# Patient Record
Sex: Female | Born: 1960 | Race: Black or African American | Hispanic: No | Smoking: Never smoker
Health system: Southern US, Community
[De-identification: ages and names within clinical notes are randomized; demographics above are authoritative.]

## PROBLEM LIST (undated history)

## (undated) DIAGNOSIS — R232 Flushing: Secondary | ICD-10-CM

## (undated) DIAGNOSIS — E7800 Pure hypercholesterolemia, unspecified: Secondary | ICD-10-CM

## (undated) DIAGNOSIS — M1990 Unspecified osteoarthritis, unspecified site: Secondary | ICD-10-CM

## (undated) DIAGNOSIS — K635 Polyp of colon: Secondary | ICD-10-CM

## (undated) DIAGNOSIS — R413 Other amnesia: Secondary | ICD-10-CM

## (undated) DIAGNOSIS — E039 Hypothyroidism, unspecified: Secondary | ICD-10-CM

## (undated) DIAGNOSIS — R9389 Abnormal findings on diagnostic imaging of other specified body structures: Secondary | ICD-10-CM

## (undated) HISTORY — DX: Polyp of colon: K63.5

## (undated) HISTORY — DX: Pure hypercholesterolemia, unspecified: E78.00

## (undated) HISTORY — DX: Other amnesia: R41.3

## (undated) HISTORY — DX: Hypothyroidism, unspecified: E03.9

## (undated) HISTORY — DX: Unspecified osteoarthritis, unspecified site: M19.90

## (undated) HISTORY — DX: Flushing: R23.2

## (undated) HISTORY — DX: Abnormal findings on diagnostic imaging of other specified body structures: R93.89

---

## 1997-09-12 ENCOUNTER — Ambulatory Visit (HOSPITAL_COMMUNITY): Admission: RE | Admit: 1997-09-12 | Discharge: 1997-09-12 | Payer: Self-pay | Admitting: Gastroenterology

## 1998-07-01 ENCOUNTER — Other Ambulatory Visit: Admission: RE | Admit: 1998-07-01 | Discharge: 1998-07-01 | Payer: Self-pay | Admitting: Obstetrics and Gynecology

## 1999-02-12 ENCOUNTER — Encounter: Payer: Self-pay | Admitting: Emergency Medicine

## 1999-02-12 ENCOUNTER — Encounter: Admission: RE | Admit: 1999-02-12 | Discharge: 1999-02-12 | Payer: Self-pay | Admitting: Emergency Medicine

## 1999-02-18 ENCOUNTER — Encounter: Admission: RE | Admit: 1999-02-18 | Discharge: 1999-02-18 | Payer: Self-pay | Admitting: Emergency Medicine

## 1999-02-18 ENCOUNTER — Encounter: Payer: Self-pay | Admitting: Emergency Medicine

## 1999-07-01 ENCOUNTER — Other Ambulatory Visit: Admission: RE | Admit: 1999-07-01 | Discharge: 1999-07-01 | Payer: Self-pay | Admitting: Obstetrics and Gynecology

## 2000-04-11 HISTORY — PX: VAGINAL HYSTERECTOMY: SUR661

## 2000-06-01 ENCOUNTER — Encounter: Admission: RE | Admit: 2000-06-01 | Discharge: 2000-06-01 | Payer: Self-pay | Admitting: Emergency Medicine

## 2000-06-01 ENCOUNTER — Encounter: Payer: Self-pay | Admitting: Emergency Medicine

## 2000-11-30 ENCOUNTER — Other Ambulatory Visit: Admission: RE | Admit: 2000-11-30 | Discharge: 2000-11-30 | Payer: Self-pay | Admitting: *Deleted

## 2000-12-06 ENCOUNTER — Ambulatory Visit (HOSPITAL_COMMUNITY): Admission: RE | Admit: 2000-12-06 | Discharge: 2000-12-06 | Payer: Self-pay | Admitting: *Deleted

## 2001-01-22 ENCOUNTER — Encounter (INDEPENDENT_AMBULATORY_CARE_PROVIDER_SITE_OTHER): Payer: Self-pay | Admitting: Specialist

## 2001-01-22 ENCOUNTER — Observation Stay (HOSPITAL_COMMUNITY): Admission: RE | Admit: 2001-01-22 | Discharge: 2001-01-23 | Payer: Self-pay | Admitting: *Deleted

## 2001-06-05 ENCOUNTER — Encounter: Payer: Self-pay | Admitting: Emergency Medicine

## 2001-06-05 ENCOUNTER — Encounter: Admission: RE | Admit: 2001-06-05 | Discharge: 2001-06-05 | Payer: Self-pay | Admitting: Emergency Medicine

## 2002-02-21 ENCOUNTER — Other Ambulatory Visit: Admission: RE | Admit: 2002-02-21 | Discharge: 2002-02-21 | Payer: Self-pay | Admitting: Obstetrics and Gynecology

## 2002-05-30 ENCOUNTER — Encounter: Admission: RE | Admit: 2002-05-30 | Discharge: 2002-05-30 | Payer: Self-pay | Admitting: Emergency Medicine

## 2002-05-30 ENCOUNTER — Encounter: Payer: Self-pay | Admitting: Emergency Medicine

## 2002-06-07 ENCOUNTER — Encounter: Admission: RE | Admit: 2002-06-07 | Discharge: 2002-06-07 | Payer: Self-pay | Admitting: Emergency Medicine

## 2002-06-07 ENCOUNTER — Encounter: Payer: Self-pay | Admitting: Emergency Medicine

## 2002-06-13 ENCOUNTER — Encounter: Payer: Self-pay | Admitting: Emergency Medicine

## 2002-06-13 ENCOUNTER — Encounter: Admission: RE | Admit: 2002-06-13 | Discharge: 2002-06-13 | Payer: Self-pay | Admitting: Emergency Medicine

## 2002-11-15 ENCOUNTER — Ambulatory Visit (HOSPITAL_COMMUNITY): Admission: RE | Admit: 2002-11-15 | Discharge: 2002-11-15 | Payer: Self-pay | Admitting: Gastroenterology

## 2004-01-19 ENCOUNTER — Encounter: Admission: RE | Admit: 2004-01-19 | Discharge: 2004-01-19 | Payer: Self-pay | Admitting: Emergency Medicine

## 2004-06-17 ENCOUNTER — Encounter: Admission: RE | Admit: 2004-06-17 | Discharge: 2004-06-17 | Payer: Self-pay | Admitting: Emergency Medicine

## 2005-04-01 ENCOUNTER — Encounter: Admission: RE | Admit: 2005-04-01 | Discharge: 2005-04-01 | Payer: Self-pay | Admitting: Emergency Medicine

## 2005-04-26 ENCOUNTER — Encounter: Admission: RE | Admit: 2005-04-26 | Discharge: 2005-04-26 | Payer: Self-pay | Admitting: Emergency Medicine

## 2005-08-02 ENCOUNTER — Encounter: Admission: RE | Admit: 2005-08-02 | Discharge: 2005-08-02 | Payer: Self-pay | Admitting: Emergency Medicine

## 2005-08-31 ENCOUNTER — Encounter: Admission: RE | Admit: 2005-08-31 | Discharge: 2005-08-31 | Payer: Self-pay | Admitting: Emergency Medicine

## 2006-09-21 ENCOUNTER — Encounter: Admission: RE | Admit: 2006-09-21 | Discharge: 2006-09-21 | Payer: Self-pay | Admitting: Emergency Medicine

## 2006-12-06 ENCOUNTER — Encounter: Admission: RE | Admit: 2006-12-06 | Discharge: 2006-12-06 | Payer: Self-pay | Admitting: Emergency Medicine

## 2007-01-01 ENCOUNTER — Encounter: Admission: RE | Admit: 2007-01-01 | Discharge: 2007-01-01 | Payer: Self-pay | Admitting: Emergency Medicine

## 2007-01-15 ENCOUNTER — Encounter: Admission: RE | Admit: 2007-01-15 | Discharge: 2007-01-15 | Payer: Self-pay | Admitting: Sports Medicine

## 2007-02-05 ENCOUNTER — Encounter: Admission: RE | Admit: 2007-02-05 | Discharge: 2007-02-05 | Payer: Self-pay | Admitting: Sports Medicine

## 2007-02-28 ENCOUNTER — Encounter: Admission: RE | Admit: 2007-02-28 | Discharge: 2007-02-28 | Payer: Self-pay | Admitting: Sports Medicine

## 2007-07-17 ENCOUNTER — Encounter: Admission: RE | Admit: 2007-07-17 | Discharge: 2007-07-17 | Payer: Self-pay | Admitting: Emergency Medicine

## 2007-10-16 ENCOUNTER — Encounter: Admission: RE | Admit: 2007-10-16 | Discharge: 2007-10-16 | Payer: Self-pay | Admitting: Emergency Medicine

## 2008-10-21 ENCOUNTER — Encounter: Admission: RE | Admit: 2008-10-21 | Discharge: 2008-10-21 | Payer: Self-pay | Admitting: Family Medicine

## 2009-04-30 ENCOUNTER — Encounter: Admission: RE | Admit: 2009-04-30 | Discharge: 2009-04-30 | Payer: Self-pay | Admitting: Emergency Medicine

## 2009-07-17 ENCOUNTER — Ambulatory Visit (HOSPITAL_COMMUNITY): Admission: RE | Admit: 2009-07-17 | Discharge: 2009-07-17 | Payer: Self-pay | Admitting: Internal Medicine

## 2009-10-23 ENCOUNTER — Encounter: Admission: RE | Admit: 2009-10-23 | Discharge: 2009-10-23 | Payer: Self-pay | Admitting: Internal Medicine

## 2009-11-16 ENCOUNTER — Encounter: Payer: Self-pay | Admitting: Cardiology

## 2009-11-28 ENCOUNTER — Encounter: Admission: RE | Admit: 2009-11-28 | Discharge: 2009-11-28 | Payer: Self-pay | Admitting: Internal Medicine

## 2009-11-30 ENCOUNTER — Encounter: Admission: RE | Admit: 2009-11-30 | Discharge: 2009-11-30 | Payer: Self-pay | Admitting: Internal Medicine

## 2010-02-16 ENCOUNTER — Encounter: Payer: Self-pay | Admitting: Cardiology

## 2010-02-26 ENCOUNTER — Ambulatory Visit: Payer: Self-pay | Admitting: Cardiology

## 2010-02-26 DIAGNOSIS — E785 Hyperlipidemia, unspecified: Secondary | ICD-10-CM | POA: Insufficient documentation

## 2010-03-10 ENCOUNTER — Ambulatory Visit: Payer: Self-pay

## 2010-03-10 ENCOUNTER — Ambulatory Visit: Payer: Self-pay | Admitting: Internal Medicine

## 2010-03-10 ENCOUNTER — Ambulatory Visit: Payer: Self-pay | Admitting: Cardiology

## 2010-04-08 ENCOUNTER — Encounter: Payer: Self-pay | Admitting: Cardiology

## 2010-05-02 ENCOUNTER — Encounter: Payer: Self-pay | Admitting: Internal Medicine

## 2010-05-02 ENCOUNTER — Encounter: Payer: Self-pay | Admitting: Emergency Medicine

## 2010-05-11 NOTE — Assessment & Plan Note (Signed)
Summary: np6/ chestpain, eval for stress test. pt has uhc gd   Visit Type:  Initial Consult Primary [place]:  Dr. Marisue Brooklyn  CC:  Syncope and tachypalpitaitons.  History of Present Illness: The patient presents for evaluation of tachycardia palpitations, syncope and chest discomfort. She has no prior cardiac history but has significant dyslipidemia and a strong family history of early coronary disease. 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 She'll get short of breath after about 50 yards.  She will notice an increased heart rate with this as well. With this activity she does not describe chest pressure, neck or arm discomfort. However, she does get some left arm discomfort only when she tries to sleep on her right side. This is worse with movement of that arm. She has had tachycardia palpitations at rest. In particular earlier this month she had an episode of a rapid heart rate while standing at a funeral. She apparently lost consciousness for a couple of minutes. This has not happened to her before. She has otherwise not had presyncope or syncope or orthostasis. She did not seek medical attention at that time.  Current Medications (verified): 1)  Simvastatin 40 Mg Tabs (Simvastatin) .Marland Kitchen.. 1 By Mouth Daily 2)  Levothroid 175 Mcg Tabs (Levothyroxine Sodium) .Marland Kitchen.. 1 By Mouth Daily  Allergies (verified): No Known Drug Allergies  Past History:  Past Medical History: Hypothyroid Hyperlipidemia x 8 years DJD  Past Surgical History: Hysterectomy C section  Family History: Mother MI 26s Father CAD 53s Three sisters.  One with some sort of heart disease.  Social History: Works for the city of [employer], 2 children, 3 children Never smoked  Review of Systems       Positive for headaches, glasses, reflux. Otherwise as stated in the history of present illness negative  for all other systems.  Vital Signs:  Patient profile:   50 year old female Height:      62 inches Weight:      165 pounds BMI:     30.29 Pulse rate:   68 / minute Resp:     16 per minute BP sitting:   106 / 66  (right arm)  Vitals Entered By: Marrion Coy, CNA (February 26, 2010 10:44 AM)  Physical Exam  General:  Well developed, well nourished, in no acute distress. Head:  normocephalic and atraumatic Eyes:  PERRLA/EOM intact; conjunctiva and lids normal. Mouth:  Teeth, gums and palate normal. Oral mucosa normal. Neck:  Neck supple, no JVD. No masses, thyromegaly or abnormal cervical nodes. Chest Wall:  no deformities or breast masses noted Lungs:  Clear bilaterally to auscultation and percussion. Heart:  Non-displaced PMI, chest non-tender; regular rate and rhythm, S1, S2 without murmurs, rubs or gallops. Carotid upstroke normal, no bruit. Normal abdominal aortic size, no bruits. Femorals normal pulses, no bruits. Pedals normal pulses. No edema, no varicosities. Abdomen:  Bowel sounds positive; abdomen soft and non-tender without masses, organomegaly, or hernias noted. No hepatosplenomegaly. Msk:  Back normal, normal gait. Muscle strength and tone normal. Pulses:  pulses normal in all 4 extremities Extremities:  No clubbing or cyanosis. Neurologic:  Alert and oriented x 3. Skin:  Intact without lesions or rashes. Cervical Nodes:  no significant adenopathy Axillary Nodes:  no significant adenopathy Inguinal Nodes:  no significant adenopathy Psych:  Normal affect.   EKG  Procedure date:  02/16/2010  Findings:      Sinus rhythm, rate 60, axis within normal limits, intervals within normal limits, RSR' V1 and V2, lateral T-wave  without old EKG for comparison.  Impression & Recommendations:  Problem # 1:  PRECORDIAL PAIN (ICD-786.51) The patient has chest pain and dyspnea. The pain is atypical. However, she does have a slightly abnormal EKG and a strong family history of  early heart disease. Given this exercise treadmill testing is indicated. We also began a discussion of aggressive primary risk reduction. Orders: Treadmill (Treadmill)  Problem # 2:  PALPITATIONS (ICD-785.1) I will apply a 48 hour Holter monitor. I did review labs and she is hypothyroid which is being adjusted. Orders: Holter (Holter) Treadmill (Treadmill)  Problem # 3:  DYSLIPIDEMIA (ICD-272.4) She has a significant dyslipidemia and I reviewed her labs with an LDL of 217. Apparently she was not taking Zocor and this was checked. There is a followup lab that has been drawn but she does not know the results. I impressed upon her the importance of treatment in her situation.  Patient Instructions: 1)  Your physician recommends that you schedule a follow-up appointment at the time of your treadmill (maybe with Dr Antoine Poche if can be done with him within the next 2 weeks/with PA if not). 2)  Schedule an appointment to see DrHochrein back after holter. 3)  Your physician recommends that you continue on your current medications as directed. Please refer to the Current Medication list given to you today. 4)  Your physician has recommended that you wear a holter monitor for 48 hours.  Please have placed same day as treadmill. Holter monitors are medical devices that record the heart's electrical activity. Doctors most often use these monitors to diagnose arrhythmias. Arrhythmias are problems with the speed or rhythm of the heartbeat. The monitor is a small, portable device. You can wear one while you do your normal daily activities. This is usually used to diagnose what is causing palpitations/syncope (passing out). 5)  Your physician has requested that you have an exercise tolerance test.  For further information please visit [email or web address]  Please also follow instruction sheet, as given. 6)  You have been diagnosed with palpitations. Palpitations are described as a gradual or sudden awareness of  the beating of your heart. It may last seconds, minutes, hours, or days and may be caused by the heart beating slower, faster, more strongly, or more irregularly than normal. They are very common and usually not dangerous. Please see the handout/brochure given to you today for more information.

## 2010-05-13 NOTE — Letter (Signed)
Summary: [facility]   [facility] Note   Imported By: Roderic Ovens 03/23/2010 16:25:56  _____________________________________________________________________  External Attachment:    Type:   Image     Comment:   External Document

## 2010-05-13 NOTE — Procedures (Signed)
Summary: summary report  summary report   Imported By: Mirna Mires 04/08/2010 15:31:10  _____________________________________________________________________  External Attachment:    Type:   Image     Comment:   External Document

## 2010-08-27 NOTE — Discharge Summary (Signed)
[facility]  Patient:    TAMIKA, SHROPSHIRE Visit Number: [id number] MRN: [id number]          Service Type: SUR Location: 4W 0456 01 Attending Physician:  Marin Comment Dictated by:   Pershing Cox, M.D. Admit Date:  01/22/2001 Discharge Date: 01/23/2001                             Discharge Summary  ADMITTING DIAGNOSES:  Pelvic mass, multiple uterine myomas, and pelvic pain.  OPERATIVE PROCEDURE:  Laparoscopic-assisted vaginal hysterectomy.  For details of the patients admission history and physical, please see the note dated January 22, 2001.  HOSPITAL COURSE:  On the day of admission, patient was taken to the operating room and, under general anesthesia, laparoscopic LAVH was performed.  There were no intraoperative complications.  The uterus was morcellized to remove it.  On the evening of postoperative day #1, the patients pain was adequately controlled.  She was given a discharge instruction sheet and was able to start on p.o.  On the morning of postoperative day #1, she was a little wobbly when getting out of bed but able to get up and move around.  Blood pressure was only 90/49.  Room air saturation was 97.  She was given a regular diet and prescriptions for Darvocet-N 100 and Chromagen.  Hemoglobin was 9.8 and had been 12.3 preoperatively.  Her postoperative course was felt to be stable and she was able to be discharged after lunch on postoperative day #1.  FINAL DIAGNOSIS:  Pelvic pain and multiple uterine leiomyomas.  CONDITION AT TIME OF DISCHARGE:  Stable. Dictated by:   Pershing Cox, M.D. Attending Physician:  Marin Comment DD:  02/02/01 TD:  02/04/01 Job: [id number]

## 2010-08-27 NOTE — Op Note (Signed)
[name], [name]                          ACCOUNT NO.:  [id number]   MEDICAL RECORD NO.:  [id number]                   PATIENT TYPE:  AMB   LOCATION:  ENDO                                 FACILITY:  [facility]   PHYSICIAN:  Anselmo Rod, M.D.               DATE OF BIRTH:  [birth year]   DATE OF PROCEDURE:  11/15/2002  DATE OF DISCHARGE:                                 OPERATIVE REPORT   PROCEDURE PERFORMED:  Screening colonoscopy.   ENDOSCOPIST:  Anselmo Rod, M.D.   INSTRUMENT USED:  Olympus video colonoscope.   INDICATION FOR PROCEDURE:  A 50 year old African-American female with a  history of chronic constipation, a personal history of colonic polyps, and a  family history of breast cancer, a questionable family history of colon  cancer in her mother, undergoing a screening colonoscopy.  Rule out colonic  polyps, masses, etc.   PREPROCEDURE PREPARATION:  Informed consent was procured from the patient.  The patient was prepped with two gallons of GoLYTELY the night prior to the  procedure and a bottle of magnesium citrate.  Along with the GoLYTELY she  was fasted for eight hours prior to the procedure.   PREPROCEDURE PHYSICAL:  VITAL SIGNS:  The patient had stable vital signs.  NECK:  Supple.  CHEST:  Clear to auscultation.  S1, S2 regular.  ABDOMEN:  Soft with normal bowel sounds.   DESCRIPTION OF PROCEDURE:  The patient was placed in the left lateral  decubitus position and sedated with 70 mg of Demerol and 7  mg of Versed  intravenously.  Once the patient was adequately sedate and maintained on low-  flow oxygen and continuous cardiac monitoring, the Olympus video colonoscope  was advanced from the rectum to the cecum and terminal ileum without  difficulty.  The patient had some residual stool in the colon.  Multiple  washes were done.  No masses, polyps, erosions, ulcerations, or diverticula  were seen.  The appendiceal orifice and the ileocecal valve were  clearly  identified and photographed.  The terminal ileum appeared normal.  Small  internal hemorrhoids were appreciated on retroflexion in the rectum.  The  patient tolerated the procedure well without complication.   IMPRESSION:  1. Normal colonoscopy to the terminal ileum except for small internal     hemorrhoids.  2. No masses, polyps, or diverticulosis noted.   RECOMMENDATIONS:  1. A high-fiber diet with liberal fluid intake has been advised.  2. Repeat colorectal cancer screening is recommended in the next five years     unless the patient develops any     abnormal symptoms in the interim.  3. Trial of Zelnorm when the patient comes to the office on her next visit.  4. Outpatient follow-up in the next two weeks or earlier if need be.  Anselmo Rod, M.D.    JNM/MEDQ  D:  11/15/2002  T:  11/16/2002  Job:  [id number]   cc:   Reuben Likes, M.D.  [address]  [place]  [place] [address]  Fax: [phone]

## 2010-09-24 ENCOUNTER — Other Ambulatory Visit: Payer: Self-pay | Admitting: Internal Medicine

## 2010-09-24 DIAGNOSIS — Z1231 Encounter for screening mammogram for malignant neoplasm of breast: Secondary | ICD-10-CM

## 2010-10-25 ENCOUNTER — Ambulatory Visit
Admission: RE | Admit: 2010-10-25 | Discharge: 2010-10-25 | Disposition: A | Discharge status: Home / Self Care | Payer: 59 | Source: Ambulatory Visit | Attending: Internal Medicine | Admitting: Internal Medicine

## 2010-10-25 DIAGNOSIS — Z1231 Encounter for screening mammogram for malignant neoplasm of breast: Secondary | ICD-10-CM

## 2010-12-20 ENCOUNTER — Other Ambulatory Visit (HOSPITAL_COMMUNITY): Payer: Self-pay | Admitting: Internal Medicine

## 2010-12-20 DIAGNOSIS — M8580 Other specified disorders of bone density and structure, unspecified site: Secondary | ICD-10-CM

## 2011-09-28 ENCOUNTER — Other Ambulatory Visit: Payer: Self-pay | Admitting: Emergency Medicine

## 2011-09-28 DIAGNOSIS — Z1231 Encounter for screening mammogram for malignant neoplasm of breast: Secondary | ICD-10-CM

## 2011-10-26 ENCOUNTER — Ambulatory Visit
Admission: RE | Admit: 2011-10-26 | Discharge: 2011-10-26 | Disposition: A | Discharge status: Home / Self Care | Payer: 59 | Source: Ambulatory Visit | Attending: Emergency Medicine | Admitting: Emergency Medicine

## 2011-10-26 DIAGNOSIS — Z1231 Encounter for screening mammogram for malignant neoplasm of breast: Secondary | ICD-10-CM

## 2012-09-17 ENCOUNTER — Other Ambulatory Visit: Payer: Self-pay

## 2012-09-17 DIAGNOSIS — Z1231 Encounter for screening mammogram for malignant neoplasm of breast: Secondary | ICD-10-CM

## 2012-10-26 ENCOUNTER — Ambulatory Visit
Admission: RE | Admit: 2012-10-26 | Discharge: 2012-10-26 | Disposition: A | Discharge status: Home / Self Care | Payer: 59 | Source: Ambulatory Visit

## 2012-10-26 DIAGNOSIS — Z1231 Encounter for screening mammogram for malignant neoplasm of breast: Secondary | ICD-10-CM

## 2013-02-11 ENCOUNTER — Other Ambulatory Visit: Payer: Self-pay | Admitting: Internal Medicine

## 2013-02-11 ENCOUNTER — Other Ambulatory Visit (HOSPITAL_COMMUNITY)
Admission: RE | Admit: 2013-02-11 | Discharge: 2013-02-11 | Disposition: A | Discharge status: Home / Self Care | Payer: 59 | Source: Ambulatory Visit | Attending: Internal Medicine | Admitting: Internal Medicine

## 2013-02-11 DIAGNOSIS — Z01419 Encounter for gynecological examination (general) (routine) without abnormal findings: Secondary | ICD-10-CM | POA: Insufficient documentation

## 2013-02-11 LAB — CBC WITH DIFFERENTIAL/PLATELET
Basophils Relative: 1 % (ref 0–1)
Eosinophils Absolute: 0 10*3/uL (ref 0.0–0.7)
Eosinophils Relative: 1 % (ref 0–5)
Hemoglobin: 13.2 g/dL (ref 12.0–15.0)
Lymphocytes Relative: 51 % — ABNORMAL HIGH (ref 12–46)
Lymphs Abs: 4.4 10*3/uL — ABNORMAL HIGH (ref 0.7–4.0)
MCH: 29.3 pg (ref 26.0–34.0)
MCHC: 34.6 g/dL (ref 30.0–36.0)
MCV: 84.9 fL (ref 78.0–100.0)
Monocytes Relative: 7 % (ref 3–12)
Neutrophils Relative %: 40 % — ABNORMAL LOW (ref 43–77)
RBC: 4.5 MIL/uL (ref 3.87–5.11)
RDW: 14.5 % (ref 11.5–15.5)

## 2013-02-12 LAB — URINALYSIS, ROUTINE W REFLEX MICROSCOPIC
Bilirubin Urine: NEGATIVE
Glucose, UA: NEGATIVE mg/dL
Ketones, ur: NEGATIVE mg/dL
Protein, ur: NEGATIVE mg/dL
Specific Gravity, Urine: 1.017 (ref 1.005–1.030)
Urobilinogen, UA: 0.2 mg/dL (ref 0.0–1.0)

## 2013-02-12 LAB — HEPATIC FUNCTION PANEL
ALT: 20 U/L (ref 0–35)
AST: 18 U/L (ref 0–37)
Albumin: 4.9 g/dL (ref 3.5–5.2)
Indirect Bilirubin: 0.4 mg/dL (ref 0.0–0.9)
Total Bilirubin: 0.5 mg/dL (ref 0.3–1.2)
Total Protein: 7.8 g/dL (ref 6.0–8.3)

## 2013-02-12 LAB — LIPID PANEL
Cholesterol: 175 mg/dL (ref 0–200)
HDL: 48 mg/dL (ref >39)
LDL Cholesterol: 101 mg/dL — ABNORMAL HIGH (ref 0–99)
Total CHOL/HDL Ratio: 3.6 Ratio
Triglycerides: 130 mg/dL (ref <150)
VLDL: 26 mg/dL (ref 0–40)

## 2013-02-12 LAB — HEMOGLOBIN A1C
Hgb A1c MFr Bld: 6.2 % — ABNORMAL HIGH (ref <5.7)
Mean Plasma Glucose: 131 mg/dL — ABNORMAL HIGH (ref <117)

## 2013-02-12 LAB — BASIC METABOLIC PANEL WITH GFR
BUN: 11 mg/dL (ref 6–23)
Calcium: 10.5 mg/dL (ref 8.4–10.5)
Chloride: 99 mEq/L (ref 96–112)
Creat: 1.01 mg/dL (ref 0.50–1.10)
GFR, Est African American: 73 mL/min
Glucose, Bld: 83 mg/dL (ref 70–99)
Sodium: 141 mEq/L (ref 135–145)

## 2013-02-12 LAB — MAGNESIUM: Magnesium: 1.9 mg/dL (ref 1.5–2.5)

## 2013-02-12 LAB — MICROALBUMIN / CREATININE URINE RATIO: Microalb, Ur: 0.51 mg/dL (ref 0.00–1.89)

## 2013-02-12 LAB — URINALYSIS, MICROSCOPIC ONLY: Bacteria, UA: NONE SEEN

## 2013-02-12 LAB — VITAMIN D 25 HYDROXY (VIT D DEFICIENCY, FRACTURES): Vit D, 25-Hydroxy: 49 ng/mL (ref 30–89)

## 2013-02-14 ENCOUNTER — Other Ambulatory Visit: Payer: Self-pay | Admitting: Emergency Medicine

## 2013-02-14 DIAGNOSIS — N390 Urinary tract infection, site not specified: Secondary | ICD-10-CM

## 2013-02-14 LAB — URINE CULTURE: Colony Count: 80000

## 2013-02-15 ENCOUNTER — Other Ambulatory Visit: Payer: Self-pay | Admitting: Emergency Medicine

## 2013-02-15 DIAGNOSIS — R51 Headache: Secondary | ICD-10-CM

## 2013-02-25 ENCOUNTER — Telehealth: Payer: Self-pay | Admitting: *Deleted

## 2013-02-25 NOTE — Telephone Encounter (Signed)
DOB 10/04/1960    Pt is calling says she has a scan scheduled for tomorrow wants to know can we call in something to help her through scan?  She says she is very  closterfobic ? Call to [facility] [address]  [phone] Fax [phone]

## 2013-02-26 ENCOUNTER — Ambulatory Visit
Admission: RE | Admit: 2013-02-26 | Discharge: 2013-02-26 | Disposition: A | Discharge status: Home / Self Care | Payer: 59 | Source: Ambulatory Visit | Attending: Emergency Medicine | Admitting: Emergency Medicine

## 2013-02-26 DIAGNOSIS — R51 Headache: Secondary | ICD-10-CM

## 2013-02-26 MED ORDER — GADOBENATE DIMEGLUMINE 529 MG/ML IV SOLN
15.0000 mL | Freq: Once | INTRAVENOUS | Status: AC | PRN
  Administered 2013-02-26: 15 mL via INTRAVENOUS

## 2013-02-27 ENCOUNTER — Other Ambulatory Visit: Payer: Self-pay | Admitting: Emergency Medicine

## 2013-02-27 DIAGNOSIS — R51 Headache: Secondary | ICD-10-CM

## 2013-02-27 NOTE — Telephone Encounter (Signed)
Message copied by Joya Martyr on Wed Feb 27, 2013 11:32 AM ------      Message from: Loree Fee R      Created: Wed Feb 27, 2013  3:37 AM       There is an area of concern as previously mentioned needs ref to neuro for evaluation and comparison of old MRI to see if any change. ASK patient if she has seen Neurology before if so refer to previous, if not need new referal ------

## 2013-03-06 ENCOUNTER — Telehealth: Payer: Self-pay | Admitting: Internal Medicine

## 2013-03-06 NOTE — Telephone Encounter (Signed)
Pt received samples of medicine for hot flashes and would like an rx for them.  Pharm: [facility]  [address]  Next appt:03-18-13 lab only   OV 06-05-13 Loree Fee

## 2013-03-12 NOTE — Telephone Encounter (Signed)
I need chart for this patient I have looked and cannot find it. Please and thanks.

## 2013-03-14 NOTE — Telephone Encounter (Signed)
Please call patient and see if she wants Branded or willing to try generic Paxil 10 mg.

## 2013-03-15 ENCOUNTER — Other Ambulatory Visit: Payer: Self-pay | Admitting: Emergency Medicine

## 2013-03-15 DIAGNOSIS — N390 Urinary tract infection, site not specified: Secondary | ICD-10-CM

## 2013-03-18 ENCOUNTER — Other Ambulatory Visit: Payer: Self-pay | Admitting: Emergency Medicine

## 2013-03-18 ENCOUNTER — Other Ambulatory Visit: Payer: 59

## 2013-03-18 ENCOUNTER — Telehealth: Payer: Self-pay | Admitting: Internal Medicine

## 2013-03-18 DIAGNOSIS — N390 Urinary tract infection, site not specified: Secondary | ICD-10-CM

## 2013-03-18 NOTE — Telephone Encounter (Signed)
Pt returning call [phone]

## 2013-03-19 ENCOUNTER — Other Ambulatory Visit: Payer: Self-pay | Admitting: Internal Medicine

## 2013-03-19 ENCOUNTER — Encounter: Payer: Self-pay | Admitting: Neurology

## 2013-03-19 ENCOUNTER — Ambulatory Visit (INDEPENDENT_AMBULATORY_CARE_PROVIDER_SITE_OTHER): Payer: 59 | Admitting: Neurology

## 2013-03-19 ENCOUNTER — Encounter (INDEPENDENT_AMBULATORY_CARE_PROVIDER_SITE_OTHER): Payer: Self-pay

## 2013-03-19 VITALS — BP 127/77 | HR 63 | Ht 63.5 in | Wt 165.0 lb

## 2013-03-19 DIAGNOSIS — E785 Hyperlipidemia, unspecified: Secondary | ICD-10-CM

## 2013-03-19 DIAGNOSIS — R930 Abnormal findings on diagnostic imaging of skull and head, not elsewhere classified: Secondary | ICD-10-CM

## 2013-03-19 DIAGNOSIS — R002 Palpitations: Secondary | ICD-10-CM

## 2013-03-19 DIAGNOSIS — R072 Precordial pain: Secondary | ICD-10-CM

## 2013-03-19 LAB — URINALYSIS, MICROSCOPIC ONLY
Bacteria, UA: NONE SEEN
Crystals: NONE SEEN

## 2013-03-19 LAB — URINALYSIS, ROUTINE W REFLEX MICROSCOPIC
Ketones, ur: NEGATIVE mg/dL
Nitrite: NEGATIVE
Specific Gravity, Urine: 1.019 (ref 1.005–1.030)
Urobilinogen, UA: 0.2 mg/dL (ref 0.0–1.0)

## 2013-03-19 MED ORDER — PAROXETINE HCL 10 MG PO TABS: 10.0000 mg | ORAL_TABLET | Freq: Every day | ORAL | Status: DC

## 2013-03-19 NOTE — Progress Notes (Signed)
[facility]  PATIENT: Vicki Wright DOB: 01-08-1961  HISTORICAL  Siyah is a 52 Yo left-handed African American Female, referred by her primary care physician Loree Fee for evaluation of headaches, abnormal MRI scan,  She had a past medical history of hyperlipidemia, hypothyroidism, on supplement  Since her early twenties, she began to have intermittent headaches, most of her headache starting from left occipital region, spreading to vortex region, in recent couple weeks, she began to experience similar headaches, usually very short lasting, transient, last than 1 minutes, there was no significant limitation to her daily activity  However she reported a history of previous abnormal MRI scan, there was an MRI report dated 2004, it was taken for complains of headaches, memory loss, 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  There seems to be no apparent change in comparison to previous MRIs report  REVIEW OF SYSTEMS: Full 14 system review of systems performed and notable only for blurry vision, blood in stool, easy bruising, feeling hot, flushing, ringing ears, nose, memory loss, headaches,  ALLERGIES: No Known Allergies  HOME MEDICATIONS: No outpatient prescriptions prior to visit.   No facility-administered medications prior to visit.    PAST MEDICAL HISTORY: Past Medical History  Diagnosis Date  . Hypothyroid   . Hypercholesteremia   . Hot flashes     PAST SURGICAL HISTORY: Past  Surgical History  Procedure Laterality Date  . Cesarean section  52 & 52  . Vaginal hysterectomy  2002    FAMILY HISTORY: Family History  Problem Relation Age of Onset  . Cancer ,colon Mother     SOCIAL HISTORY:  History   Social History  . Marital Status: Divorced    Spouse Name: N/A    Number of Children: 2  . Years of Education: 12   Occupational History    [employer] [place], deliver mail   Social History Main Topics  . Smoking status: Never Smoker   . Smokeless tobacco: Never Used  . Alcohol Use: No  . Drug Use: No  . Sexual Activity: Not on file   Other Topics Concern  . Not on file   Social History Narrative   Patient is single and her daughter and family live with her.   Patient is working full-time.   Patient has a high school education.   Patient has two children.   Patient is left-handed.   Patient does drink maybe one coke once a week.     PHYSICAL EXAM   Filed Vitals:   03/19/13 0842  BP: 127/77  Pulse: 63  Height: 5' 3.5" (1.613 m)  Weight: 165 lb (74.844 kg)    Body mass index is 28.77 kg/(m^2).   Generalized: In no acute distress  Neck: Supple, no carotid bruits   Cardiac: Regular rate rhythm  Pulmonary: Clear to auscultation bilaterally  Musculoskeletal: No deformity  Neurological examination  Mentation: Alert oriented to time, place, history taking, and causual conversation  Cranial nerve II-XII: Pupils  were equal round reactive to light extraocular movements were full, Visual field were full on confrontational test. Bilateral fundi were sharp.  Facial sensation and strength were normal. Hearing was intact to finger rubbing bilaterally. Uvula tongue midline.  head turning and shoulder shrug and were normal and symmetric.Tongue protrusion into cheek strength was normal.  Motor: normal tone, bulk and strength.  Sensory: Intact to fine touch, pinprick, preserved vibratory sensation, and proprioception at toes.  Coordination:  Normal finger to nose, heel-to-shin bilaterally there was no truncal ataxia  Gait: Rising up from seated position without assistance, normal stance, without trunk ataxia, moderate stride, good arm swing, smooth turning, able to perform tiptoe, and heel walking without difficulty.   Romberg signs: Negative  Deep tendon reflexes: Brachioradialis 2/2, biceps 2/2, triceps 2/2, patellar 2/2, Achilles 2/2, plantar responses were flexor bilaterally.   DIAGNOSTIC DATA (LABS, IMAGING, TESTING) - I reviewed patient records, labs, notes, testing and imaging myself where available.  Lab Results  Component Value Date   WBC 8.7 02/11/2013   HGB 13.2 02/11/2013   HCT 38.2 02/11/2013   MCV 84.9 02/11/2013   PLT 296 02/11/2013      Component Value Date/Time   NA 141 02/11/2013 1516   K 3.9 02/11/2013 1516   CL 99 02/11/2013 1516   CO2 30 02/11/2013 1516   GLUCOSE 83 02/11/2013 1516   BUN 11 02/11/2013 1516   CREATININE 1.01 02/11/2013 1516   CALCIUM 10.5 02/11/2013 1516   PROT 7.8 02/11/2013 1516   ALBUMIN 4.9 02/11/2013 1516   AST 18 02/11/2013 1516   ALT 20 02/11/2013 1516   ALKPHOS 47 02/11/2013 1516   BILITOT 0.5 02/11/2013 1516   Lab Results  Component Value Date   CHOL 175 02/11/2013   HDL 48 02/11/2013   LDLCALC 101* 02/11/2013   TRIG 130 02/11/2013   CHOLHDL 3.6 02/11/2013   Lab Results  Component Value Date   HGBA1C 6.2* 02/11/2013   No results found for this basename: VITAMINB12   Lab Results  Component Value Date   TSH 0.497 02/11/2013      ASSESSMENT AND PLAN   52 years old [place] American female, with intermittent left-sided headaches, now with abnormal MRI scan, I have review the film, there is an 11 x 7 mm  well-circumscribed focus of T2 hyperintensity slightly right of midline at the level of the pulvinar. There is no significant change compared to previous MRI description dated 2004,  I have advised patient continue to observe her symptoms, only return to clinic if new issue has  arise,   Levert Feinstein M.D. Ph.D.  [facility] [address], [address] [place], [place] [address] [phone]

## 2013-03-20 ENCOUNTER — Other Ambulatory Visit: Payer: Self-pay | Admitting: Internal Medicine

## 2013-03-20 DIAGNOSIS — Z1212 Encounter for screening for malignant neoplasm of rectum: Secondary | ICD-10-CM

## 2013-03-20 LAB — URINE CULTURE: Colony Count: 40000

## 2013-03-20 LAB — POC HEMOCCULT BLD/STL (HOME/3-CARD/SCREEN)
Card #3 Fecal Occult Blood, POC: NEGATIVE
Fecal Occult Blood, POC: NEGATIVE

## 2013-03-20 NOTE — Telephone Encounter (Signed)
Message copied by Nicholaus Corolla A on Wed Mar 20, 2013  9:08 AM ------      Message from: Olivia, [place] R      Created: Wed Mar 20, 2013  4:22 AM       Questionable infection if no symptoms probably not a clean catch. Needs to increase H2O. Call if any UTI symptoms for ABX ------

## 2013-04-15 ENCOUNTER — Other Ambulatory Visit: Payer: Self-pay | Admitting: Internal Medicine

## 2013-04-15 MED ORDER — LEVOTHYROXINE SODIUM 150 MCG PO TABS: 150.0000 ug | ORAL_TABLET | Freq: Every day | ORAL | Status: DC

## 2013-04-30 ENCOUNTER — Other Ambulatory Visit: Payer: Self-pay | Admitting: Emergency Medicine

## 2013-04-30 MED ORDER — SIMVASTATIN 40 MG PO TABS: 40.0000 mg | ORAL_TABLET | Freq: Every day | ORAL | Status: DC

## 2013-05-31 ENCOUNTER — Encounter: Payer: Self-pay | Admitting: *Deleted

## 2013-05-31 DIAGNOSIS — K635 Polyp of colon: Secondary | ICD-10-CM | POA: Insufficient documentation

## 2013-05-31 DIAGNOSIS — Z86010 Personal history of colonic polyps: Secondary | ICD-10-CM | POA: Insufficient documentation

## 2013-05-31 DIAGNOSIS — M1990 Unspecified osteoarthritis, unspecified site: Secondary | ICD-10-CM | POA: Insufficient documentation

## 2013-06-05 ENCOUNTER — Encounter: Payer: Self-pay | Admitting: Emergency Medicine

## 2013-06-05 ENCOUNTER — Ambulatory Visit (INDEPENDENT_AMBULATORY_CARE_PROVIDER_SITE_OTHER): Payer: 59 | Admitting: Emergency Medicine

## 2013-06-05 VITALS — BP 122/64 | HR 68 | Temp 98.2°F | Resp 18 | Ht 63.5 in | Wt 164.0 lb

## 2013-06-05 DIAGNOSIS — E782 Mixed hyperlipidemia: Secondary | ICD-10-CM

## 2013-06-05 DIAGNOSIS — R7309 Other abnormal glucose: Secondary | ICD-10-CM

## 2013-06-05 DIAGNOSIS — R6889 Other general symptoms and signs: Secondary | ICD-10-CM

## 2013-06-05 LAB — CBC WITH DIFFERENTIAL/PLATELET
Basophils Absolute: 0 10*3/uL (ref 0.0–0.1)
Basophils Relative: 0 % (ref 0–1)
EOS ABS: 0.1 10*3/uL (ref 0.0–0.7)
Eosinophils Relative: 1 % (ref 0–5)
HCT: 36.4 % (ref 36.0–46.0)
HEMOGLOBIN: 12.2 g/dL (ref 12.0–15.0)
LYMPHS ABS: 4.4 10*3/uL — AB (ref 0.7–4.0)
LYMPHS PCT: 55 % — AB (ref 12–46)
MCH: 28 pg (ref 26.0–34.0)
MCHC: 33.5 g/dL (ref 30.0–36.0)
MCV: 83.5 fL (ref 78.0–100.0)
Monocytes Absolute: 0.5 10*3/uL (ref 0.1–1.0)
Monocytes Relative: 6 % (ref 3–12)
NEUTROS PCT: 38 % — AB (ref 43–77)
Neutro Abs: 3 10*3/uL (ref 1.7–7.7)
Platelets: 266 10*3/uL (ref 150–400)
RBC: 4.36 MIL/uL (ref 3.87–5.11)
RDW: 14.7 % (ref 11.5–15.5)
WBC: 8 10*3/uL (ref 4.0–10.5)

## 2013-06-05 NOTE — Progress Notes (Signed)
Subjective:    Patient ID: Vicki Wright, female    DOB: 05/08/1960, 53 y.o.   MRN: [id number]  HPI Comments: 53 yo female presents for 3 month F/U for Cholesterol, Pre-Dm, D. Deficient and abnormal urine labs. She was not treated for UTi with questionable Lab without any symptoms previously or currently. She is doing well overall. She is eating better and exercising more. LAST LABS CHOL         175   02/11/2013 HDL           48   02/11/2013 LDLCALC      101   02/11/2013 TRIG         130   02/11/2013 CHOLHDL      3.6   02/11/2013 ALT           20   02/11/2013 AST           18   02/11/2013 ALKPHOS       47   02/11/2013 BILITOT      0.5   02/11/2013 CREATININE     1.01   02/11/2013 BUN              11   02/11/2013 NA              141   02/11/2013 K               3.9   02/11/2013 CL               99   02/11/2013 CO2              30   02/11/2013 HGBA1C      6.2   02/11/2013 Vitamin D 49 Last Urine ? UTI with leukocytes   Hyperlipidemia   Current Outpatient Prescriptions on File Prior to Visit  Medication Sig Dispense Refill  . Calcium Carbonate (CALCIUM 600 PO) Take by mouth daily.      . Cholecalciferol (VITAMIN D3) 5000 UNITS TABS Take 5,000 Units by mouth daily.      . simvastatin (ZOCOR) 40 MG tablet Take 1 tablet (40 mg total) by mouth daily.  90 tablet  0  . vitamin E (VITAMIN E) 400 UNIT capsule Take 400 Units by mouth daily.      . Zinc 50 MG TABS Take by mouth daily.      . Magnesium 250 MG TABS Take by mouth daily.       No current facility-administered medications on file prior to visit.   No Known Allergies Past Medical History  Diagnosis Date  . Hypothyroid   . Hypercholesteremia   . Hot flashes   . Osteoarthritis   . Colon polyp       Review of Systems  All other systems reviewed and are negative.   BP 122/64  Pulse 68  Temp(Src) 98.2 F (36.8 C) (Temporal)  Resp 18  Ht 5' 3.5" (1.613 m)  Wt 164 lb (74.39 kg)  BMI 28.59 kg/m2     Objective:   Physical  Exam  Nursing note and vitals reviewed. Constitutional: She is oriented to person, place, and time. She appears well-developed and well-nourished. No distress.  HENT:  Head: Normocephalic and atraumatic.  Right Ear: External ear normal.  Left Ear: External ear normal.  Nose: Nose normal.  Mouth/Throat: Oropharynx is clear and moist.  Eyes: Conjunctivae and EOM are normal.  Neck: Normal range of motion. Neck supple. No JVD present. No thyromegaly present.  Cardiovascular:  Normal rate, regular rhythm, normal heart sounds and intact distal pulses.   Pulmonary/Chest: Effort normal and breath sounds normal.  Abdominal: Soft. Bowel sounds are normal. She exhibits no distension and no mass. There is no tenderness. There is no rebound and no guarding.  Musculoskeletal: Normal range of motion. She exhibits no edema and no tenderness.  Lymphadenopathy:    She has no cervical adenopathy.  Neurological: She is alert and oriented to person, place, and time. No cranial nerve deficit.  Skin: Skin is warm and dry. No rash noted. No erythema. No pallor.  Psychiatric: She has a normal mood and affect. Her behavior is normal. Judgment and thought content normal.          Assessment & Plan:  1.  3 month F/U for Cholesterol, Pre-Dm, D. Deficient. Needs healthy diet, cardio QD and obtain healthy weight. Check Labs, Check BP if >130/80 call office 2. ? UTI with last Urine labs- recheck w/o symptoms or treatment

## 2013-06-05 NOTE — Patient Instructions (Signed)
PREDiabetes and Exercise Exercising regularly is important. It is not just about losing weight. It has many health benefits, such as:  Improving your overall fitness, flexibility, and endurance.  Increasing your bone density.  Helping with weight control.  Decreasing your body fat.  Increasing your muscle strength.  Reducing stress and tension.  Improving your overall health. People with diabetes who exercise gain additional benefits because exercise:  Reduces appetite.  Improves the body's use of blood sugar (glucose).  Helps lower or control blood glucose.  Decreases blood pressure.  Helps control blood lipids (such as cholesterol and triglycerides).  Improves the body's use of the hormone insulin by:  Increasing the body's insulin sensitivity.  Reducing the body's insulin needs.  Decreases the risk for heart disease because exercising:  Lowers cholesterol and triglycerides levels.  Increases the levels of good cholesterol (such as high-density lipoproteins [HDL]) in the body.  Lowers blood glucose levels. YOUR ACTIVITY PLAN  Choose an activity that you enjoy and set realistic goals. Your health care provider or diabetes educator can help you make an activity plan that works for you. You can break activities into 2 or 3 sessions throughout the day. Doing so is as good as one long session. Exercise ideas include:  Taking the dog for a walk.  Taking the stairs instead of the elevator.  Dancing to your favorite song.  Doing your favorite exercise with a friend. RECOMMENDATIONS FOR EXERCISING WITH TYPE 1 OR TYPE 2 DIABETES   Check your blood glucose before exercising. If blood glucose levels are greater than 240 mg/dL, check for urine ketones. Do not exercise if ketones are present.  Avoid injecting insulin into areas of the body that are going to be exercised. For example, avoid injecting insulin into:  The arms when playing tennis.  The legs when  jogging.  Keep a record of:  Food intake before and after you exercise.  Expected peak times of insulin action.  Blood glucose levels before and after you exercise.  The type and amount of exercise you have done.  Review your records with your health care provider. Your health care provider will help you to develop guidelines for adjusting food intake and insulin amounts before and after exercising.  If you take insulin or oral hypoglycemic agents, watch for signs and symptoms of hypoglycemia. They include:  Dizziness.  Shaking.  Sweating.  Chills.  Confusion.  Drink plenty of water while you exercise to prevent dehydration or heat stroke. Body water is lost during exercise and must be replaced.  Talk to your health care provider before starting an exercise program to make sure it is safe for you. Remember, almost any type of activity is better than none. Document Released: 06/18/2003 Document Revised: 11/28/2012 Document Reviewed: 09/04/2012 [facility] Patient Information 2014 [place], [place]. Urinary Tract Infection A urinary tract infection (UTI) can occur any place along the urinary tract. The tract includes the kidneys, ureters, bladder, and urethra. A type of germ called bacteria often causes a UTI. UTIs are often helped with antibiotic medicine.  HOME CARE   If given, take antibiotics as told by your doctor. Finish them even if you start to feel better.  Drink enough fluids to keep your pee (urine) clear or pale yellow.  Avoid tea, drinks with caffeine, and bubbly (carbonated) drinks.  Pee often. Avoid holding your pee in for a long time.  Pee before and after having sex (intercourse).  Wipe from front to back after you poop (bowel movement)  if you are a woman. Use each tissue only once. GET HELP RIGHT AWAY IF:   You have back pain.  You have lower belly (abdominal) pain.  You have chills.  You feel sick to your stomach (nauseous).  You throw up  (vomit).  Your burning or discomfort with peeing does not go away.  You have a fever.  Your symptoms are not better in 3 days. MAKE SURE YOU:   Understand these instructions.  Will watch your condition.  Will get help right away if you are not doing well or get worse. Document Released: 09/14/2007 Document Revised: 12/21/2011 Document Reviewed: 10/27/2011 [facility] Patient Information 2014 [place], [place].

## 2013-06-06 LAB — HEPATIC FUNCTION PANEL
ALT: 15 U/L (ref 0–35)
AST: 17 U/L (ref 0–37)
Albumin: 4.8 g/dL (ref 3.5–5.2)
Alkaline Phosphatase: 41 U/L (ref 39–117)
Bilirubin, Direct: 0.1 mg/dL (ref 0.0–0.3)
Indirect Bilirubin: 0.5 mg/dL (ref 0.2–1.2)
TOTAL PROTEIN: 7.5 g/dL (ref 6.0–8.3)
Total Bilirubin: 0.6 mg/dL (ref 0.2–1.2)

## 2013-06-06 LAB — LIPID PANEL
CHOLESTEROL: 206 mg/dL — AB (ref 0–200)
HDL: 46 mg/dL (ref >39)
LDL CALC: 139 mg/dL — AB (ref 0–99)
Total CHOL/HDL Ratio: 4.5 Ratio
Triglycerides: 107 mg/dL (ref <150)
VLDL: 21 mg/dL (ref 0–40)

## 2013-06-06 LAB — BASIC METABOLIC PANEL WITH GFR
BUN: 11 mg/dL (ref 6–23)
CHLORIDE: 102 meq/L (ref 96–112)
CO2: 30 meq/L (ref 19–32)
Calcium: 10.3 mg/dL (ref 8.4–10.5)
Creat: 1.08 mg/dL (ref 0.50–1.10)
GFR, Est African American: 67 mL/min
GFR, Est Non African American: 58 mL/min — ABNORMAL LOW
Glucose, Bld: 77 mg/dL (ref 70–99)
Potassium: 3.9 mEq/L (ref 3.5–5.3)
Sodium: 139 mEq/L (ref 135–145)

## 2013-06-06 LAB — URINALYSIS, ROUTINE W REFLEX MICROSCOPIC
Bilirubin Urine: NEGATIVE
Glucose, UA: NEGATIVE mg/dL
Hgb urine dipstick: NEGATIVE
Ketones, ur: NEGATIVE mg/dL
Leukocytes, UA: NEGATIVE
Nitrite: NEGATIVE
Protein, ur: NEGATIVE mg/dL
SPECIFIC GRAVITY, URINE: 1.014 (ref 1.005–1.030)
UROBILINOGEN UA: 0.2 mg/dL (ref 0.0–1.0)
pH: 5.5 (ref 5.0–8.0)

## 2013-06-06 LAB — HEMOGLOBIN A1C
HEMOGLOBIN A1C: 6 % — AB (ref <5.7)
Mean Plasma Glucose: 126 mg/dL — ABNORMAL HIGH (ref <117)

## 2013-06-07 LAB — URINE CULTURE

## 2013-09-11 ENCOUNTER — Ambulatory Visit (INDEPENDENT_AMBULATORY_CARE_PROVIDER_SITE_OTHER): Payer: 59 | Admitting: Emergency Medicine

## 2013-09-11 ENCOUNTER — Encounter: Payer: Self-pay | Admitting: Emergency Medicine

## 2013-09-11 VITALS — BP 118/60 | HR 76 | Temp 98.4°F | Resp 18 | Ht 63.5 in | Wt 165.0 lb

## 2013-09-11 DIAGNOSIS — Z79899 Other long term (current) drug therapy: Secondary | ICD-10-CM

## 2013-09-11 DIAGNOSIS — B351 Tinea unguium: Secondary | ICD-10-CM

## 2013-09-11 DIAGNOSIS — E782 Mixed hyperlipidemia: Secondary | ICD-10-CM

## 2013-09-11 DIAGNOSIS — E039 Hypothyroidism, unspecified: Secondary | ICD-10-CM

## 2013-09-11 DIAGNOSIS — R7309 Other abnormal glucose: Secondary | ICD-10-CM

## 2013-09-11 LAB — CBC WITH DIFFERENTIAL/PLATELET
Basophils Absolute: 0 10*3/uL (ref 0.0–0.1)
Basophils Relative: 0 % (ref 0–1)
EOS PCT: 1 % (ref 0–5)
Eosinophils Absolute: 0.1 10*3/uL (ref 0.0–0.7)
HEMATOCRIT: 36.6 % (ref 36.0–46.0)
Hemoglobin: 12.6 g/dL (ref 12.0–15.0)
LYMPHS ABS: 4.5 10*3/uL — AB (ref 0.7–4.0)
LYMPHS PCT: 49 % — AB (ref 12–46)
MCH: 28.6 pg (ref 26.0–34.0)
MCHC: 34.4 g/dL (ref 30.0–36.0)
MCV: 83 fL (ref 78.0–100.0)
MONO ABS: 0.6 10*3/uL (ref 0.1–1.0)
Monocytes Relative: 6 % (ref 3–12)
NEUTROS ABS: 4 10*3/uL (ref 1.7–7.7)
Neutrophils Relative %: 44 % (ref 43–77)
Platelets: 297 10*3/uL (ref 150–400)
RBC: 4.41 MIL/uL (ref 3.87–5.11)
RDW: 14.4 % (ref 11.5–15.5)
WBC: 9.2 10*3/uL (ref 4.0–10.5)

## 2013-09-11 NOTE — Patient Instructions (Addendum)
Bad carbs also include fruit juice, alcohol, and sweet tea. These are empty calories that do not signal to your brain that you are full.   Please remember the good carbs are still carbs which convert into sugar. So please measure them out no more than 1/2-1 cup of rice, oatmeal, pasta, and beans.  Veggies are however free foods! Pile them on.   I like lean protein at every meal such as chicken, [place], pork chops, cottage cheese, etc. Just do not fry these meats and please center your meal around vegetable, the meats should be a side dish.   No all fruit is created equal. Please see the list below, the fruit at the bottom is higher in sugars than the fruit at the top   We want weight loss that will last so you should lose 1-2 pounds a week.  THAT IS IT! Please pick THREE things a month to change. Once it is a habit check off the item. Then pick another three items off the list to become habits.  If you are already doing a habit on the list GREAT!  Cross that item off! o Don't drink your calories. Ie, alcohol, soda, fruit juice, and sweet tea.  o Drink more water. Drink a glass when you feel hungry or before each meal.  o Eat breakfast - Complex carb and protein (likeDannon light and fit yogurt, oatmeal, fruit, eggs, [place] bacon). o Measure your cereal.  Eat no more than one cup a day. (ie [place]) o Eat an apple a day. o Add a vegetable a day. o Try a new vegetable a month. o Use Pam! Stop using oil or butter to cook. o Don't finish your plate or use smaller plates. o Share your dessert. o Eat sugar free Jello for dessert or frozen grapes. o Don't eat 2-3 hours before bed. o Switch to whole wheat bread, pasta, and brown rice. o Make healthier choices when you eat out. No fries! o Pick baked chicken, NOT fried. o Don't forget to SLOW DOWN when you eat. It is not going anywhere.  o Take the stairs. o Park far away in the parking lot o [employer] (or weights) for 10 minutes while  watching TV. o Walk at work for 10 minutes during break. o Walk outside 1 time a week with your friend, kids, dog, or significant other. o Start a walking group at church. o Walk the mall as much as you can tolerate.  o Keep a food diary. o Weigh yourself daily. o Walk for 15 minutes 3 days per week. o Cook at home more often and eat out less.  If life happens and you go back to old habits, it is okay.  Just start over. You can do it!   If you experience chest pain, get short of breath, or tired during the exercise, please stop immediately and inform your doctor.  Constipation, Adult Constipation is when a person: Poops (bowel movement) less than 3 times a week. Has a hard time pooping. Has poop that is dry, hard, or bigger than normal. HOME CARE  Eat more fiber, such as fruits, vegetables, whole grains like brown rice, and beans. Eat less fatty foods and sugar. This includes [place] fries, hamburgers, cookies, candy, and soda. If you are not getting enough fiber from food, take products with added fiber in them (supplements). Drink enough fluid to keep your pee (urine) clear or pale yellow. Go to the restroom when you feel  like you need to poop. Do not hold it. Only take medicine as told by your doctor. Do not take medicines that help you poop (laxatives) without talking to your doctor first. Exercise on a regular basis, or as told by your doctor. GET HELP RIGHT AWAY IF:  You have bright red blood in your poop (stool). Your constipation lasts more than 4 days or gets worse. You have belly (abdomen) or butt (rectal) pain. You have thin poop (as thin as a pencil). You lose weight, and it cannot be explained. MAKE SURE YOU:  Understand these instructions. Will watch your condition. Will get help right away if you are not doing well or get worse. Document Released: 09/14/2007 Document Revised: 06/20/2011 Document Reviewed: 01/07/2013 [facility] Patient Information 2014 [place],  [place].

## 2013-09-11 NOTE — Progress Notes (Signed)
Subjective:    Patient ID: Vicki Wright, female    DOB: [birth year], 53 y.o.   MRN: [id number]  HPI Comments: 53 yo AAF presents for 3 month F/U for Cholesterol, Pre-Dm, D. Deficient. She has been off rx x 2 days. She has been eating better. She is walking daily. She is concerned about weight is increasing.   She has noticed increased pain in low abdomen that radiates in to low back. She does have her ovaries. She notes + female cancers in aunts with either uterus or ovary. She denies bleeding. She notes pain is on/off. SHe notes constipation is +. She has not tried any OTC for constipation and notes she is up to date on female exam.   WBC             8.0   06/05/2013 HGB            12.2   06/05/2013 HCT            36.4   06/05/2013 PLT             266   06/05/2013 GLUCOSE          77   06/05/2013 CHOL            206   06/05/2013 TRIG            107   06/05/2013 HDL              46   06/05/2013 LDLCALC         139   06/05/2013 ALT              15   06/05/2013 AST              17   06/05/2013 NA              139   06/05/2013 K               3.9   06/05/2013 CL              102   06/05/2013 CREATININE     1.08   06/05/2013 BUN              11   06/05/2013 CO2              30   06/05/2013 TSH           0.497   02/11/2013 HGBA1C          6.0   06/05/2013 MICROALBUR     0.51   02/11/2013   Hyperlipidemia      Medication List       This list is accurate as of: 09/11/13  4:52 PM.  Always use your most recent med list.               CALCIUM 600 PO  Take by mouth daily.     HAIR/SKIN/NAILS PO  Take by mouth daily.     levothyroxine 150 MCG tablet  Commonly known as:  SYNTHROID, LEVOTHROID  Take 150 mcg by mouth daily. M,W,F,Sat,Sun     Magnesium 250 MG Tabs  Take by mouth daily.     simvastatin 40 MG tablet  Commonly known as:  ZOCOR  Take 1 tablet (40 mg total) by mouth daily.     Vitamin D3 5000 UNITS Tabs  Take 5,000 Units by mouth daily.     vitamin E 400 UNIT capsule   Generic  drug:  vitamin E  Take 400 Units by mouth daily.     Zinc 50 MG Tabs  Take by mouth daily.       No Known Allergies  Past Medical History  Diagnosis Date  . Hypothyroid   . Hypercholesteremia   . Hot flashes   . Osteoarthritis   . Colon polyp      Review of Systems  Gastrointestinal: Positive for constipation.  Musculoskeletal: Positive for back pain.  All other systems reviewed and are negative.  BP 118/60  Pulse 76  Temp(Src) 98.4 F (36.9 C) (Temporal)  Resp 18  Ht 5' 3.5" (1.613 m)  Wt 165 lb (74.844 kg)  BMI 28.77 kg/m2     Objective:   Physical Exam  Nursing note and vitals reviewed. Constitutional: She is oriented to person, place, and time. She appears well-developed and well-nourished. No distress.  HENT:  Head: Normocephalic and atraumatic.  Right Ear: External ear normal.  Left Ear: External ear normal.  Nose: Nose normal.  Mouth/Throat: Oropharynx is clear and moist.  Eyes: Conjunctivae and EOM are normal.  Neck: Normal range of motion. Neck supple. No JVD present. No thyromegaly present.  Cardiovascular: Normal rate, regular rhythm, normal heart sounds and intact distal pulses.   Pulmonary/Chest: Effort normal and breath sounds normal.  Abdominal: Soft. Bowel sounds are normal. She exhibits no distension and no mass. There is no tenderness. There is no rebound and no guarding.  Genitourinary:  Def gyn  Musculoskeletal: Normal range of motion. She exhibits no edema and no tenderness.  Lymphadenopathy:    She has no cervical adenopathy.  Neurological: She is alert and oriented to person, place, and time. No cranial nerve deficit.  Skin: Skin is warm and dry. No rash noted. No erythema. No pallor.  Right great toe nail thick/ ingrown  Psychiatric: She has a normal mood and affect. Her behavior is normal. Judgment and thought content normal.          Assessment & Plan:  1.  3 month F/U for  Cholesterol, Pre-Dm, D. Deficient. Needs  healthy diet, cardio QD and obtain healthy weight. Check Labs, Check BP if >130/80 call office   2. ? Constipation vs ovaries- Increase fiber/ water intake, decrease caffeine, increase activity level, can add Miralax or Metamucil QD until BM soft, F/u GYN if no change may need CT abd pelvis

## 2013-09-12 LAB — INSULIN, FASTING: Insulin fasting, serum: 67 u[IU]/mL — ABNORMAL HIGH (ref 3–28)

## 2013-09-12 LAB — TSH: TSH: 6.01 u[IU]/mL — ABNORMAL HIGH (ref 0.350–4.500)

## 2013-09-12 LAB — HEPATIC FUNCTION PANEL
ALBUMIN: 4.7 g/dL (ref 3.5–5.2)
ALT: 16 U/L (ref 0–35)
AST: 15 U/L (ref 0–37)
Alkaline Phosphatase: 41 U/L (ref 39–117)
BILIRUBIN DIRECT: 0.1 mg/dL (ref 0.0–0.3)
Indirect Bilirubin: 0.4 mg/dL (ref 0.2–1.2)
Total Bilirubin: 0.5 mg/dL (ref 0.2–1.2)
Total Protein: 7.5 g/dL (ref 6.0–8.3)

## 2013-09-12 LAB — BASIC METABOLIC PANEL WITH GFR
BUN: 12 mg/dL (ref 6–23)
CHLORIDE: 99 meq/L (ref 96–112)
CO2: 30 meq/L (ref 19–32)
Calcium: 10.3 mg/dL (ref 8.4–10.5)
Creat: 1.08 mg/dL (ref 0.50–1.10)
GFR, EST NON AFRICAN AMERICAN: 58 mL/min — AB
GFR, Est African American: 67 mL/min
GLUCOSE: 99 mg/dL (ref 70–99)
Potassium: 3.9 mEq/L (ref 3.5–5.3)
Sodium: 139 mEq/L (ref 135–145)

## 2013-09-12 LAB — LIPID PANEL
Cholesterol: 245 mg/dL — ABNORMAL HIGH (ref 0–200)
HDL: 48 mg/dL (ref >39)
LDL Cholesterol: 151 mg/dL — ABNORMAL HIGH (ref 0–99)
TRIGLYCERIDES: 228 mg/dL — AB (ref <150)
Total CHOL/HDL Ratio: 5.1 Ratio
VLDL: 46 mg/dL — AB (ref 0–40)

## 2013-09-12 LAB — HEMOGLOBIN A1C
Hgb A1c MFr Bld: 6.1 % — ABNORMAL HIGH (ref <5.7)
Mean Plasma Glucose: 128 mg/dL — ABNORMAL HIGH (ref <117)

## 2013-09-18 ENCOUNTER — Other Ambulatory Visit: Payer: Self-pay

## 2013-09-18 DIAGNOSIS — Z1231 Encounter for screening mammogram for malignant neoplasm of breast: Secondary | ICD-10-CM

## 2013-09-25 ENCOUNTER — Ambulatory Visit: Payer: 59 | Admitting: Podiatry

## 2013-10-02 ENCOUNTER — Encounter: Payer: Self-pay | Admitting: Internal Medicine

## 2013-10-03 ENCOUNTER — Ambulatory Visit (INDEPENDENT_AMBULATORY_CARE_PROVIDER_SITE_OTHER): Payer: 59 | Admitting: Podiatry

## 2013-10-03 ENCOUNTER — Encounter: Payer: Self-pay | Admitting: Podiatry

## 2013-10-03 DIAGNOSIS — L600 Ingrowing nail: Secondary | ICD-10-CM

## 2013-10-03 DIAGNOSIS — B351 Tinea unguium: Secondary | ICD-10-CM

## 2013-10-03 NOTE — Progress Notes (Signed)
   Subjective:    Patient ID: Vicki Wright, female    DOB: 11/04/1960, 53 y.o.   MRN: [id number]  HPI Comments: "I have an ingrown toenail"  Patient c/o tenderness 1st toe right, lateral border, for about 6 months. That corner of nail is thick and discolored. Tried trimming and OTC meds-no help.     Review of Systems  All other systems reviewed and are negative.      Objective:   Physical Exam        Assessment & Plan:

## 2013-10-03 NOTE — Progress Notes (Signed)
Subjective:     Patient ID: Vicki Wright, female   DOB: 05/04/1960, 53 y.o.   MRN: [id number]  Toe Pain    patient states I have an ingrown toenail on my right big toe and it's also thick on the corner and the second toe presses over it   Review of Systems  All other systems reviewed and are negative.      Objective:   Physical Exam  Constitutional: She is oriented to person, place, and time.  Cardiovascular: Intact distal pulses.   Musculoskeletal: Normal range of motion.  Neurological: She is oriented to person, place, and time.  Skin: Skin is warm.   neurovascular status is intact with muscle strength adequate and range of motion subtalar and midtarsal joint within normal limits. 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 Recommended removal of the corner and infiltrated 60 mg like Marcaine mixture remove the lateral corner exposing the root and applied chemical phenol 3 applications followed by alcohol lavaged and sterile dressing. Gave instructions on soaks and reappoint

## 2013-10-03 NOTE — Patient Instructions (Signed)

## 2013-10-21 ENCOUNTER — Other Ambulatory Visit: Payer: Self-pay | Admitting: Emergency Medicine

## 2013-10-21 DIAGNOSIS — E039 Hypothyroidism, unspecified: Secondary | ICD-10-CM

## 2013-10-22 ENCOUNTER — Other Ambulatory Visit: Payer: 59

## 2013-10-22 ENCOUNTER — Ambulatory Visit: Payer: Self-pay | Admitting: Emergency Medicine

## 2013-10-22 DIAGNOSIS — E039 Hypothyroidism, unspecified: Secondary | ICD-10-CM

## 2013-10-23 LAB — TSH: TSH: 0.218 u[IU]/mL — AB (ref 0.350–4.500)

## 2013-10-28 ENCOUNTER — Ambulatory Visit
Admission: RE | Admit: 2013-10-28 | Discharge: 2013-10-28 | Disposition: A | Discharge status: Home / Self Care | Payer: 59 | Source: Ambulatory Visit

## 2013-10-28 DIAGNOSIS — Z1231 Encounter for screening mammogram for malignant neoplasm of breast: Secondary | ICD-10-CM

## 2013-11-01 ENCOUNTER — Other Ambulatory Visit: Payer: Self-pay | Admitting: Emergency Medicine

## 2013-11-25 ENCOUNTER — Ambulatory Visit (INDEPENDENT_AMBULATORY_CARE_PROVIDER_SITE_OTHER): Payer: 59 | Admitting: *Deleted

## 2013-11-25 DIAGNOSIS — N390 Urinary tract infection, site not specified: Secondary | ICD-10-CM

## 2013-11-25 DIAGNOSIS — E039 Hypothyroidism, unspecified: Secondary | ICD-10-CM

## 2013-11-25 NOTE — Progress Notes (Signed)
Patient ID: Vicki MillardShirley H Wright, female   DOB: 08/19/1960, 53 y.o.   MRN: [id number] Patient presents to recheck TSH.  States she currently takes 150 mcg levothyroxine by mouth daily. M,W,F,Sat,Sun.  Also needs to recheck UA,C&S.  States Gyn checked urine  In June and was given Cipro rx for UTI.  Patient states she had allergic reaction to Cipro with throat swelling.  Patient was told to d/c abx but was not given a new abx to treat UTI.  I advised her we will recheck Urine today to determine sensitivity and proper abx to prescribe patient.

## 2013-11-26 LAB — URINALYSIS, ROUTINE W REFLEX MICROSCOPIC
BILIRUBIN URINE: NEGATIVE
GLUCOSE, UA: NEGATIVE mg/dL
Hgb urine dipstick: NEGATIVE
Ketones, ur: NEGATIVE mg/dL
Leukocytes, UA: NEGATIVE
Nitrite: NEGATIVE
PH: 6 (ref 5.0–8.0)
Protein, ur: NEGATIVE mg/dL
Specific Gravity, Urine: 1.022 (ref 1.005–1.030)
Urobilinogen, UA: 1 mg/dL (ref 0.0–1.0)

## 2013-11-26 LAB — URINE CULTURE: Colony Count: 40000

## 2013-11-26 LAB — TSH: TSH: 1.989 u[IU]/mL (ref 0.350–4.500)

## 2013-12-26 ENCOUNTER — Other Ambulatory Visit: Payer: Self-pay | Admitting: Physician Assistant

## 2014-01-24 ENCOUNTER — Other Ambulatory Visit: Payer: Self-pay

## 2014-02-10 ENCOUNTER — Other Ambulatory Visit: Payer: Self-pay | Admitting: Internal Medicine

## 2014-02-10 ENCOUNTER — Other Ambulatory Visit: Payer: Self-pay | Admitting: Emergency Medicine

## 2014-02-13 ENCOUNTER — Encounter: Payer: Self-pay | Admitting: Internal Medicine

## 2014-02-19 ENCOUNTER — Encounter: Payer: Self-pay | Admitting: Emergency Medicine

## 2014-03-13 ENCOUNTER — Ambulatory Visit (INDEPENDENT_AMBULATORY_CARE_PROVIDER_SITE_OTHER): Payer: 59 | Admitting: Emergency Medicine

## 2014-03-13 ENCOUNTER — Encounter: Payer: Self-pay | Admitting: Emergency Medicine

## 2014-03-13 VITALS — BP 122/64 | HR 74 | Temp 98.6°F | Resp 18 | Ht 62.0 in | Wt 174.0 lb

## 2014-03-13 DIAGNOSIS — Z0001 Encounter for general adult medical examination with abnormal findings: Secondary | ICD-10-CM

## 2014-03-13 DIAGNOSIS — Z111 Encounter for screening for respiratory tuberculosis: Secondary | ICD-10-CM

## 2014-03-13 DIAGNOSIS — Z0000 Encounter for general adult medical examination without abnormal findings: Secondary | ICD-10-CM

## 2014-03-13 DIAGNOSIS — I10 Essential (primary) hypertension: Secondary | ICD-10-CM

## 2014-03-13 DIAGNOSIS — E782 Mixed hyperlipidemia: Secondary | ICD-10-CM

## 2014-03-13 DIAGNOSIS — R6889 Other general symptoms and signs: Secondary | ICD-10-CM

## 2014-03-13 LAB — CBC WITH DIFFERENTIAL/PLATELET
Basophils Absolute: 0 10*3/uL (ref 0.0–0.1)
Basophils Relative: 0 % (ref 0–1)
EOS PCT: 1 % (ref 0–5)
Eosinophils Absolute: 0.1 10*3/uL (ref 0.0–0.7)
HEMATOCRIT: 37.7 % (ref 36.0–46.0)
HEMOGLOBIN: 12.8 g/dL (ref 12.0–15.0)
LYMPHS ABS: 3.9 10*3/uL (ref 0.7–4.0)
LYMPHS PCT: 44 % (ref 12–46)
MCH: 28.7 pg (ref 26.0–34.0)
MCHC: 34 g/dL (ref 30.0–36.0)
MCV: 84.5 fL (ref 78.0–100.0)
MONO ABS: 0.7 10*3/uL (ref 0.1–1.0)
MONOS PCT: 8 % (ref 3–12)
MPV: 10.2 fL (ref 9.4–12.4)
Neutro Abs: 4.1 10*3/uL (ref 1.7–7.7)
Neutrophils Relative %: 47 % (ref 43–77)
Platelets: 293 10*3/uL (ref 150–400)
RBC: 4.46 MIL/uL (ref 3.87–5.11)
RDW: 14.6 % (ref 11.5–15.5)
WBC: 8.8 10*3/uL (ref 4.0–10.5)

## 2014-03-13 NOTE — Patient Instructions (Signed)

## 2014-03-13 NOTE — Progress Notes (Signed)
Subjective:    Patient ID: Vicki Wright, female    DOB: 08/22/1960, 53 y.o.   MRN: [id number]  HPI Comments: 53 yo AAF CPE and cholesterol recheck. She is exercising routinely with work/ walking and eating healthy.  She is doing well overall. She still has hot flashes but has strong FHX breast cancer so defers any HRT. She has tried may herbals without relief but notes she has been able to manage them more lately and declines further workup.   She notes she is up to date on everything except dentist. She notes she has claustrophobia "like" episodes at the dentist.   Lab Results  Component Value Date   WBC 8.8 03/13/2014   HGB 12.8 03/13/2014   HCT 37.7 03/13/2014   PLT 293 03/13/2014   GLUCOSE 88 03/13/2014   CHOL 173 03/13/2014   TRIG 110 03/13/2014   HDL 51 03/13/2014   LDLCALC 100* 03/13/2014   ALT 26 03/13/2014   AST 22 03/13/2014   NA 140 03/13/2014   K 4.1 03/13/2014   CL 101 03/13/2014   CREATININE 1.03 03/13/2014   BUN 12 03/13/2014   CO2 30 03/13/2014   TSH 1.587 03/13/2014   HGBA1C 6.4* 03/13/2014   MICROALBUR 1.0 03/13/2014      Medication List       This list is accurate as of: 03/13/14 11:59 PM.  Always use your most recent med list.               BIOTIN PO  Take by mouth.     CALCIUM 600 PO  Take by mouth daily.     HAIR/SKIN/NAILS PO  Take by mouth daily.     levothyroxine 150 MCG tablet  Commonly known as:  SYNTHROID, LEVOTHROID  TAKE 1 TABLET (150 MCG TOTAL) BY MOUTH DAILY.     Magnesium 250 MG Tabs  Take by mouth daily.     simvastatin 40 MG tablet  Commonly known as:  ZOCOR  TAKE 1 TABLET (40 MG TOTAL) BY MOUTH DAILY.     Vitamin D3 5000 UNITS Tabs  Take 5,000 Units by mouth daily.     vitamin E 400 UNIT capsule  Generic drug:  vitamin E  Take 400 Units by mouth daily.     Zinc 50 MG Tabs  Take by mouth daily.       Allergies  Allergen Reactions  . Cephalexin     Throat swelling    Past Medical History  Diagnosis  Date  . Hypothyroid   . Hypercholesteremia   . Hot flashes   . Osteoarthritis   . Colon polyp    Past Surgical History  Procedure Laterality Date  . Cesarean section  80 & 82  . Vaginal hysterectomy  2002   History  Substance Use Topics  . Smoking status: Never Smoker   . Smokeless tobacco: Never Used  . Alcohol Use: No   Family History  Problem Relation Age of Onset  . Cancer Mother   . Hypertension Mother   . Heart disease Mother   . Diabetes Mother   . Hypertension Father   . Heart disease Father   . Diabetes Father   . Cancer Maternal Aunt   . Cancer Paternal Aunt   . Cancer Maternal Aunt   . Cancer Paternal Aunt   . Cancer Paternal Aunt   . Cancer Paternal Aunt    MAINTENANCE: Colonoscopy:11/2013 WNL but with polyp HX repeat 2020 Mammo:10/28/13 [id number]:[id number]:2012 WNL Pap/ Pelvic:03/10/2013  WNL due 2016 EYE:2015 WNL, bifocals Dentist:OVERDUE because claustrophobic  IMMUNIZATIONS: Tdap:2008 Pneumovax:n/a Zostavax:n/a Influenza:declines  Patient Care Team: Lucky CowboyWilliam McKeown, MD as PCP - General (Internal Medicine) Francene BoyersSteven W Bernstorf, MD as Referring Physician (Optometry) Rollene RotundaJames Hochrein, MD as Consulting Physician (Cardiology) Charna ElizabethJyothi Mann, MD as Consulting Physician (Gastroenterology) Velna OchsPeter G Dalldorf, MD as Consulting Physician (Orthopedic Surgery)   Review of Systems  All other systems reviewed and are negative.   BP 122/64 mmHg  Pulse 74  Temp(Src) 98.6 F (37 C) (Temporal)  Resp 18  Ht 5\' 2"  (1.575 m)  Wt 174 lb (78.926 kg)  BMI 31.82 kg/m2     Objective:   Physical Exam  Constitutional: She is oriented to person, place, and time. She appears well-developed and well-nourished. No distress.  HENT:  Head: Normocephalic and atraumatic.  Right Ear: External ear normal.  Left Ear: External ear normal.  Nose: Nose normal.  Mouth/Throat: Oropharynx is clear and moist.  Eyes: Conjunctivae and EOM are normal. Pupils are equal, round, and reactive to light.  Right eye exhibits no discharge. Left eye exhibits no discharge. No scleral icterus.  Neck: Normal range of motion. Neck supple. No JVD present. No tracheal deviation present. No thyromegaly present.  Cardiovascular: Normal rate, regular rhythm, normal heart sounds and intact distal pulses.   Pulmonary/Chest: Effort normal and breath sounds normal.  Abdominal: Soft. Bowel sounds are normal. She exhibits no distension and no mass. There is no tenderness. There is no rebound and no guarding.  Genitourinary:  Breasts: WNL GYN def 2016  Musculoskeletal: Normal range of motion. She exhibits no edema or tenderness.  Lymphadenopathy:    She has no cervical adenopathy.  Neurological: She is alert and oriented to person, place, and time. She has normal reflexes. No cranial nerve deficit. She exhibits normal muscle tone. Coordination normal.  Skin: Skin is warm and dry. No rash noted. No erythema. No pallor.  Psychiatric: She has a normal mood and affect. Her behavior is normal. Judgment and thought content normal.  Nursing note and vitals reviewed.    AORTA SCAN WNL EKG NSCSPT     Assessment & Plan:  1. CPE- Update screening labs/ History/ Immunizations/ Testing as needed. Advised healthy diet, QD exercise, increase H20 and continue RX/ Vitamins AD. Advise since overdue for dental cleaning with fears could try Valium RX if she has a driver. She w/c if desires RX  2. Cholesterol- recheck labs, Need to eat healthier and exercise AD.  3. Hot Flashes- Patient aware we can get further workup, she w/c if desires

## 2014-03-14 LAB — HEPATIC FUNCTION PANEL
ALT: 26 U/L (ref 0–35)
AST: 22 U/L (ref 0–37)
Albumin: 4.7 g/dL (ref 3.5–5.2)
Alkaline Phosphatase: 45 U/L (ref 39–117)
Bilirubin, Direct: 0.1 mg/dL (ref 0.0–0.3)
Indirect Bilirubin: 0.6 mg/dL (ref 0.2–1.2)
TOTAL PROTEIN: 7.8 g/dL (ref 6.0–8.3)
Total Bilirubin: 0.7 mg/dL (ref 0.2–1.2)

## 2014-03-14 LAB — HEMOGLOBIN A1C
Hgb A1c MFr Bld: 6.4 % — ABNORMAL HIGH (ref <5.7)
Mean Plasma Glucose: 137 mg/dL — ABNORMAL HIGH (ref <117)

## 2014-03-14 LAB — URINALYSIS, MICROSCOPIC ONLY
Bacteria, UA: NONE SEEN
CRYSTALS: NONE SEEN
Casts: NONE SEEN

## 2014-03-14 LAB — URINALYSIS, ROUTINE W REFLEX MICROSCOPIC
BILIRUBIN URINE: NEGATIVE
GLUCOSE, UA: NEGATIVE mg/dL
HGB URINE DIPSTICK: NEGATIVE
Ketones, ur: NEGATIVE mg/dL
Nitrite: NEGATIVE
PROTEIN: NEGATIVE mg/dL
Specific Gravity, Urine: 1.016 (ref 1.005–1.030)
Urobilinogen, UA: 0.2 mg/dL (ref 0.0–1.0)
pH: 6 (ref 5.0–8.0)

## 2014-03-14 LAB — BASIC METABOLIC PANEL WITH GFR
BUN: 12 mg/dL (ref 6–23)
CALCIUM: 10.1 mg/dL (ref 8.4–10.5)
CHLORIDE: 101 meq/L (ref 96–112)
CO2: 30 meq/L (ref 19–32)
CREATININE: 1.03 mg/dL (ref 0.50–1.10)
GFR, Est African American: 71 mL/min
GFR, Est Non African American: 61 mL/min
GLUCOSE: 88 mg/dL (ref 70–99)
Potassium: 4.1 mEq/L (ref 3.5–5.3)
Sodium: 140 mEq/L (ref 135–145)

## 2014-03-14 LAB — LIPID PANEL
CHOLESTEROL: 173 mg/dL (ref 0–200)
HDL: 51 mg/dL (ref >39)
LDL Cholesterol: 100 mg/dL — ABNORMAL HIGH (ref 0–99)
Total CHOL/HDL Ratio: 3.4 Ratio
Triglycerides: 110 mg/dL (ref <150)
VLDL: 22 mg/dL (ref 0–40)

## 2014-03-14 LAB — INSULIN, FASTING: Insulin fasting, serum: 7.6 u[IU]/mL (ref 2.0–19.6)

## 2014-03-14 LAB — MAGNESIUM: MAGNESIUM: 2.1 mg/dL (ref 1.5–2.5)

## 2014-03-14 LAB — MICROALBUMIN / CREATININE URINE RATIO
Creatinine, Urine: 146.1 mg/dL
Microalb Creat Ratio: 6.8 mg/g (ref 0.0–30.0)
Microalb, Ur: 1 mg/dL (ref <2.0)

## 2014-03-14 LAB — TSH: TSH: 1.587 u[IU]/mL (ref 0.350–4.500)

## 2014-03-14 LAB — VITAMIN D 25 HYDROXY (VIT D DEFICIENCY, FRACTURES): Vit D, 25-Hydroxy: 30 ng/mL (ref 30–100)

## 2014-03-17 ENCOUNTER — Encounter: Payer: Self-pay | Admitting: Emergency Medicine

## 2014-03-17 LAB — TB SKIN TEST
Induration: 0 mm
TB SKIN TEST: NEGATIVE

## 2014-03-18 ENCOUNTER — Other Ambulatory Visit: Payer: 59

## 2014-03-18 DIAGNOSIS — N390 Urinary tract infection, site not specified: Secondary | ICD-10-CM

## 2014-03-19 LAB — URINE CULTURE: Colony Count: >=100000

## 2014-03-19 LAB — URINALYSIS, ROUTINE W REFLEX MICROSCOPIC
BILIRUBIN URINE: NEGATIVE
GLUCOSE, UA: NEGATIVE mg/dL
Hgb urine dipstick: NEGATIVE
Ketones, ur: NEGATIVE mg/dL
Nitrite: NEGATIVE
Protein, ur: NEGATIVE mg/dL
Specific Gravity, Urine: 1.026 (ref 1.005–1.030)
Urobilinogen, UA: 0.2 mg/dL (ref 0.0–1.0)
pH: 5 (ref 5.0–8.0)

## 2014-03-19 LAB — URINALYSIS, MICROSCOPIC ONLY: CASTS: NONE SEEN

## 2014-04-20 ENCOUNTER — Encounter: Payer: Self-pay | Admitting: *Deleted

## 2014-05-01 ENCOUNTER — Encounter: Payer: Self-pay | Admitting: Physician Assistant

## 2014-05-01 ENCOUNTER — Encounter: Payer: 59 | Admitting: Internal Medicine

## 2014-05-01 ENCOUNTER — Ambulatory Visit (INDEPENDENT_AMBULATORY_CARE_PROVIDER_SITE_OTHER): Payer: 59 | Admitting: Physician Assistant

## 2014-05-01 VITALS — BP 106/60 | HR 90 | Temp 98.2°F | Resp 18 | Ht 62.0 in | Wt 175.0 lb

## 2014-05-01 DIAGNOSIS — J0100 Acute maxillary sinusitis, unspecified: Secondary | ICD-10-CM

## 2014-05-01 MED ORDER — BENZONATATE 100 MG PO CAPS: 200.0000 mg | ORAL_CAPSULE | Freq: Three times a day (TID) | ORAL | Status: DC | PRN

## 2014-05-01 MED ORDER — PREDNISONE 20 MG PO TABS: ORAL_TABLET | ORAL | Status: DC

## 2014-05-01 MED ORDER — AZITHROMYCIN 250 MG PO TABS: ORAL_TABLET | ORAL | Status: DC

## 2014-05-01 NOTE — Patient Instructions (Addendum)
-Take Z-Pak as prescribed -Take Prednisone as prescribed for inflammation.  Please watch what you eat and it can make your hungry. -Take Tessalon Perles as prescribed for cough.   Please drink water to stay hydrated.   If you are not feeling better in 10-14 days, then please call the office.  Sinusitis Sinusitis is redness, soreness, and inflammation of the paranasal sinuses. Paranasal sinuses are air pockets within the bones of your face (beneath the eyes, the middle of the forehead, or above the eyes). In healthy paranasal sinuses, mucus is able to drain out, and air is able to circulate through them by way of your nose. However, when your paranasal sinuses are inflamed, mucus and air can become trapped. This can allow bacteria and other germs to grow and cause infection. Sinusitis can develop quickly and last only a short time (acute) or continue over a long period (chronic). Sinusitis that lasts for more than 12 weeks is considered chronic.  CAUSES  Causes of sinusitis include:  Allergies.  Structural abnormalities, such as displacement of the cartilage that separates your nostrils (deviated septum), which can decrease the air flow through your nose and sinuses and affect sinus drainage.  Functional abnormalities, such as when the small hairs (cilia) that line your sinuses and help remove mucus do not work properly or are not present. SIGNS AND SYMPTOMS  Symptoms of acute and chronic sinusitis are the same. The primary symptoms are pain and pressure around the affected sinuses. Other symptoms include:  Upper toothache.  Earache.  Headache.  Bad breath.  Decreased sense of smell and taste.  A cough, which worsens when you are lying flat.  Fatigue.  Fever.  Thick drainage from your nose, which often is green and may contain pus (purulent).  Swelling and warmth over the affected sinuses. DIAGNOSIS  Your health care provider will perform a physical exam. During the exam,  your health care provider may:  Look in your nose for signs of abnormal growths in your nostrils (nasal polyps).  Tap over the affected sinus to check for signs of infection.  View the inside of your sinuses (endoscopy) using an imaging device that has a light attached (endoscope). If your health care provider suspects that you have chronic sinusitis, one or more of the following tests may be recommended:  Allergy tests.  Nasal culture. A sample of mucus is taken from your nose, sent to a lab, and screened for bacteria.  Nasal cytology. A sample of mucus is taken from your nose and examined by your health care provider to determine if your sinusitis is related to an allergy. TREATMENT  Most cases of acute sinusitis are related to a viral infection and will resolve on their own within 10 days. Sometimes medicines are prescribed to help relieve symptoms (pain medicine, decongestants, nasal steroid sprays, or saline sprays).  However, for sinusitis related to a bacterial infection, your health care provider will prescribe antibiotic medicines. These are medicines that will help kill the bacteria causing the infection.  Rarely, sinusitis is caused by a fungal infection. In theses cases, your health care provider will prescribe antifungal medicine. For some cases of chronic sinusitis, surgery is needed. Generally, these are cases in which sinusitis recurs more than 3 times per year, despite other treatments. HOME CARE INSTRUCTIONS   Drink plenty of water. Water helps thin the mucus so your sinuses can drain more easily.  Use a humidifier.  Inhale steam 3 to 4 times a day (for example, sit  in the bathroom with the shower running).  Apply a warm, moist washcloth to your face 3 to 4 times a day, or as directed by your health care provider.  Use saline nasal sprays to help moisten and clean your sinuses.  Take medicines only as directed by your health care provider.  If you were prescribed  either an antibiotic or antifungal medicine, finish it all even if you start to feel better. SEEK IMMEDIATE MEDICAL CARE IF:  You have increasing pain or severe headaches.  You have nausea, vomiting, or drowsiness.  You have swelling around your face.  You have vision problems.  You have a stiff neck.  You have difficulty breathing. MAKE SURE YOU:   Understand these instructions.  Will watch your condition.  Will get help right away if you are not doing well or get worse. Document Released: 03/28/2005 Document Revised: 08/12/2013 Document Reviewed: 04/12/2011 [facility] Patient Information 2015 [place], [place]. This information is not intended to replace advice given to you by your health care provider. Make sure you discuss any questions you have with your health care provider.

## 2014-05-01 NOTE — Telephone Encounter (Signed)
EVISIT  Patient messaged the office complaining of symptoms of a URI. Symptoms include congestion, low grade fever, nasal congestion, purulent nasal discharge, sneezing, sore throat and denies chills, shortness of breath, chest pain. . Onset of symptoms was 4 days ago, and has been unchanged since that time. Treatment to date: medi-phenyl pills from her work. Marland Kitchen.  PLAN: URI- Discussed diagnosis and treatment of URI. Discussed the importance of avoiding unnecessary antibiotic therapy. Suggested symptomatic OTC remedies. Nasal saline spray for congestion. Zithromax per orders. Follow up as needed. Follow up in 7 days or as needed.

## 2014-05-01 NOTE — Progress Notes (Signed)
HPI  An African American 54 y.o.female presents to the office today due to sinus infection, sore throat, ear pain and cough that started 3 days ago.  She states her sxs are getting worse.  She has tried OTC sinus medication with no relief.  Never smoked.  She reports congestion, sinus pressure, rhinorrhea, sore throat, ear pain, cough with green mucus, voice change and headache.  She denies fever, chills, sweats, fatigue, post-nasal drip, trouble swallowing, SOB, CP, abdominal pain, nausea, vomiting, diarrhea, constipation, dizziness and lightheadedness.  Review of Systems  All other systems reviewed and are negative.  Past Medical History-  Past Medical History  Diagnosis Date  . Hypothyroid   . Hypercholesteremia   . Hot flashes   . Osteoarthritis   . Colon polyp    Medications-  Current Outpatient Prescriptions on File Prior to Visit  Medication Sig Dispense Refill  . BIOTIN PO Take by mouth.    . Calcium Carbonate (CALCIUM 600 PO) Take by mouth daily.    . Cholecalciferol (VITAMIN D3) 5000 UNITS TABS Take 5,000 Units by mouth daily.    Marland Kitchen levothyroxine (SYNTHROID, LEVOTHROID) 150 MCG tablet TAKE 1 TABLET (150 MCG TOTAL) BY MOUTH DAILY. 90 tablet 1  . Magnesium 250 MG TABS Take by mouth daily.    . Multiple Vitamins-Minerals (HAIR/SKIN/NAILS PO) Take by mouth daily.    . simvastatin (ZOCOR) 40 MG tablet TAKE 1 TABLET (40 MG TOTAL) BY MOUTH DAILY. 90 tablet 1  . vitamin E (VITAMIN E) 400 UNIT capsule Take 400 Units by mouth daily.    . Zinc 50 MG TABS Take by mouth daily.     No current facility-administered medications on file prior to visit.   Allergies-  Allergies  Allergen Reactions  . Cephalexin     Throat swelling   Physical Exam BP 106/60 mmHg  Pulse 90  Temp(Src) 98.2 F (36.8 C) (Temporal)  Resp 18  Ht  (1.575 m)  Wt 175 lb (79.379 kg)  BMI 32.00 kg/m2  SpO2 98% Wt Readings from Last 3 Encounters:  05/01/14 175 lb (79.379 kg)  03/13/14 174 lb (78.926  kg)  09/11/13 165 lb (74.844 kg)  Vitals Reviewed. General Appearance: Well nourished, in no apparent distress and had pleasant demeanor. Obese. Eyes:  PERRLA. EOMI. Conjunctiva is pink without edema, erythema or yellowing.  No scleral icterus. Sinuses: No Frontal/maxillary tenderness Ears: No erythema, edema or tenderness on both external ear cartilages and ear canals.  TMs are intact bilaterally with normal light reflexes and without erythema, edema or bulging. Nose: Nose is symmetrical and turbinates are erythematous and edematous bilaterally.  No polyps or paleness.  Rhinorrhea present. Throat: Oral pharynx is pink and moist. Erythematous and non-edematous posterior pharynx.  Mucosa is intact and without lesions. Tonsils are at +2 station bilaterally and do not have exudate.    Uvula is midline and not swollen. Neck: Supple,  LAD, trachea is midline. Full range of motion in neck intact Respiratory: CTAB,  r/r/w or stridor. No increased effort of breathing. Cardio: RRR.   m/r/g.  S1S2nl.  Pulses B/L +2  Abdomen: Symmetrical, soft, nontender, and rounded.  +BS nl x4.   Skin: Warm, dry, intact without rashes, lesions, ecchymosis, yellowing, cyanosis.   Neuro: Alert and oriented X3, cooperative.  Mood and affect appropriate to situation.  CN II-XII grossly intact.  Psych: Insight and Judgment appropriate.   Assessment and Plan 1. Acute maxillary sinusitis, recurrence not specified Take Z-Pak and Prednisone as prescribed that  Quentin MullingAmanda Collier, PA-C sent in today. - benzonatate (TESSALON PERLES) 100 MG capsule; Take 2 capsules (200 mg total) by mouth 3 (three) times daily as needed for cough (Max: 600mg  per day (6 capsules per day)).  Dispense: 120 capsule; Refill: 0  Discussed medication effects and SE's.  Pt agreed to treatment plan. If you are not feeling better in 10-14 days, then please call the office. Please keep your follow up appt on 06/25/14.  Mona Ayars, Lise AuerJennifer L, PA-C 2:06  PM [facility]

## 2014-06-25 ENCOUNTER — Ambulatory Visit (INDEPENDENT_AMBULATORY_CARE_PROVIDER_SITE_OTHER): Payer: 59 | Admitting: Physician Assistant

## 2014-06-25 ENCOUNTER — Encounter: Payer: Self-pay | Admitting: Physician Assistant

## 2014-06-25 VITALS — BP 110/78 | HR 76 | Temp 97.7°F | Resp 16 | Ht 62.0 in | Wt 177.0 lb

## 2014-06-25 DIAGNOSIS — E785 Hyperlipidemia, unspecified: Secondary | ICD-10-CM

## 2014-06-25 DIAGNOSIS — M1990 Unspecified osteoarthritis, unspecified site: Secondary | ICD-10-CM

## 2014-06-25 DIAGNOSIS — R7309 Other abnormal glucose: Secondary | ICD-10-CM | POA: Insufficient documentation

## 2014-06-25 DIAGNOSIS — E039 Hypothyroidism, unspecified: Secondary | ICD-10-CM | POA: Insufficient documentation

## 2014-06-25 DIAGNOSIS — E559 Vitamin D deficiency, unspecified: Secondary | ICD-10-CM

## 2014-06-25 DIAGNOSIS — E669 Obesity, unspecified: Secondary | ICD-10-CM | POA: Insufficient documentation

## 2014-06-25 DIAGNOSIS — R7303 Prediabetes: Secondary | ICD-10-CM

## 2014-06-25 DIAGNOSIS — M8580 Other specified disorders of bone density and structure, unspecified site: Secondary | ICD-10-CM | POA: Insufficient documentation

## 2014-06-25 DIAGNOSIS — Z79899 Other long term (current) drug therapy: Secondary | ICD-10-CM

## 2014-06-25 LAB — CBC WITH DIFFERENTIAL/PLATELET
Basophils Absolute: 0 10*3/uL (ref 0.0–0.1)
Basophils Relative: 0 % (ref 0–1)
EOS PCT: 1 % (ref 0–5)
Eosinophils Absolute: 0.1 10*3/uL (ref 0.0–0.7)
HCT: 36.4 % (ref 36.0–46.0)
HEMOGLOBIN: 12.2 g/dL (ref 12.0–15.0)
LYMPHS ABS: 3.6 10*3/uL (ref 0.7–4.0)
LYMPHS PCT: 48 % — AB (ref 12–46)
MCH: 28.6 pg (ref 26.0–34.0)
MCHC: 33.5 g/dL (ref 30.0–36.0)
MCV: 85.2 fL (ref 78.0–100.0)
MPV: 10.4 fL (ref 8.6–12.4)
Monocytes Absolute: 0.6 10*3/uL (ref 0.1–1.0)
Monocytes Relative: 8 % (ref 3–12)
Neutro Abs: 3.3 10*3/uL (ref 1.7–7.7)
Neutrophils Relative %: 43 % (ref 43–77)
Platelets: 259 10*3/uL (ref 150–400)
RBC: 4.27 MIL/uL (ref 3.87–5.11)
RDW: 14.5 % (ref 11.5–15.5)
WBC: 7.6 10*3/uL (ref 4.0–10.5)

## 2014-06-25 MED ORDER — MELOXICAM 15 MG PO TABS: ORAL_TABLET | ORAL | Status: DC

## 2014-06-25 NOTE — Patient Instructions (Signed)
Diabetes is a very complicated disease...lets simplify it.  An easy way to look at it to understand the complications is if you think of the extra sugar floating in your blood stream as glass shards floating through your blood stream.    Diabetes affects your small vessels first: 1) The glass shards (sugar) scraps down the tiny blood vessels in your eyes and lead to diabetic retinopathy, the leading cause of blindness in the [place]. Diabetes is the leading cause of newly diagnosed adult (20 to 56 years of age) blindness in the [place].  2) The glass shards scratches down the tiny vessels of your legs leading to nerve damage called neuropathy and can lead to amputations of your feet. More than 60% of all non-traumatic amputations of lower limbs occur in people with diabetes.  3) Over time the small vessels in your brain are shredded and closed off, individually this does not cause any problems but over a long period of time many of the small vessels being blocked can lead to Vascular Dementia.   4) Your kidney's are a filter system and have a "net" that keeps certain things in the body and lets bad things out. Sugar shreds this net and leads to kidney damage and eventually failure. Decreasing the sugar that is destroying the net and certain blood pressure medications can help stop or decrease progression of kidney disease. Diabetes was the primary cause of kidney failure in 44 percent of all new cases in 2011.  5) Diabetes also destroys the small vessels in your penis that lead to erectile dysfunction. Eventually the vessels are so damaged that you may not be responsive to cialis or viagra.   Diabetes and your large vessels: Your larger vessels consist of your coronary arteries in your heart and the carotid vessels to your brain. Diabetes or even increased sugars put you at 300% increased risk of heart attack and stroke and this is why.. The sugar scrapes down your large blood vessels and your body  sees this as an internal injury and tries to repair itself. Just like you get a scab on your skin, your platelets will stick to the blood vessel wall trying to heal it. This is why we have diabetics on low dose aspirin daily, this prevents the platelets from sticking and can prevent plaque formation. In addition, your body takes cholesterol and tries to shove it into the open wound. This is why we want your LDL, or bad cholesterol, below 70.   The combination of platelets and cholesterol over 5-10 years forms plaque that can break off and cause a heart attack or stroke.   PLEASE REMEMBER:  Diabetes is preventable! Up to 85 percent of complications and morbidities among individuals with type 2 diabetes can be prevented, delayed, or effectively treated and minimized with regular visits to a health professional, appropriate monitoring and medication, and a healthy diet and lifestyle.     Bad carbs also include fruit juice, alcohol, and sweet tea. These are empty calories that do not signal to your brain that you are full.   Please remember the good carbs are still carbs which convert into sugar. So please measure them out no more than 1/2-1 cup of rice, oatmeal, pasta, and beans  Veggies are however free foods! Pile them on.   Not all fruit is created equal. Please see the list below, the fruit at the bottom is higher in sugars than the fruit at the top. Please avoid all dried fruits.       We want weight loss that will last so you should lose 1-2 pounds a week.  THAT IS IT! Please pick THREE things a month to change. Once it is a habit check off the item. Then pick another three items off the list to become habits.  If you are already doing a habit on the list GREAT!  Cross that item off! o Don't drink your calories. Ie, alcohol, soda, fruit juice, and sweet tea.  o Drink more water. Drink a glass when you feel hungry or before each meal.  o Eat breakfast - Complex carb and protein (likeDannon  light and fit yogurt, oatmeal, fruit, eggs, [place] bacon). o Measure your cereal.  Eat no more than one cup a day. (ie [place]) o Eat an apple a day. o Add a vegetable a day. o Try a new vegetable a month. o Use Pam! Stop using oil or butter to cook. o Don't finish your plate or use smaller plates. o Share your dessert. o Eat sugar free Jello for dessert or frozen grapes. o Don't eat 2-3 hours before bed. o Switch to whole wheat bread, pasta, and brown rice. o Make healthier choices when you eat out. No fries! o Pick baked chicken, NOT fried. o Don't forget to SLOW DOWN when you eat. It is not going anywhere.  o Take the stairs. o Park far away in the parking lot o [employer] (or weights) for 10 minutes while watching TV. o Walk at work for 10 minutes during break. o Walk outside 1 time a week with your friend, kids, dog, or significant other. o Start a walking group at church. o Walk the mall as much as you can tolerate.  o Keep a food diary. o Weigh yourself daily. o Walk for 15 minutes 3 days per week. o Cook at home more often and eat out less.  If life happens and you go back to old habits, it is okay.  Just start over. You can do it!   If you experience chest pain, get short of breath, or tired during the exercise, please stop immediately and inform your doctor.    Iliotibial Band Syndrome with Rehab The iliotibial (IT) band is a tendon that connects the hip muscles to the shinbone (tibia) and to one of the bones of the pelvis (ileum). The IT band passes by the knee and is often irritated by the outer portion of the knee (lateral femoral condyle). A fluid filled sac (bursa) exists between the tendon and the bone, to cushion and reduce friction. Overuse of the tendon may cause excessive friction, which results in IT band syndrome. This condition involves inflammation of the bursa (bursitis) and/or inflammation of the IT band (tendinitis). SYMPTOMS  5. Pain, tenderness,  swelling, warmth, or redness over the IT band, at the outer knee (above the joint). 6. Pain that travels up or down the thigh or leg. 7. Initially, pain at the beginning of an exercise, that decreases once warmed up. Eventually, pain throughout the activity, getting worse as the activity continues. May cause the athlete to stop in the middle of training or competing. 8. Pain that gets worse when running down hills or stairs, on banked tracks, or next to the curb on the street. 9. Pain that increases when the foot of the affected leg hits the ground. 10. Possibly, a crackling sound (crepitation) when the tendon or bursa is moved or touched. CAUSES  IT band syndrome is caused by irritation of the  IT band and the underlying bursa. This eventually results in inflammation and pain. IT band syndrome is an overuse injury.  RISK INCREASES WITH:  Sports with repetitive knee-bending activities (distance running, cycling).  Incorrect training techniques, including sudden changes in the intensity, frequency, or duration of training.  Not enough rest between workouts.  Poor strength and flexibility, especially a tight IT band.  Failure to warm up properly before activity.  Bow legs.  Arthritis of the knee. PREVENTION   Warm up and stretch properly before activity.  Allow for adequate recovery between workouts.  Maintain physical fitness:  Strength, flexibility, and endurance.  Cardiovascular fitness.  Learn and use proper training technique, including reducing running mileage, shortening stride, and avoiding running on hills and banked surfaces.  Wear arch supports (orthotics), if you have flat feet. PROGNOSIS  If treated properly, IT band syndrome usually goes away within 6 weeks of treatment. RELATED COMPLICATIONS   Longer healing time, if not properly treated, or if not given enough time to heal.  Recurring inflammation of the tendon and bursa, that may result in a chronic  condition.  Recurring symptoms, if activity is resumed too soon, with overuse, with a direct blow, or with poor training technique.  Inability to complete training or competition. TREATMENT  Treatment first involves the use of ice and medicine, to reduce pain and inflammation. The use of strengthening and stretching exercises may help reduce pain with activity. These exercises may be performed at home or with a therapist. For individuals with flat feet, an arch support (orthotic) may be helpful. Some individuals find that wearing a knee sleeve or compression bandage around the knee during workouts provides some relief. Certain training techniques, such as adjusting stride length, avoiding running on hills or stairs, changing the direction you run on a circular or banked track, or changing the side of the road you run on, if you run next to the curb, may help decrease symptoms of IT band syndrome. Cyclists may need to change the seat height or foot position on their bicycles. An injection of cortisone into the bursa may be recommended. Surgery to remove the inflamed bursa and/or part of the IT band is only considered after at least 6 months of non-surgical treatment.  MEDICATION   If pain medicine is needed, nonsteroidal anti-inflammatory medicines (aspirin and ibuprofen), or other minor pain relievers (acetaminophen), are often advised.  Do not take pain medicine for 7 days before surgery.  Prescription pain relievers may be given, if your caregiver thinks they are needed. Use only as directed and only as much as you need.  Corticosteroid injections may be given by your caregiver. These injections should be reserved for the most serious cases, because they may only be given a certain number of times. HEAT AND COLD  Cold treatment (icing) should be applied for 10 to 15 minutes every 2 to 3 hours for inflammation and pain, and immediately after activity that aggravates your symptoms. Use ice packs or  an ice massage.  Heat treatment may be used before performing stretching and strengthening activities prescribed by your caregiver, physical therapist, or athletic trainer. Use a heat pack or a warm water soak. SEEK MEDICAL CARE IF:   Symptoms get worse or do not improve in 2 to 4 weeks, despite treatment.  New, unexplained symptoms develop. (Drugs used in treatment may produce side effects.) EXERCISES  RANGE OF MOTION (ROM) AND STRETCHING EXERCISES - Iliotibial Band Syndrome These exercises may help you when beginning to  rehabilitate your injury. Your symptoms may go away with or without further involvement from your physician, physical therapist or athletic trainer. While completing these exercises, remember:   Restoring tissue flexibility helps normal motion to return to the joints. This allows healthier, less painful movement and activity.  An effective stretch should be held for at least 30 seconds.  A stretch should never be painful. You should only feel a gentle lengthening or release in the stretched tissue. STRETCH - Quadriceps, Prone   Lie on your stomach on a firm surface, such as a bed or padded floor.  Bend your right / left knee and grasp your ankle. If you are unable to reach your ankle or pant leg, use a belt around your foot to lengthen your reach.  Gently pull your heel toward your buttocks. Your knee should not slide out to the side. You should feel a stretch in the front of your thigh and knee.  Hold this position for __________ seconds. Repeat __________ times. Complete this stretch __________ times per day.  STRETCH - Iliotibial Band  On the floor or bed, lie on your side, so your right / left leg is on top. Bend your knee and grab your ankle.  Slowly bring your knee back so that your thigh is in line with your trunk. Keep your heel at your buttocks and gently arch your back, so your head, shoulders and hips line up.  Slowly lower your leg so that your knee  approaches the floor or bed, until you feel a gentle stretch on the outside of your right / left thigh. If you do not feel a stretch and your knee will not fall farther, place the heel of your opposite foot on top of your knee, and pull your thigh down farther.  Hold this stretch for __________ seconds. Repeat __________ times. Complete this stretch __________ times per day. STRENGTHENING EXERCISES - Iliotibial Band Syndrome Improving the flexibility of the IT band will best relieve your discomfort due to IT band syndrome. Strengthening exercises, however, can help improve both muscle endurance and joint mechanics, reducing the factors that can contribute to this condition. Your physician, physical therapist or athletic trainer may provide you with exercises that train specific muscle groups that are especially weak. The following exercises target muscles that are often weak in people who have IT band syndrome. STRENGTH - Hip Abductors, Straight Leg Raises  Be aware of your form throughout the entire exercise, so that you exercise the correct muscles. Poor form means that you are not strengthening the correct muscles.  Lie on your side, so that your head, shoulders, knee and hip line up. You may bend your lower knee to help maintain your balance. Your right / left leg should be on top.  Roll your hips slightly forward, so that your hips are stacked directly over each other and your right / left knee is facing forward.  Lift your top leg up 4-6 inches, leading with your heel. Be sure that your foot does not drift forward and that your knee does not roll toward the ceiling.  Hold this position for __________ seconds. You should feel the muscles in your outer hip lifting (you may not notice this until your leg begins to tire).  Slowly lower your leg to the starting position. Allow the muscles to fully relax before beginning the next repetition. Repeat __________ times. Complete this exercise  __________ times per day.  STRENGTH - Quad/VMO, Isometric  Sit in a chair  with your right / left knee slightly bent. With your fingertips, feel the VMO muscle (just above the inside of your knee). The VMO is important in controlling the position of your kneecap.  Keeping your fingertips on this muscle. Without actually moving your leg, attempt to drive your knee down, as if straightening your leg. You should feel your VMO tense. If you have a difficult time, you may wish to try the same exercise on your healthy knee first.  Tense this muscle as hard as you can, without increasing any knee pain.  Hold for __________ seconds. Relax the muscles slowly and completely between each repetition. Repeat __________ times. Complete this exercise __________ times per day.  Document Released: 03/28/2005 Document Revised: 06/20/2011 Document Reviewed: 07/10/2008 [facility] Patient Information 2015 [place], [place]. This information is not intended to replace advice given to you by your health care provider. Make sure you discuss any questions you have with your health care provider.

## 2014-06-25 NOTE — Progress Notes (Signed)
Assessment and Plan:  1. Prediabetes Stop sodas/juice, long discussion very close to DM Discussed general issues about diabetes pathophysiology and management., Educational material distributed., Suggested low cholesterol diet., Encouraged aerobic exercise., Discussed foot care., Reminded to get yearly retinal exam. - CBC with Differential/Platelet - BASIC METABOLIC PANEL WITH GFR - Hepatic function panel - Hemoglobin A1c - Insulin, fasting  2. Hyperlipidemia -continue medications, check lipids, decrease fatty foods, increase activity.  - Lipid panel  3. Obesity Obesity with co morbidities- long discussion about weight loss, diet, and exercise  4. Osteopenia May want to start on vitamin d RX - DG Bone Density; Future  5. Hypothyroidism, unspecified hypothyroidism type Hypothyroidism-check TSH level, continue medications the same, reminded to take on an empty stomach 30-55mins before food.  - TSH  6. Osteoarthritis, unspecified osteoarthritis type, unspecified site Left hip pain-? From lower back versus IT band, will give exercises, mobic, if not better will get Xray of left hip/back.   7. Medication management - Magnesium  8. Vitamin D deficiency May need prescription if still low due to osteopenia.  - Vit D  25 hydroxy (rtn osteoporosis monitoring)  Continue diet and meds as discussed. Further disposition pending results of labs. OVER 40 minutes of exam, counseling, chart review, referral performed   HPI 54 y.o. female  presents for 3 month follow up   has a past medical history of Hypothyroid; Hypercholesteremia; Hot flashes; Osteoarthritis; and Colon polyp.  Her blood pressure has been controlled at home, today their BP is BP: 110/78 mmHg  She does not workout. She denies chest pain, shortness of breath, dizziness.  She is on cholesterol medication and denies myalgias. Her cholesterol is at goal. The cholesterol last visit was:   Lab Results  Component Value Date    CHOL 173 03/13/2014   HDL 51 03/13/2014   LDLCALC 100* 03/13/2014   TRIG 110 03/13/2014   CHOLHDL 3.4 03/13/2014   She has been working on diet and exercise for prediabetes, and denies paresthesia of the feet, polydipsia, polyuria and visual disturbances. She does admit to drinking fruit juice and soda. Last A1C in the office was:  Lab Results  Component Value Date   HGBA1C 6.4* 03/13/2014  She has been having left hip pain, she states it locks on her occ, able to bear weight on it, no numbness/tingling, weakness, pain in her left leg. Had bone density in 2009 that showed osteopenia.  Patient is on Vitamin D supplement.   Lab Results  Component Value Date   VD25OH 30 03/13/2014    BMI is Body mass index is 32.37 kg/(m^2)., she is working on diet and exercise. Wt Readings from Last 3 Encounters:  06/25/14 177 lb (80.287 kg)  05/01/14 175 lb (79.379 kg)  03/13/14 174 lb (78.926 kg)   She is on thyroid medication. Her medication was not changed last visit.   Lab Results  Component Value Date   TSH 1.587 03/13/2014    Current Medications:  Current Outpatient Prescriptions on File Prior to Visit  Medication Sig Dispense Refill  . BIOTIN PO Take by mouth.    . Calcium Carbonate (CALCIUM 600 PO) Take by mouth daily.    . Cholecalciferol (VITAMIN D3) 5000 UNITS TABS Take 5,000 Units by mouth daily.    Marland Kitchen levothyroxine (SYNTHROID, LEVOTHROID) 150 MCG tablet TAKE 1 TABLET (150 MCG TOTAL) BY MOUTH DAILY. 90 tablet 1  . Magnesium 250 MG TABS Take by mouth daily.    . Multiple Vitamins-Minerals (HAIR/SKIN/NAILS PO)  Take by mouth daily.    . simvastatin (ZOCOR) 40 MG tablet TAKE 1 TABLET (40 MG TOTAL) BY MOUTH DAILY. 90 tablet 1  . vitamin E (VITAMIN E) 400 UNIT capsule Take 400 Units by mouth daily.    . Zinc 50 MG TABS Take by mouth daily.     No current facility-administered medications on file prior to visit.   Medical History:  Past Medical History  Diagnosis Date  . Hypothyroid    . Hypercholesteremia   . Hot flashes   . Osteoarthritis   . Colon polyp    Allergies:  Allergies  Allergen Reactions  . Cephalexin     Throat swelling     Review of Systems:  Review of Systems  Constitutional: Positive for malaise/fatigue. Negative for fever, chills, weight loss and diaphoresis.  HENT: Negative.   Respiratory: Negative.   Cardiovascular: Negative.   Gastrointestinal: Negative.   Genitourinary: Negative.  Negative for dysuria and frequency.  Musculoskeletal: Positive for back pain and joint pain (right hip). Negative for myalgias, falls and neck pain.  Skin: Negative.   Neurological: Negative.  Negative for weakness.  Psychiatric/Behavioral: Negative.     Family history- Review and unchanged Social history- Review and unchanged Physical Exam: BP 110/78 mmHg  Pulse 76  Temp(Src) 97.7 F (36.5 C)  Resp 16  Ht 5\' 2"  (1.575 m)  Wt 177 lb (80.287 kg)  BMI 32.37 kg/m2 Wt Readings from Last 3 Encounters:  06/25/14 177 lb (80.287 kg)  05/01/14 175 lb (79.379 kg)  03/13/14 174 lb (78.926 kg)   General Appearance: Well nourished, in no apparent distress. Eyes: PERRLA, EOMs, conjunctiva no swelling or erythema Sinuses: No Frontal/maxillary tenderness ENT/Mouth: Ext aud canals clear, TMs without erythema, bulging. No erythema, swelling, or exudate on post pharynx.  Tonsils not swollen or erythematous. Hearing normal.  Neck: Supple, thyroid normal.  Respiratory: Respiratory effort normal, BS equal bilaterally without rales, rhonchi, wheezing or stridor.  Cardio: RRR with no MRGs. Brisk peripheral pulses without edema.  Abdomen: Soft, + BS,  Non tender, no guarding, rebound, hernias, masses. Lymphatics: Non tender without lymphadenopathy.  Musculoskeletal: Full ROM, 5/5 strength,  Patient is able to ambulate well.  Gait is not antalgic.Left hip: positives: pain with external rotation of hip, pain with abduction otherwise full ROM. and negatives: no  tenderness, no pain with heel impact, pulses full without SI pain.Good distal sensations and pulses bilaterally. Skin: Warm, dry without rashes, lesions, ecchymosis.  Neuro: Cranial nerves intact. Normal muscle tone, no cerebellar symptoms. Psych: Awake and oriented X 3, normal affect, Insight and Judgment appropriate.    Quentin Mullingollier, Therin Vetsch, PA-C 3:48 PM [facility]

## 2014-06-26 LAB — HEPATIC FUNCTION PANEL
ALT: 27 U/L (ref 0–35)
AST: 21 U/L (ref 0–37)
Albumin: 4.6 g/dL (ref 3.5–5.2)
Alkaline Phosphatase: 44 U/L (ref 39–117)
BILIRUBIN DIRECT: 0.1 mg/dL (ref 0.0–0.3)
BILIRUBIN TOTAL: 0.5 mg/dL (ref 0.2–1.2)
Indirect Bilirubin: 0.4 mg/dL (ref 0.2–1.2)
Total Protein: 7.2 g/dL (ref 6.0–8.3)

## 2014-06-26 LAB — LIPID PANEL
CHOL/HDL RATIO: 4.4 ratio
CHOLESTEROL: 183 mg/dL (ref 0–200)
HDL: 42 mg/dL — ABNORMAL LOW (ref >=46)
LDL Cholesterol: 112 mg/dL — ABNORMAL HIGH (ref 0–99)
TRIGLYCERIDES: 147 mg/dL (ref <150)
VLDL: 29 mg/dL (ref 0–40)

## 2014-06-26 LAB — BASIC METABOLIC PANEL WITH GFR
BUN: 12 mg/dL (ref 6–23)
CALCIUM: 10 mg/dL (ref 8.4–10.5)
CO2: 29 meq/L (ref 19–32)
CREATININE: 1.15 mg/dL — AB (ref 0.50–1.10)
Chloride: 102 mEq/L (ref 96–112)
GFR, Est African American: 62 mL/min
GFR, Est Non African American: 54 mL/min — ABNORMAL LOW
GLUCOSE: 90 mg/dL (ref 70–99)
Potassium: 4 mEq/L (ref 3.5–5.3)
SODIUM: 140 meq/L (ref 135–145)

## 2014-06-26 LAB — VITAMIN D 25 HYDROXY (VIT D DEFICIENCY, FRACTURES): Vit D, 25-Hydroxy: 31 ng/mL (ref 30–100)

## 2014-06-26 LAB — HEMOGLOBIN A1C
HEMOGLOBIN A1C: 6.2 % — AB (ref <5.7)
MEAN PLASMA GLUCOSE: 131 mg/dL — AB (ref <117)

## 2014-06-26 LAB — MAGNESIUM: Magnesium: 2.2 mg/dL (ref 1.5–2.5)

## 2014-06-26 LAB — INSULIN, FASTING: Insulin fasting, serum: 19.8 u[IU]/mL — ABNORMAL HIGH (ref 2.0–19.6)

## 2014-06-26 LAB — TSH: TSH: 1.768 u[IU]/mL (ref 0.350–4.500)

## 2014-09-29 ENCOUNTER — Ambulatory Visit (HOSPITAL_COMMUNITY)
Admission: RE | Admit: 2014-09-29 | Discharge: 2014-09-29 | Disposition: A | Discharge status: Home / Self Care | Payer: 59 | Source: Ambulatory Visit | Attending: Physician Assistant | Admitting: Physician Assistant

## 2014-09-29 ENCOUNTER — Encounter: Payer: Self-pay | Admitting: Physician Assistant

## 2014-09-29 ENCOUNTER — Ambulatory Visit (INDEPENDENT_AMBULATORY_CARE_PROVIDER_SITE_OTHER): Payer: 59 | Admitting: Physician Assistant

## 2014-09-29 VITALS — BP 122/78 | HR 68 | Temp 97.7°F | Resp 16 | Ht 62.0 in | Wt 172.0 lb

## 2014-09-29 DIAGNOSIS — R7303 Prediabetes: Secondary | ICD-10-CM

## 2014-09-29 DIAGNOSIS — E039 Hypothyroidism, unspecified: Secondary | ICD-10-CM

## 2014-09-29 DIAGNOSIS — E559 Vitamin D deficiency, unspecified: Secondary | ICD-10-CM

## 2014-09-29 DIAGNOSIS — M25552 Pain in left hip: Secondary | ICD-10-CM

## 2014-09-29 DIAGNOSIS — E785 Hyperlipidemia, unspecified: Secondary | ICD-10-CM

## 2014-09-29 DIAGNOSIS — Z79899 Other long term (current) drug therapy: Secondary | ICD-10-CM

## 2014-09-29 DIAGNOSIS — R7309 Other abnormal glucose: Secondary | ICD-10-CM

## 2014-09-29 DIAGNOSIS — M160 Bilateral primary osteoarthritis of hip: Secondary | ICD-10-CM | POA: Diagnosis not present

## 2014-09-29 DIAGNOSIS — E669 Obesity, unspecified: Secondary | ICD-10-CM

## 2014-09-29 LAB — CBC WITH DIFFERENTIAL/PLATELET
BASOS ABS: 0 10*3/uL (ref 0.0–0.1)
BASOS PCT: 0 % (ref 0–1)
EOS PCT: 1 % (ref 0–5)
Eosinophils Absolute: 0.1 10*3/uL (ref 0.0–0.7)
HCT: 35.9 % — ABNORMAL LOW (ref 36.0–46.0)
Hemoglobin: 12 g/dL (ref 12.0–15.0)
LYMPHS PCT: 56 % — AB (ref 12–46)
Lymphs Abs: 4 10*3/uL (ref 0.7–4.0)
MCH: 27.8 pg (ref 26.0–34.0)
MCHC: 33.4 g/dL (ref 30.0–36.0)
MCV: 83.3 fL (ref 78.0–100.0)
MONO ABS: 0.5 10*3/uL (ref 0.1–1.0)
MPV: 10.2 fL (ref 8.6–12.4)
Monocytes Relative: 7 % (ref 3–12)
NEUTROS ABS: 2.6 10*3/uL (ref 1.7–7.7)
Neutrophils Relative %: 36 % — ABNORMAL LOW (ref 43–77)
PLATELETS: 273 10*3/uL (ref 150–400)
RBC: 4.31 MIL/uL (ref 3.87–5.11)
RDW: 14.5 % (ref 11.5–15.5)
WBC: 7.2 10*3/uL (ref 4.0–10.5)

## 2014-09-29 LAB — HEMOGLOBIN A1C
Hgb A1c MFr Bld: 6.2 % — ABNORMAL HIGH (ref <5.7)
Mean Plasma Glucose: 131 mg/dL — ABNORMAL HIGH (ref <117)

## 2014-09-29 NOTE — Patient Instructions (Addendum)
Benefiber is good for constipation/diarrhea/irritable bowel syndrome, it helps with weight loss and can help lower your bad cholesterol. Please do 1-2 TBSP in the morning in water, coffee, or tea. It can take up to a month before you can see a difference with your bowel movements. It is cheapest from [employer], [employer], [employer].     Vitamin D goal is between 60-80  Please make sure that you are taking your Vitamin D as directed.   It is very important as a natural anti-inflammatory   helping hair, skin, and nails, as well as reducing stroke and heart attack risk.   It helps your bones and helps with mood.  It also decreases numerous cancer risks so please take it as directed.   Low Vit D is associated with a 200-300% higher risk for CANCER   and 200-300% higher risk for HEART   ATTACK  &  STROKE.    .....................................Marland Kitchen  It is also associated with higher death rate at younger ages,   autoimmune diseases like Rheumatoid arthritis, Lupus, Multiple Sclerosis.     Also many other serious conditions, like depression, Alzheimer's  Dementia, infertility, muscle aches, fatigue, fibromyalgia - just to name a few.  +++++++++++++++++++  Before you even begin to attack a weight-loss plan, it pays to remember this: You are not fat. You have fat. Losing weight isn't about blame or shame; it's simply another achievement to accomplish. Dieting is like any other skill-you have to buckle down and work at it. As long as you act in a smart, reasonable way, you'll ultimately get where you want to be. Here are some weight loss pearls for you.  1. It's Not a Diet. It's a Lifestyle Thinking of a diet as something you're on and suffering through only for the short term doesn't work. To shed weight and keep it off, you need to make permanent changes to the way you eat. It's OK to indulge occasionally, of course, but if you cut calories temporarily and then revert to your old way of eating,  you'll gain back the weight quicker than you can say yo-yo. Use it to lose it. Research shows that one of the best predictors of long-term weight loss is how many pounds you drop in the first month. For that reason, nutritionists often suggest being stricter for the first two weeks of your new eating strategy to build momentum. Cut out added sugar and alcohol and avoid unrefined carbs. After that, figure out how you can reincorporate them in a way that's healthy and maintainable.  2. There's a Right Way to Exercise Working out burns calories and fat and boosts your metabolism by building muscle. But those trying to lose weight are notorious for overestimating the number of calories they burn and underestimating the amount they take in. Unfortunately, your system is biologically programmed to hold on to extra pounds and that means when you start exercising, your body senses the deficit and ramps up its hunger signals. If you're not diligent, you'll eat everything you burn and then some. Use it to lose it. Cardio gets all the exercise glory, but strength and interval training are the real heroes. They help you build lean muscle, which in turn increases your metabolism and calorie-burning ability 3. Don't Overreact to Mild Hunger Some people have a hard time losing weight because of hunger anxiety. To them, being hungry is bad-something to be avoided at all costs-so they carry snacks with them and eat when they don't need to.  Others eat because they're stressed out or bored. While you never want to get to the point of being ravenous (that's when bingeing is likely to happen), a hunger pang, a craving, or the fact that it's 3:00 p.m. should not send you racing for the vending machine or obsessing about the energy bar in your purse. Ideally, you should put off eating until your stomach is growling and it's difficult to concentrate.  Use it to lose it. When you feel the urge to eat, use the HALT method. Ask  yourself, Am I really hungry? Or am I angry or anxious, lonely or bored, or tired? If you're still not certain, try the apple test. If you're truly hungry, an apple should seem delicious; if it doesn't, something else is going on. Or you can try drinking water and making yourself busy, if you are still hungry try a healthy snack.  4. Not All Calories Are Created Equal The mechanics of weight loss are pretty simple: Take in fewer calories than you use for energy. But the kind of food you eat makes all the difference. Processed food that's high in saturated fat and refined starch or sugar can cause inflammation that disrupts the hormone signals that tell your brain you're full. The result: You eat a lot more.  Use it to lose it. Clean up your diet. Swap in whole, unprocessed foods, including vegetables, lean protein, and healthy fats that will fill you up and give you the biggest nutritional bang for your calorie buck. In a few weeks, as your brain starts receiving regular hunger and fullness signals once again, you'll notice that you feel less hungry overall and naturally start cutting back on the amount you eat.  5. Protein, Produce, and Plant-Based Fats Are Your Weight-Loss Trinity Here's why eating the three Ps regularly will help you drop pounds. Protein fills you up. You need it to build lean muscle, which keeps your metabolism humming so that you can torch more fat. People in a weight-loss program who ate double the recommended daily allowance for protein (about 110 grams for a 150-pound woman) lost 70 percent of their weight from fat, while people who ate the RDA lost only about 40 percent, one study found. Produce is packed with filling fiber. "It's very difficult to consume too many calories if you're eating a lot of vegetables. Example: Three cups of broccoli is a lot of food, yet only 93 calories. (Fruit is another story. It can be easy to overeat and can contain a lot of calories from sugar, so be  sure to monitor your intake.) Plant-based fats like olive oil and those in avocados and nuts are healthy and extra satiating.  Use it to lose it. Aim to incorporate each of the three Ps into every meal and snack. People who eat protein throughout the day are able to keep weight off, according to a study in the American Journal of Clinical Nutrition. In addition to meat, poultry and seafood, good sources are beans, lentils, eggs, tofu, and yogurt. As for fat, keep portion sizes in check by measuring out salad dressing, oil, and nut butters (shoot for one to two tablespoons). Finally, eat veggies or a little fruit at every meal. People who did that consumed 308 fewer calories but didn't feel any hungrier than when they didn't eat more produce.  7. How You Eat Is As Important As What You Eat In order for your brain to register that you're full, you need to focus on  what you're eating. Sit down whenever you eat, preferably at a table. Turn off the TV or computer, put down your phone, and look at your food. Smell it. Chew slowly, and don't put another bite on your fork until you swallow. When women ate lunch this attentively, they consumed 30 percent less when snacking later than those who listened to an audiobook at lunchtime, according to a study in the [place] Journal of Nutrition. 8. Weighing Yourself Really Works The scale provides the best evidence about whether your efforts are paying off. Seeing the numbers tick up or down or stagnate is motivation to keep going-or to rethink your approach. A 2015 study at [facility] found that daily weigh-ins helped people lose more weight, keep it off, and maintain that loss, even after two years. Use it to lose it. Step on the scale at the same time every day for the best results. If your weight shoots up several pounds from one weigh-in to the next, don't freak out. Eating a lot of salt the night before or having your period is the likely culprit. The number  should return to normal in a day or two. It's a steady climb that you need to do something about. 9. Too Much Stress and Too Little Sleep Are Your Enemies When you're tired and frazzled, your body cranks up the production of cortisol, the stress hormone that can cause carb cravings. Not getting enough sleep also boosts your levels of ghrelin, a hormone associated with hunger, while suppressing leptin, a hormone that signals fullness and satiety. People on a diet who slept only five and a half hours a night for two weeks lost 55 percent less fat and were hungrier than those who slept eight and a half hours, according to a study in the [place] [employer] Journal. Use it to lose it. Prioritize sleep, aiming for seven hours or more a night, which research shows helps lower stress. And make sure you're getting quality zzz's. If a snoring spouse or a fidgety cat wakes you up frequently throughout the night, you may end up getting the equivalent of just four hours of sleep, according to a study from [facility]. Keep pets out of the bedroom, and use a white-noise app to drown out snoring. 10. You Will Hit a plateau-And You Can Bust Through It As you slim down, your body releases much less leptin, the fullness hormone.  If you're not strength training, start right now. Building muscle can raise your metabolism to help you overcome a plateau. To keep your body challenged and burning calories, incorporate new moves and more intense intervals into your workouts or add another sweat session to your weekly routine. Alternatively, cut an extra 100 calories or so a day from your diet. Now that you've lost weight, your body simply doesn't need as much fuel.   Ways to cut 100 calories  1. Eat your eggs with hot sauce OR salsa instead of cheese.  Eggs are great for breakfast, but many people consider eggs and cheese to be BFFs. Instead of cheese-1 oz. of cheddar has 114 calories-top your eggs with hot  sauce, which contains no calories and helps with satiety and metabolism. Salsa is also a great option!!  2. Top your toast, waffles or pancakes with mashed berries instead of jelly or syrup. Half a cup of berries-fresh, frozen or thawed-has about 40 calories, compared with 2 tbsp. of maple syrup or jelly, which both have about 100 calories. The berries  will also give you a good punch of fiber, which helps keep you full and satisfied and won't spike blood sugar quickly like the jelly or syrup. 3. Swap the non-fat latte for black coffee with a splash of half-and-half. Contrary to its name, that non-fat latte has 130 calories and a startling 19g of carbohydrates per 16 oz. serving. Replacing that 'light' drinkable dessert with a black coffee with a splash of half-and-half saves you more than 100 calories per 16 oz. serving. 4. Sprinkle salads with freeze-dried raspberries instead of dried cranberries. If you want a sweet addition to your nutritious salad, stay away from dried cranberries. They have a whopping 130 calories per  cup and 30g carbohydrates. Instead, sprinkle freeze-dried raspberries guilt-free and save more than 100 calories per  cup serving, adding 3g of belly-filling fiber. 5. Go for mustard in place of mayo on your sandwich. Mustard can add really nice flavor to any sandwich, and there are tons of varieties, from spicy to honey. A serving of mayo is 95 calories, versus 10 calories in a serving of mustard. 6. Choose a DIY salad dressing instead of the store-bought kind. Mix Dijon or whole grain mustard with low-fat Kefir or red wine vinegar and garlic. 7. Use hummus as a spread instead of a dip. Use hummus as a spread on a high-fiber cracker or tortilla with a sandwich and save on calories without sacrificing taste. 8. Pick just one salad "accessory." Salad isn't automatically a calorie winner. It's easy to over-accessorize with toppings. Instead of topping your salad with nuts, avocado  and cranberries (all three will clock in at 313 calories), just pick one. The next day, choose a different accessory, which will also keep your salad interesting. You don't wear all your jewelry every day, right? 9. Ditch the white pasta in favor of spaghetti squash. One cup of cooked spaghetti squash has about 40 calories, compared with traditional spaghetti, which comes with more than 200. Spaghetti squash is also nutrient-dense. It's a good source of fiber and Vitamins A and C, and it can be eaten just like you would eat pasta-with a great tomato sauce and [place] meatballs or with pesto, tofu and spinach, for example. 10. Dress up your chili, soups and stews with non-fat [place] yogurt instead of sour cream. Just a 'dollop' of sour cream can set you back 115 calories and a whopping 12g of fat-seven of which are of the artery-clogging variety. Added bonus: [place] yogurt is packed with muscle-building protein, calcium and B Vitamins. 11. Mash cauliflower instead of mashed potatoes. One cup of traditional mashed potatoes-in all their creamy goodness-has more than 200 calories, compared to mashed cauliflower, which you can typically eat for less than 100 calories per 1 cup serving. Cauliflower is a great source of the antioxidant indole-3-carbinol (I3C), which may help reduce the risk of some cancers, like breast cancer. 12. Ditch the ice cream sundae in favor of a [place] yogurt parfait. Instead of a cup of ice cream or fro-yo for dessert, try 1 cup of nonfat Greek yogurt topped with fresh berries and a sprinkle of cacao nibs. Both toppings are packed with antioxidants, which can help reduce cellular inflammation and oxidative damage. And the comparison is a no-brainer: One cup of ice cream has about 275 calories; one cup of frozen yogurt has about 230; and a cup of Greek yogurt has just 130, plus twice the protein, so you're less likely to return to the freezer for a second helping. 13. Put  olive oil in a spray  container instead of using it directly from the bottle. Each tablespoon of olive oil is 120 calories and 15g of fat. Use a mister instead of pouring it straight into the pan or onto a salad. This allows for portion control and will save you more than 100 calories. 14. When baking, substitute canned pumpkin for butter or oil. Canned pumpkin-not pumpkin pie mix-is loaded with Vitamin A, which is important for skin and eye health, as well as immunity. And the comparisons are pretty crazy:  cup of canned pumpkin has about 40 calories, compared to butter or oil, which has more than 800 calories. Yes, 800 calories. Applesauce and mashed banana can also serve as good substitutions for butter or oil, usually in a 1:1 ratio. 15. Top casseroles with high-fiber cereal instead of breadcrumbs. Breadcrumbs are typically made with white bread, while breakfast cereals contain 5-9g of fiber per serving. Not only will you save more than 150 calories per  cup serving, the swap will also keep you more full and you'll get a metabolism boost from the added fiber. 16. Snack on pistachios instead of macadamia nuts. Believe it or not, you get the same amount of calories from 35 pistachios (100 calories) as you would from only five macadamia nuts. 17. Chow down on kale chips rather than potato chips. This is my favorite 'don't knock it 'till you try it' swap. Kale chips are so easy to make at home, and you can spice them up with a little grated parmesan or chili powder. Plus, they're a mere fraction of the calories of potato chips, but with the same crunch factor we crave so often. 18. Add seltzer and some fruit slices to your cocktail instead of soda or fruit juice. One cup of soda or fruit juice can pack on as much as 140 calories. Instead, use seltzer and fruit slices. The fruit provides valuable phytochemicals, such as flavonoids and anthocyanins, which help to combat cancer and stave off the aging process.

## 2014-09-29 NOTE — Progress Notes (Signed)
Assessment and Plan:  1. Prediabetes Stop sodas/juice, long discussion very close to DM Discussed general issues about diabetes pathophysiology and management., Educational material distributed., Suggested low cholesterol diet., Encouraged aerobic exercise., Discussed foot care., Reminded to get yearly retinal exam. - CBC with Differential/Platelet - BASIC METABOLIC PANEL WITH GFR - Hepatic function panel - Hemoglobin A1c - Insulin, fasting  2. Hyperlipidemia -continue medications, check lipids, decrease fatty foods, increase activity.  - Lipid panel  3. Obesity Obesity with co morbidities- long discussion about weight loss, diet, and exercise  4. Hypothyroidism, unspecified hypothyroidism type Hypothyroidism-check TSH level, continue medications the same, reminded to take on an empty stomach 30-46mins before food.  - TSH  5. Medication management - Magnesium  6. Vitamin D deficiency May need prescription if still low due to osteopenia.  - Vit D  25 hydroxy (rtn osteoporosis monitoring)  7. Left hip pain Get Xray, may refer to PT/ortho pending Xray, continue meloxicam PRN  HPI 54 y.o. female  presents for 3 month follow up   has a past medical history of Hypothyroid; Hypercholesteremia; Hot flashes; Osteoarthritis; and Colon polyp.  Her blood pressure has been controlled at home, today their BP is BP: 122/78 mmHg  She does not workout. She denies chest pain, shortness of breath, dizziness.  She is on cholesterol medication and denies myalgias. Her cholesterol is at goal. The cholesterol last visit was:   Lab Results  Component Value Date   CHOL 183 06/25/2014   HDL 42* 06/25/2014   LDLCALC 112* 06/25/2014   TRIG 147 06/25/2014   CHOLHDL 4.4 06/25/2014   She has been working on diet and exercise for prediabetes, and denies paresthesia of the feet, polydipsia, polyuria and visual disturbances. She does admit to drinking fruit juice and soda. Last A1C in the office was:  Lab  Results  Component Value Date   HGBA1C 6.2* 06/25/2014  Patient is not on Vitamin D supplement.   Lab Results  Component Value Date   VD25OH 31 06/25/2014    BMI is Body mass index is 31.45 kg/(m^2)., she is working on diet and exercise. Wt Readings from Last 3 Encounters:  09/29/14 172 lb (78.019 kg)  06/25/14 177 lb (80.287 kg)  05/01/14 175 lb (79.379 kg)   She is on thyroid medication. Her medication was not changed last visit.  Lab Results  Component Value Date   TSH 1.768 06/25/2014    Current Medications:  Current Outpatient Prescriptions on File Prior to Visit  Medication Sig Dispense Refill  . BIOTIN PO Take by mouth.    . Calcium Carbonate (CALCIUM 600 PO) Take by mouth daily.    . Cholecalciferol (VITAMIN D3) 5000 UNITS TABS Take 5,000 Units by mouth daily.    Marland Kitchen levothyroxine (SYNTHROID, LEVOTHROID) 150 MCG tablet TAKE 1 TABLET (150 MCG TOTAL) BY MOUTH DAILY. 90 tablet 1  . Magnesium 250 MG TABS Take by mouth daily.    . meloxicam (MOBIC) 15 MG tablet Take 1 pill daily with food as needed for pain, can take with tylenol but not with aleve, ibuprofen etc 90 tablet 0  . Multiple Vitamins-Minerals (HAIR/SKIN/NAILS PO) Take by mouth daily.    . simvastatin (ZOCOR) 40 MG tablet TAKE 1 TABLET (40 MG TOTAL) BY MOUTH DAILY. 90 tablet 1  . vitamin E (VITAMIN E) 400 UNIT capsule Take 400 Units by mouth daily.    . Zinc 50 MG TABS Take by mouth daily.     No current facility-administered medications on file  prior to visit.   Medical History:  Past Medical History  Diagnosis Date  . Hypothyroid   . Hypercholesteremia   . Hot flashes   . Osteoarthritis   . Colon polyp    Allergies:  Allergies  Allergen Reactions  . Cephalexin     Throat swelling     Review of Systems:  Review of Systems  Constitutional: Positive for malaise/fatigue. Negative for fever, chills, weight loss and diaphoresis.  HENT: Negative.   Respiratory: Negative.   Cardiovascular: Negative.    Gastrointestinal: Negative.   Genitourinary: Negative.  Negative for dysuria and frequency.  Musculoskeletal: Positive for back pain and joint pain (left hip). Negative for myalgias, falls and neck pain.  Skin: Negative.   Neurological: Negative.  Negative for weakness.  Psychiatric/Behavioral: Negative.     Family history- Review and unchanged Social history- Review and unchanged Physical Exam: BP 122/78 mmHg  Pulse 68  Temp(Src) 97.7 F (36.5 C)  Resp 16  Ht  (1.575 m)  Wt 172 lb (78.019 kg)  BMI 31.45 kg/m2 Wt Readings from Last 3 Encounters:  09/29/14 172 lb (78.019 kg)  06/25/14 177 lb (80.287 kg)  05/01/14 175 lb (79.379 kg)   General Appearance: Well nourished, in no apparent distress. Eyes: PERRLA, EOMs, conjunctiva no swelling or erythema Sinuses: No Frontal/maxillary tenderness ENT/Mouth: Ext aud canals clear, TMs without erythema, bulging. No erythema, swelling, or exudate on post pharynx.  Tonsils not swollen or erythematous. Hearing normal.  Neck: Supple, thyroid normal.  Respiratory: Respiratory effort normal, BS equal bilaterally without rales, rhonchi, wheezing or stridor.  Cardio: RRR with no MRGs. Brisk peripheral pulses without edema.  Abdomen: Soft, + BS,  Non tender, no guarding, rebound, hernias, masses. Lymphatics: Non tender without lymphadenopathy.  Musculoskeletal: Full ROM, 5/5 strength,  Patient is able to ambulate well.  Gait is not antalgic.Left hip: positives: pain with external rotation of hip, pain with abduction otherwise full ROM. and negatives: no tenderness, no pain with heel impact, pulses full without SI pain.Good distal sensations and pulses bilaterally. Skin: Warm, dry without rashes, lesions, ecchymosis.  Neuro: Cranial nerves intact. Normal muscle tone, no cerebellar symptoms. Psych: Awake and oriented X 3, normal affect, Insight and Judgment appropriate.    Quentin Mulling, PA-C 3:38 PM [facility]  [facility]

## 2014-09-30 LAB — LIPID PANEL
Cholesterol: 200 mg/dL (ref 0–200)
HDL: 47 mg/dL (ref >=46)
LDL Cholesterol: 132 mg/dL — ABNORMAL HIGH (ref 0–99)
TRIGLYCERIDES: 103 mg/dL (ref <150)
Total CHOL/HDL Ratio: 4.3 Ratio
VLDL: 21 mg/dL (ref 0–40)

## 2014-09-30 LAB — BASIC METABOLIC PANEL WITH GFR
BUN: 6 mg/dL (ref 6–23)
CHLORIDE: 103 meq/L (ref 96–112)
CO2: 29 meq/L (ref 19–32)
Calcium: 9.9 mg/dL (ref 8.4–10.5)
Creat: 1.1 mg/dL (ref 0.50–1.10)
GFR, EST AFRICAN AMERICAN: 65 mL/min
GFR, Est Non African American: 57 mL/min — ABNORMAL LOW
Glucose, Bld: 84 mg/dL (ref 70–99)
POTASSIUM: 3.9 meq/L (ref 3.5–5.3)
SODIUM: 141 meq/L (ref 135–145)

## 2014-09-30 LAB — HEPATIC FUNCTION PANEL
ALK PHOS: 41 U/L (ref 39–117)
ALT: 24 U/L (ref 0–35)
AST: 25 U/L (ref 0–37)
Albumin: 4.3 g/dL (ref 3.5–5.2)
BILIRUBIN INDIRECT: 0.4 mg/dL (ref 0.2–1.2)
Bilirubin, Direct: 0.1 mg/dL (ref 0.0–0.3)
Total Bilirubin: 0.5 mg/dL (ref 0.2–1.2)
Total Protein: 7.4 g/dL (ref 6.0–8.3)

## 2014-09-30 LAB — VITAMIN D 25 HYDROXY (VIT D DEFICIENCY, FRACTURES): Vit D, 25-Hydroxy: 37 ng/mL (ref 30–100)

## 2014-09-30 LAB — INSULIN, FASTING: Insulin fasting, serum: 7.4 u[IU]/mL (ref 2.0–19.6)

## 2014-09-30 LAB — TSH: TSH: 26.385 u[IU]/mL — ABNORMAL HIGH (ref 0.350–4.500)

## 2014-09-30 LAB — MAGNESIUM: Magnesium: 1.9 mg/dL (ref 1.5–2.5)

## 2014-10-17 ENCOUNTER — Other Ambulatory Visit: Payer: Self-pay

## 2014-10-17 DIAGNOSIS — Z1231 Encounter for screening mammogram for malignant neoplasm of breast: Secondary | ICD-10-CM

## 2014-10-22 ENCOUNTER — Ambulatory Visit: Payer: 59

## 2014-10-31 ENCOUNTER — Ambulatory Visit
Admission: RE | Admit: 2014-10-31 | Discharge: 2014-10-31 | Disposition: A | Discharge status: Home / Self Care | Payer: 59 | Source: Ambulatory Visit

## 2014-10-31 DIAGNOSIS — Z1231 Encounter for screening mammogram for malignant neoplasm of breast: Secondary | ICD-10-CM

## 2015-02-23 ENCOUNTER — Other Ambulatory Visit: Payer: Self-pay | Admitting: Internal Medicine

## 2015-03-25 ENCOUNTER — Ambulatory Visit (INDEPENDENT_AMBULATORY_CARE_PROVIDER_SITE_OTHER): Payer: 59 | Admitting: Physician Assistant

## 2015-03-25 ENCOUNTER — Encounter: Payer: Self-pay | Admitting: Physician Assistant

## 2015-03-25 ENCOUNTER — Other Ambulatory Visit (HOSPITAL_COMMUNITY)
Admission: RE | Admit: 2015-03-25 | Discharge: 2015-03-25 | Disposition: A | Discharge status: Home / Self Care | Payer: 59 | Source: Ambulatory Visit | Attending: Physician Assistant | Admitting: Physician Assistant

## 2015-03-25 VITALS — BP 122/78 | HR 70 | Temp 97.5°F | Ht 62.0 in | Wt 168.0 lb

## 2015-03-25 DIAGNOSIS — E559 Vitamin D deficiency, unspecified: Secondary | ICD-10-CM

## 2015-03-25 DIAGNOSIS — E785 Hyperlipidemia, unspecified: Secondary | ICD-10-CM

## 2015-03-25 DIAGNOSIS — R7303 Prediabetes: Secondary | ICD-10-CM

## 2015-03-25 DIAGNOSIS — Z1151 Encounter for screening for human papillomavirus (HPV): Secondary | ICD-10-CM | POA: Diagnosis present

## 2015-03-25 DIAGNOSIS — Z79899 Other long term (current) drug therapy: Secondary | ICD-10-CM

## 2015-03-25 DIAGNOSIS — Z124 Encounter for screening for malignant neoplasm of cervix: Secondary | ICD-10-CM | POA: Diagnosis not present

## 2015-03-25 DIAGNOSIS — M8580 Other specified disorders of bone density and structure, unspecified site: Secondary | ICD-10-CM

## 2015-03-25 DIAGNOSIS — R930 Abnormal findings on diagnostic imaging of skull and head, not elsewhere classified: Secondary | ICD-10-CM

## 2015-03-25 DIAGNOSIS — K635 Polyp of colon: Secondary | ICD-10-CM

## 2015-03-25 DIAGNOSIS — Z01419 Encounter for gynecological examination (general) (routine) without abnormal findings: Secondary | ICD-10-CM | POA: Diagnosis present

## 2015-03-25 DIAGNOSIS — I10 Essential (primary) hypertension: Secondary | ICD-10-CM

## 2015-03-25 DIAGNOSIS — Z0000 Encounter for general adult medical examination without abnormal findings: Secondary | ICD-10-CM

## 2015-03-25 DIAGNOSIS — E039 Hypothyroidism, unspecified: Secondary | ICD-10-CM

## 2015-03-25 DIAGNOSIS — E669 Obesity, unspecified: Secondary | ICD-10-CM

## 2015-03-25 DIAGNOSIS — Z1389 Encounter for screening for other disorder: Secondary | ICD-10-CM

## 2015-03-25 DIAGNOSIS — M1990 Unspecified osteoarthritis, unspecified site: Secondary | ICD-10-CM

## 2015-03-25 DIAGNOSIS — Z0001 Encounter for general adult medical examination with abnormal findings: Secondary | ICD-10-CM

## 2015-03-25 DIAGNOSIS — Z1159 Encounter for screening for other viral diseases: Secondary | ICD-10-CM

## 2015-03-25 NOTE — Patient Instructions (Signed)
    Bad carbs also include fruit juice, alcohol, and sweet tea. These are empty calories that do not signal to your brain that you are full.   Please remember the good carbs are still carbs which convert into sugar. So please measure them out no more than 1/2-1 cup of rice, oatmeal, pasta, and beans  Veggies are however free foods! Pile them on.   Not all fruit is created equal. Please see the list below, the fruit at the bottom is higher in sugars than the fruit at the top. Please avoid all dried fruits.     We want weight loss that will last so you should lose 1-2 pounds a week.  THAT IS IT! Please pick THREE things a month to change. Once it is a habit check off the item. Then pick another three items off the list to become habits.  If you are already doing a habit on the list GREAT!  Cross that item off! o Don't drink your calories. Ie, alcohol, soda, fruit juice, and sweet tea.  o Drink more water. Drink a glass when you feel hungry or before each meal.  o Eat breakfast - Complex carb and protein (likeDannon light and fit yogurt, oatmeal, fruit, eggs, turkey bacon). o Measure your cereal.  Eat no more than one cup a day. (ie Kashi) o Eat an apple a day. o Add a vegetable a day. o Try a new vegetable a month. o Use Pam! Stop using oil or butter to cook. o Don't finish your plate or use smaller plates. o Share your dessert. o Eat sugar free Jello for dessert or frozen grapes. o Don't eat 2-3 hours before bed. o Switch to whole wheat bread, pasta, and brown rice. o Make healthier choices when you eat out. No fries! o Pick baked chicken, NOT fried. o Don't forget to SLOW DOWN when you eat. It is not going anywhere.  o Take the stairs. o Park far away in the parking lot o Lift soup cans (or weights) for 10 minutes while watching TV. o Walk at work for 10 minutes during break. o Walk outside 1 time a week with your friend, kids, dog, or significant other. o Start a walking group at  church. o Walk the mall as much as you can tolerate.  o Keep a food diary. o Weigh yourself daily. o Walk for 15 minutes 3 days per week. o Cook at home more often and eat out less.  If life happens and you go back to old habits, it is okay.  Just start over. You can do it!   If you experience chest pain, get short of breath, or tired during the exercise, please stop immediately and inform your doctor.  .  

## 2015-03-25 NOTE — Progress Notes (Signed)
Complete Physical  Assessment and Plan: 1. Hypothyroidism, unspecified hypothyroidism type Hypothyroidism-check TSH level, continue medications the same, reminded to take on an empty stomach 30-6360mins before food.  - TSH  2. Hyperlipidemia -continue medications, check lipids, decrease fatty foods, increase activity.  - CBC with Differential/Platelet - BASIC METABOLIC PANEL WITH GFR - Hepatic function panel - Lipid panel - EKG 12-Lead  3. Obesity  long discussion about weight loss, diet, and exercise  4. Prediabetes Continue weight loss Discussed general issues about diabetes pathophysiology and management.  Agricultural engineerducational material distributed.  Encouraged aerobic exercise. - Hemoglobin A1c - Insulin, fasting  5. Osteopenia DEXA 2 years  6. Osteoarthritis, unspecified osteoarthritis type, unspecified site controlled  7. Colon polyp Repeat 2020  8. Medication management - Magnesium  9. Vitamin D deficiency - VITAMIN D 25 Hydroxy (Vit-D Deficiency, Fractures)  10. Screening for cervical cancer - Cytology - PAP  11. Screening for blood or protein in urine - Urinalysis, Rout[facility] w reflex microscopic ([facility]) - Microalbumin / creatinine urine ratio  12. Encounter for general adult medical examination with abnormal findings - CBC with Differential/Platelet - BASIC METABOLIC PANEL WITH GFR - Hepatic function panel - TSH - Lipid panel - Hemoglobin A1c - Insulin, fasting - Magnesium - VITAMIN D 25 Hydroxy (Vit-D Deficiency, Fractures) - Urinalysis, Routine w reflex microscopic ([facility]) - Microalbumin / creatinine urine ratio - EKG 12-Lead - Cytology - PAP  13. Screening for viral disease - Hepatitis C antibody - HIV antibody  14. Abnormal MRI of head Had MRI 2004 and 2014, both showed cyst, unchanged in size, will monitor for symptoms  Discussed med's effects and SE's. Screening labs and tests as requested with regular follow-up as recommended. Over  40 minutes of exam, counseling, chart review, and complex, high level critical decision making was performed this visit.   HPI  54 y.o. female  presents for a complete physical.  Her blood pressure has been controlled at home, today their BP is BP: 122/78 mmHg She does workout, walking for 30 mins at lunch everyday. She denies chest pain, shortness of breath, dizziness.  She is on cholesterol medication and denies myalgias. Her cholesterol is at goal. The cholesterol last visit was:   Lab Results  Component Value Date   CHOL 200 09/29/2014   HDL 47 09/29/2014   LDLCALC 132* 09/29/2014   TRIG 103 09/29/2014   CHOLHDL 4.3 09/29/2014   She has been working on diet and exercise for prediabetes, and denies paresthesia of the feet, polydipsia, polyuria and visual disturbances. Last A1C in the office was:  Lab Results  Component Value Date   HGBA1C 6.2* 09/29/2014   Last GFR: Lab Results  Component Value Date   GFRAA 65 09/29/2014   Patient is on Vitamin D supplement.   Lab Results  Component Value Date   VD25OH 37 09/29/2014     She is on thyroid medication. Her medication was not changed last visit, she just was not taking it, so she is back to taking it 1 pill every other day.  Lab Results  Component Value Date   TSH 26.385* 09/29/2014   BMI is Body mass index is 30.72 kg/(m^2)., she is working on diet and exercise. Wt Readings from Last 3 Encounters:  03/25/15 168 lb (76.204 kg)  09/29/14 172 lb (78.019 kg)  06/25/14 177 lb (80.287 kg)   Current Medications:  Current Outpatient Prescriptions on File Prior to Visit  Medication Sig Dispense Refill  .  BIOTIN PO Take by mouth.    . Calcium Carbonate (CALCIUM 600 PO) Take by mouth daily.    . Cholecalciferol (VITAMIN D3) 5000 UNITS TABS Take 5,000 Units by mouth daily.    Marland Kitchen latanoprost (XALATAN) 0.005 % ophthalmic solution INSTILL 1 DROP IN BOTH EYES AT BEDTIME.  4  . levothyroxine (SYNTHROID, LEVOTHROID) 150 MCG tablet TAKE  1 TABLET (150 MCG TOTAL) BY MOUTH DAILY. 90 tablet 0  . Magnesium 250 MG TABS Take by mouth daily.    . meloxicam (MOBIC) 15 MG tablet Take 1 pill daily with food as needed for pain, can take with tylenol but not with aleve, ibuprofen etc 90 tablet 0  . Multiple Vitamins-Minerals (HAIR/SKIN/NAILS PO) Take by mouth daily.    . simvastatin (ZOCOR) 40 MG tablet TAKE 1 TABLET (40 MG TOTAL) BY MOUTH DAILY. 90 tablet 1  . vitamin E (VITAMIN E) 400 UNIT capsule Take 400 Units by mouth daily.    . Zinc 50 MG TABS Take by mouth daily.     No current facility-administered medications on file prior to visit.   Health Maintenance:   Immunization History  Administered Date(s) Administered  . PPD Test 03/13/2014  . Tdap 04/11/2006   Tetanus: 2008 Pneumovax: N/A Prevnar 13: N/A Flu vaccine: declines Zostavax: N/A LMP: Hysterectomy Pap: 2014 due this year MGM: 10/2014 DEXA: 2012 WNL Colonoscopy: 11/2013, repeat 2020 EGD: N/A  Patient Care Team: Lucky Cowboy, MD as PCP - General (Internal Medicine) Francene Boyers, MD as Referring Physician (Optometry) Rollene Rotunda, MD as Consulting Physician (Cardiology) Charna Elizabeth, MD as Consulting Physician (Gastroenterology) Marcene Corning, MD as Consulting Physician (Orthopedic Surgery)  Allergies:  Allergies  Allergen Reactions  . Cephalexin     Throat swelling   Medical History:  Past Medical History  Diagnosis Date  . Hypothyroid   . Hypercholesteremia   . Hot flashes   . Osteoarthritis   . Colon polyp    Surgical History:  Past Surgical History  Procedure Laterality Date  . Cesarean section  80 & 82  . Vaginal hysterectomy  2002   Family History:  Family History  Problem Relation Age of Onset  . Cancer Mother   . Hypertension Mother   . Heart disease Mother   . Diabetes Mother   . Hypertension Father   . Heart disease Father   . Diabetes Father   . Cancer Maternal Aunt   . Cancer Paternal Aunt   . Cancer Maternal  Aunt   . Cancer Paternal Aunt   . Cancer Paternal Aunt   . Cancer Paternal Aunt    Social History:  Social History  Substance Use Topics  . Smoking status: Never Smoker   . Smokeless tobacco: Never Used  . Alcohol Use: No   Review of Systems: Review of Systems  Constitutional: Negative.   HENT: Negative.   Eyes: Negative.   Respiratory: Negative.   Cardiovascular: Negative.   Gastrointestinal: Negative.   Genitourinary: Negative.   Musculoskeletal: Negative.   Skin: Negative.   Neurological: Negative.   Endo/Heme/Allergies: Negative.   Psychiatric/Behavioral: Negative.     Physical Exam: Estimated body mass index is 30.72 kg/(m^2) as calculated from the following:   Height as of this encounter:  (1.575 m).   Weight as of this encounter: 168 lb (76.204 kg). BP 122/78 mmHg  Pulse 70  Temp(Src) 97.5 F (36.4 C) (Temporal)  Ht  (1.575 m)  Wt 168 lb (76.204 kg)  BMI 30.72 kg/m2  SpO2 98% General Appearance: Well nourished, in no apparent distress.  Eyes: PERRLA, EOMs, conjunctiva no swelling or erythema, normal fundi and vessels.  Sinuses: No Frontal/maxillary tenderness  ENT/Mouth: Ext aud canals clear, normal light reflex with TMs without erythema, bulging. Good dentition. No erythema, swelling, or exudate on post pharynx. Tonsils not swollen or erythematous. Hearing normal.  Neck: Supple, thyroid normal. No bruits  Respiratory: Respiratory effort normal, BS equal bilaterally without rales, rhonchi, wheezing or stridor.  Cardio: RRR without murmurs, rubs or gallops. Brisk peripheral pulses without edema.  Chest: symmetric, with normal excursions and percussion.  Breasts: Symmetric, without lumps, nipple discharge, retractions.  Abdomen: Soft, nontender, no guarding, rebound, hernias, masses, or organomegaly.  Lymphatics: Non tender without lymphadenopathy.  Genitourinary: VAGINA: atrophic, vaginal tenderness, exam limited by pain/body habitus, no discharge,  lesions, mass. Pap sent off. Musculoskeletal: Full ROM all peripheral extremities,5/5 strength, and normal gait.  Skin: Warm, dry without rashes, lesions, ecchymosis. Neuro: Cranial nerves intact, reflexes equal bilaterally. Normal muscle tone, no cerebellar symptoms. Sensation intact.  Psych: Awake and oriented X 3, normal affect, Insight and Judgment appropriate.   EKG: WNL no ST changes. AORTA SCAN: defer  Quentin Mulling 2:08 PM [facility]

## 2015-03-26 LAB — URINALYSIS, ROUTINE W REFLEX MICROSCOPIC
BILIRUBIN URINE: NEGATIVE
Glucose, UA: NEGATIVE
Hgb urine dipstick: NEGATIVE
KETONES UR: NEGATIVE
Leukocytes, UA: NEGATIVE
Nitrite: NEGATIVE
PH: 5.5 (ref 5.0–8.0)
Protein, ur: NEGATIVE
Specific Gravity, Urine: 1.014 (ref 1.001–1.035)

## 2015-03-26 LAB — BASIC METABOLIC PANEL WITH GFR
BUN: 11 mg/dL (ref 7–25)
CO2: 25 mmol/L (ref 20–31)
CREATININE: 1.05 mg/dL (ref 0.50–1.05)
Calcium: 9.6 mg/dL (ref 8.6–10.4)
Chloride: 102 mmol/L (ref 98–110)
GFR, EST NON AFRICAN AMERICAN: 60 mL/min (ref >=60)
GFR, Est African American: 69 mL/min (ref >=60)
Glucose, Bld: 84 mg/dL (ref 65–99)
Potassium: 4.2 mmol/L (ref 3.5–5.3)
Sodium: 140 mmol/L (ref 135–146)

## 2015-03-26 LAB — HEPATIC FUNCTION PANEL
ALBUMIN: 4.3 g/dL (ref 3.6–5.1)
ALT: 27 U/L (ref 6–29)
AST: 20 U/L (ref 10–35)
Alkaline Phosphatase: 44 U/L (ref 33–130)
BILIRUBIN INDIRECT: 0.4 mg/dL (ref 0.2–1.2)
Bilirubin, Direct: 0.1 mg/dL (ref <=0.2)
TOTAL PROTEIN: 7.3 g/dL (ref 6.1–8.1)
Total Bilirubin: 0.5 mg/dL (ref 0.2–1.2)

## 2015-03-26 LAB — CBC WITH DIFFERENTIAL/PLATELET
Basophils Absolute: 0 10*3/uL (ref 0.0–0.1)
Basophils Relative: 0 % (ref 0–1)
EOS PCT: 1 % (ref 0–5)
Eosinophils Absolute: 0.1 10*3/uL (ref 0.0–0.7)
HEMATOCRIT: 36.1 % (ref 36.0–46.0)
HEMOGLOBIN: 12.4 g/dL (ref 12.0–15.0)
LYMPHS ABS: 3.5 10*3/uL (ref 0.7–4.0)
LYMPHS PCT: 54 % — AB (ref 12–46)
MCH: 29 pg (ref 26.0–34.0)
MCHC: 34.3 g/dL (ref 30.0–36.0)
MCV: 84.3 fL (ref 78.0–100.0)
MONO ABS: 0.5 10*3/uL (ref 0.1–1.0)
MONOS PCT: 7 % (ref 3–12)
MPV: 9.9 fL (ref 8.6–12.4)
Neutro Abs: 2.5 10*3/uL (ref 1.7–7.7)
Neutrophils Relative %: 38 % — ABNORMAL LOW (ref 43–77)
Platelets: 268 10*3/uL (ref 150–400)
RBC: 4.28 MIL/uL (ref 3.87–5.11)
RDW: 14.3 % (ref 11.5–15.5)
WBC: 6.5 10*3/uL (ref 4.0–10.5)

## 2015-03-26 LAB — LIPID PANEL
CHOLESTEROL: 201 mg/dL — AB (ref 125–200)
HDL: 47 mg/dL (ref >=46)
LDL Cholesterol: 132 mg/dL — ABNORMAL HIGH (ref <130)
Total CHOL/HDL Ratio: 4.3 Ratio (ref <=5.0)
Triglycerides: 108 mg/dL (ref <150)
VLDL: 22 mg/dL (ref <30)

## 2015-03-26 LAB — HEMOGLOBIN A1C
Hgb A1c MFr Bld: 6.1 % — ABNORMAL HIGH (ref <5.7)
MEAN PLASMA GLUCOSE: 128 mg/dL — AB (ref <117)

## 2015-03-26 LAB — MAGNESIUM: Magnesium: 2.1 mg/dL (ref 1.5–2.5)

## 2015-03-26 LAB — VITAMIN D 25 HYDROXY (VIT D DEFICIENCY, FRACTURES): Vit D, 25-Hydroxy: 21 ng/mL — ABNORMAL LOW (ref 30–100)

## 2015-03-26 LAB — INSULIN, FASTING: INSULIN FASTING, SERUM: 6.7 u[IU]/mL (ref 2.0–19.6)

## 2015-03-26 LAB — HIV ANTIBODY (ROUTINE TESTING W REFLEX): HIV: NONREACTIVE

## 2015-03-26 LAB — MICROALBUMIN / CREATININE URINE RATIO
CREATININE, URINE: 131 mg/dL (ref 20–320)
Microalb Creat Ratio: 4 mcg/mg creat (ref <30)
Microalb, Ur: 0.5 mg/dL

## 2015-03-26 LAB — HEPATITIS C ANTIBODY: HCV Ab: NEGATIVE

## 2015-03-26 LAB — TSH: TSH: 4.005 u[IU]/mL (ref 0.350–4.500)

## 2015-04-01 LAB — CYTOLOGY - PAP

## 2015-04-03 ENCOUNTER — Other Ambulatory Visit: Payer: Self-pay | Admitting: Internal Medicine

## 2015-04-21 ENCOUNTER — Encounter: Payer: Self-pay | Admitting: *Deleted

## 2015-06-30 ENCOUNTER — Ambulatory Visit (INDEPENDENT_AMBULATORY_CARE_PROVIDER_SITE_OTHER): Payer: 59 | Admitting: Physician Assistant

## 2015-06-30 ENCOUNTER — Encounter: Payer: Self-pay | Admitting: Physician Assistant

## 2015-06-30 VITALS — BP 120/74 | HR 70 | Temp 97.5°F | Resp 16 | Ht 62.0 in | Wt 168.8 lb

## 2015-06-30 DIAGNOSIS — Z79899 Other long term (current) drug therapy: Secondary | ICD-10-CM | POA: Diagnosis not present

## 2015-06-30 DIAGNOSIS — I10 Essential (primary) hypertension: Secondary | ICD-10-CM

## 2015-06-30 DIAGNOSIS — E559 Vitamin D deficiency, unspecified: Secondary | ICD-10-CM

## 2015-06-30 DIAGNOSIS — E669 Obesity, unspecified: Secondary | ICD-10-CM

## 2015-06-30 DIAGNOSIS — E039 Hypothyroidism, unspecified: Secondary | ICD-10-CM

## 2015-06-30 DIAGNOSIS — R7303 Prediabetes: Secondary | ICD-10-CM | POA: Diagnosis not present

## 2015-06-30 DIAGNOSIS — E785 Hyperlipidemia, unspecified: Secondary | ICD-10-CM

## 2015-06-30 LAB — LIPID PANEL
CHOLESTEROL: 201 mg/dL — AB (ref 125–200)
HDL: 46 mg/dL (ref >=46)
LDL Cholesterol: 110 mg/dL (ref <130)
Total CHOL/HDL Ratio: 4.4 Ratio (ref <=5.0)
Triglycerides: 223 mg/dL — ABNORMAL HIGH (ref <150)
VLDL: 45 mg/dL — AB (ref <30)

## 2015-06-30 LAB — CBC WITH DIFFERENTIAL/PLATELET
BASOS PCT: 0 % (ref 0–1)
Basophils Absolute: 0 10*3/uL (ref 0.0–0.1)
EOS PCT: 1 % (ref 0–5)
Eosinophils Absolute: 0.1 10*3/uL (ref 0.0–0.7)
HCT: 36.3 % (ref 36.0–46.0)
HEMOGLOBIN: 12.3 g/dL (ref 12.0–15.0)
Lymphocytes Relative: 52 % — ABNORMAL HIGH (ref 12–46)
Lymphs Abs: 3.9 10*3/uL (ref 0.7–4.0)
MCH: 28.7 pg (ref 26.0–34.0)
MCHC: 33.9 g/dL (ref 30.0–36.0)
MCV: 84.8 fL (ref 78.0–100.0)
MONO ABS: 0.5 10*3/uL (ref 0.1–1.0)
MONOS PCT: 7 % (ref 3–12)
MPV: 10.5 fL (ref 8.6–12.4)
NEUTROS ABS: 3 10*3/uL (ref 1.7–7.7)
Neutrophils Relative %: 40 % — ABNORMAL LOW (ref 43–77)
Platelets: 266 10*3/uL (ref 150–400)
RBC: 4.28 MIL/uL (ref 3.87–5.11)
RDW: 14.2 % (ref 11.5–15.5)
WBC: 7.5 10*3/uL (ref 4.0–10.5)

## 2015-06-30 LAB — HEPATIC FUNCTION PANEL
ALT: 29 U/L (ref 6–29)
AST: 22 U/L (ref 10–35)
Albumin: 4.7 g/dL (ref 3.6–5.1)
Alkaline Phosphatase: 44 U/L (ref 33–130)
Bilirubin, Direct: 0.1 mg/dL (ref <=0.2)
Indirect Bilirubin: 0.3 mg/dL (ref 0.2–1.2)
TOTAL PROTEIN: 7.4 g/dL (ref 6.1–8.1)
Total Bilirubin: 0.4 mg/dL (ref 0.2–1.2)

## 2015-06-30 LAB — BASIC METABOLIC PANEL WITH GFR
BUN: 9 mg/dL (ref 7–25)
CALCIUM: 9.5 mg/dL (ref 8.6–10.4)
CO2: 27 mmol/L (ref 20–31)
CREATININE: 1.03 mg/dL (ref 0.50–1.05)
Chloride: 102 mmol/L (ref 98–110)
GFR, EST AFRICAN AMERICAN: 70 mL/min (ref >=60)
GFR, Est Non African American: 61 mL/min (ref >=60)
GLUCOSE: 82 mg/dL (ref 65–99)
Potassium: 3.9 mmol/L (ref 3.5–5.3)
Sodium: 139 mmol/L (ref 135–146)

## 2015-06-30 LAB — MAGNESIUM: MAGNESIUM: 2.1 mg/dL (ref 1.5–2.5)

## 2015-06-30 LAB — TSH: TSH: 3.42 m[IU]/L

## 2015-06-30 NOTE — Progress Notes (Signed)
Assessment and Plan:  1. Prediabetes Stop sodas/juice, long discussion very close to DM Discussed general issues about diabetes pathophysiology and management., Educational material distributed., Suggested low cholesterol diet., Encouraged aerobic exercise., Discussed foot care., Reminded to get yearly retinal exam. - CBC with Differential/Platelet - BASIC METABOLIC PANEL WITH GFR - Hepatic function panel - Hemoglobin A1c - Insulin, fasting  2. Hyperlipidemia -continue medications, check lipids, decrease fatty foods, increase activity.  - Lipid panel  3. Obesity Obesity with co morbidities- long discussion about weight loss, diet, and exercise  4. Hypothyroidism, unspecified hypothyroidism type Hypothyroidism-check TSH level, continue medications the same, reminded to take on an empty stomach 30-2560mins before food.  - TSH  5. Medication management - Magnesium  6. Vitamin D deficiency May need prescription if still low due to osteopenia.  - Vit D  25 hydroxy (rtn osteoporosis monitoring)   HPI 55 y.o. female  presents for 3 month follow up   has a past medical history of Hypothyroid; Hypercholesteremia; Hot flashes; Osteoarthritis; and Colon polyp.  Her blood pressure has been controlled at home, today their BP is BP: 120/74 mmHg  She does workout walks at lunch for 30mins. She denies chest pain, shortness of breath, dizziness.  She is on cholesterol medication and denies myalgias. Her cholesterol is at goal. The cholesterol last visit was:   Lab Results  Component Value Date   CHOL 201* 03/25/2015   HDL 47 03/25/2015   LDLCALC 132* 03/25/2015   TRIG 108 03/25/2015   CHOLHDL 4.3 03/25/2015   She has been working on diet and exercise for prediabetes, and denies paresthesia of the feet, polydipsia, polyuria and visual disturbances. She does admit to drinking fruit juice and soda. Last A1C in the office was:  Lab Results  Component Value Date   HGBA1C 6.1* 03/25/2015  Patient  is not on Vitamin D supplement.   Lab Results  Component Value Date   VD25OH 21* 03/25/2015    BMI is Body mass index is 30.87 kg/(m^2)., she is working on diet and exercise. Wt Readings from Last 3 Encounters:  06/30/15 168 lb 12.8 oz (76.567 kg)  03/25/15 168 lb (76.204 kg)  09/29/14 172 lb (78.019 kg)   She is on thyroid medication. Her medication was not changed last visit.  Lab Results  Component Value Date   TSH 4.005 03/25/2015   Continue to have left hip pain, mobic does help some, will take for 2-3 days and then will be okay for a week, xray did show OA.   Current Medications:  Current Outpatient Prescriptions on File Prior to Visit  Medication Sig Dispense Refill  . BIOTIN PO Take by mouth.    . Calcium Carbonate (CALCIUM 600 PO) Take by mouth daily.    . Cholecalciferol (VITAMIN D3) 5000 UNITS TABS Take 5,000 Units by mouth daily.    Marland Kitchen. latanoprost (XALATAN) 0.005 % ophthalmic solution INSTILL 1 DROP IN BOTH EYES AT BEDTIME.  4  . levothyroxine (SYNTHROID, LEVOTHROID) 150 MCG tablet TAKE 1 TABLET (150 MCG TOTAL) BY MOUTH DAILY. 90 tablet 0  . Magnesium 250 MG TABS Take by mouth daily.    . meloxicam (MOBIC) 15 MG tablet Take 1 pill daily with food as needed for pain, can take with tylenol but not with aleve, ibuprofen etc 90 tablet 0  . Multiple Vitamins-Minerals (HAIR/SKIN/NAILS PO) Take by mouth daily.    . simvastatin (ZOCOR) 40 MG tablet TAKE 1 TABLET (40 MG TOTAL) BY MOUTH DAILY. 90 tablet 1  .  vitamin E (VITAMIN E) 400 UNIT capsule Take 400 Units by mouth daily.    . Zinc 50 MG TABS Take by mouth daily.     No current facility-administered medications on file prior to visit.   Medical History:  Past Medical History  Diagnosis Date  . Hypothyroid   . Hypercholesteremia   . Hot flashes   . Osteoarthritis   . Colon polyp    Allergies:  Allergies  Allergen Reactions  . Cephalexin     Throat swelling     Review of Systems:  Review of Systems   Constitutional: Positive for malaise/fatigue. Negative for fever, chills, weight loss and diaphoresis.  HENT: Negative.   Respiratory: Negative.   Cardiovascular: Negative.   Gastrointestinal: Negative.   Genitourinary: Negative.  Negative for dysuria and frequency.  Musculoskeletal: Positive for back pain and joint pain (left hip). Negative for myalgias, falls and neck pain.  Skin: Negative.   Neurological: Negative.  Negative for weakness.  Psychiatric/Behavioral: Negative.     Family history- Review and unchanged Social history- Review and unchanged Physical Exam: BP 120/74 mmHg  Pulse 70  Temp(Src) 97.5 F (36.4 C) (Temporal)  Resp 16  Ht  (1.575 m)  Wt 168 lb 12.8 oz (76.567 kg)  BMI 30.87 kg/m2  SpO2 96% Wt Readings from Last 3 Encounters:  06/30/15 168 lb 12.8 oz (76.567 kg)  03/25/15 168 lb (76.204 kg)  09/29/14 172 lb (78.019 kg)   General Appearance: Well nourished, in no apparent distress. Eyes: PERRLA, EOMs, conjunctiva no swelling or erythema Sinuses: No Frontal/maxillary tenderness ENT/Mouth: Ext aud canals clear, TMs without erythema, bulging. No erythema, swelling, or exudate on post pharynx.  Tonsils not swollen or erythematous. Hearing normal.  Neck: Supple, thyroid normal.  Respiratory: Respiratory effort normal, BS equal bilaterally without rales, rhonchi, wheezing or stridor.  Cardio: RRR with no MRGs. Brisk peripheral pulses without edema.  Abdomen: Soft, + BS,  Non tender, no guarding, rebound, hernias, masses. Lymphatics: Non tender without lymphadenopathy.  Musculoskeletal: Full ROM, 5/5 strength,  Patient is able to ambulate well.  Gait is not antalgic.Left hip: positives: pain with external rotation of hip, pain with abduction otherwise full ROM. and negatives: no tenderness, no pain with heel impact, pulses full without SI pain.Good distal sensations and pulses bilaterally. Skin: Warm, dry without rashes, lesions, ecchymosis.  Neuro: Cranial  nerves intact. Normal muscle tone, no cerebellar symptoms. Psych: Awake and oriented X 3, normal affect, Insight and Judgment appropriate.    Quentin Mulling, PA-C 3:39 PM [facility]

## 2015-06-30 NOTE — Patient Instructions (Signed)
Urinary Incontinence Urinary incontinence is the involuntary loss of urine from your bladder. CAUSES  There are many causes of urinary incontinence. They include:  Medicines.  Infections.  Prostatic enlargement, leading to overflow of urine from your bladder.  Surgery.  Neurological diseases.  Emotional factors. SIGNS AND SYMPTOMS Urinary Incontinence can be divided into four types: 1. Urge incontinence. Urge incontinence is the involuntary loss of urine before you have the opportunity to go to the bathroom. There is a sudden urge to void but not enough time to reach a bathroom. 2. Stress incontinence. Stress incontinence is the sudden loss of urine with any activity that forces urine to pass. It is commonly caused by anatomical changes to the pelvis and sphincter areas of your body. 3. Overflow incontinence. Overflow incontinence is the loss of urine from an obstructed opening to your bladder. This results in a backup of urine and a resultant buildup of pressure within the bladder. When the pressure within the bladder exceeds the closing pressure of the sphincter, the urine overflows, which causes incontinence, similar to water overflowing a dam. 4. Total incontinence. Total incontinence is the loss of urine as a result of the inability to store urine within your bladder. DIAGNOSIS  Evaluating the cause of incontinence may require:  A thorough and complete medical and obstetric history.  A complete physical exam.  Laboratory tests such as a urine culture and sensitivities. When additional tests are indicated, they can include:  An ultrasound exam.  Kidney and bladder X-rays.  Cystoscopy. This is an exam of the bladder using a narrow scope.  Urodynamic testing to test the nerve function to the bladder and sphincter areas. TREATMENT  Treatment for urinary incontinence depends on the cause:  For urge incontinence caused by a bacterial infection, antibiotics will be prescribed.  If the urge incontinence is related to medicines you take, your health care provider may have you change the medicine.  For stress incontinence, surgery to re-establish anatomical support to the bladder or sphincter, or both, will often correct the condition.  For overflow incontinence caused by an enlarged prostate, an operation to open the channel through the enlarged prostate will allow the flow of urine out of the bladder. In women with fibroids, a hysterectomy may be recommended.  For total incontinence, surgery on your urinary sphincter may help. An artificial urinary sphincter (an inflatable cuff placed around the urethra) may be required. In women who have developed a hole-like passage between their bladder and vagina (vesicovaginal fistula), surgery to close the fistula often is required. HOME CARE INSTRUCTIONS  Normal daily hygiene and the use of pads or adult diapers that are changed regularly will help prevent odors and skin damage.  Avoid caffeine. It can overstimulate your bladder.  Use the bathroom regularly. Try about every 2-3 hours to go to the bathroom, even if you do not feel the need to do so. Take time to empty your bladder completely. After urinating, wait a minute. Then try to urinate again.  For causes involving nerve dysfunction, keep a log of the medicines you take and a journal of the times you go to the bathroom. SEEK MEDICAL CARE IF:  You experience worsening of pain instead of improvement in pain after your procedure.  Your incontinence becomes worse instead of better. SEE IMMEDIATE MEDICAL CARE IF:  You experience fever or shaking chills.  You are unable to pass your urine.  You have redness spreading into your groin or down into your thighs. MAKE SURE   YOU:   Understand these instructions.   Will watch your condition.  Will get help right away if you are not doing well or get worse.   This information is not intended to replace advice given to you  by your health care provider. Make sure you discuss any questions you have with your health care provider.   Document Released: 05/05/2004 Document Revised: 04/18/2014 Document Reviewed: 09/04/2012 Elsevier Interactive Patient Education 2016 [employer]  

## 2015-07-01 LAB — HEMOGLOBIN A1C
Hgb A1c MFr Bld: 6.1 % — ABNORMAL HIGH (ref <5.7)
Mean Plasma Glucose: 128 mg/dL — ABNORMAL HIGH (ref <117)

## 2015-07-01 LAB — VITAMIN D 25 HYDROXY (VIT D DEFICIENCY, FRACTURES): VIT D 25 HYDROXY: 29 ng/mL — AB (ref 30–100)

## 2015-07-18 ENCOUNTER — Other Ambulatory Visit: Payer: Self-pay | Admitting: Internal Medicine

## 2015-09-15 ENCOUNTER — Other Ambulatory Visit: Payer: Self-pay | Admitting: Internal Medicine

## 2015-09-15 ENCOUNTER — Other Ambulatory Visit: Payer: Self-pay | Admitting: Physician Assistant

## 2015-10-05 ENCOUNTER — Encounter: Payer: Self-pay | Admitting: Physician Assistant

## 2015-10-05 ENCOUNTER — Ambulatory Visit (INDEPENDENT_AMBULATORY_CARE_PROVIDER_SITE_OTHER): Payer: 59 | Admitting: Physician Assistant

## 2015-10-05 VITALS — BP 124/72 | HR 74 | Temp 98.2°F | Resp 16 | Ht 62.0 in | Wt 170.0 lb

## 2015-10-05 DIAGNOSIS — R079 Chest pain, unspecified: Secondary | ICD-10-CM | POA: Diagnosis not present

## 2015-10-05 DIAGNOSIS — E559 Vitamin D deficiency, unspecified: Secondary | ICD-10-CM | POA: Diagnosis not present

## 2015-10-05 DIAGNOSIS — E785 Hyperlipidemia, unspecified: Secondary | ICD-10-CM | POA: Diagnosis not present

## 2015-10-05 DIAGNOSIS — Z79899 Other long term (current) drug therapy: Secondary | ICD-10-CM

## 2015-10-05 DIAGNOSIS — E669 Obesity, unspecified: Secondary | ICD-10-CM

## 2015-10-05 DIAGNOSIS — E039 Hypothyroidism, unspecified: Secondary | ICD-10-CM

## 2015-10-05 DIAGNOSIS — R7303 Prediabetes: Secondary | ICD-10-CM

## 2015-10-05 DIAGNOSIS — I10 Essential (primary) hypertension: Secondary | ICD-10-CM

## 2015-10-05 DIAGNOSIS — R413 Other amnesia: Secondary | ICD-10-CM

## 2015-10-05 LAB — CBC WITH DIFFERENTIAL/PLATELET
Basophils Absolute: 0 cells/uL (ref 0–200)
Basophils Relative: 0 %
EOS PCT: 1 %
Eosinophils Absolute: 77 cells/uL (ref 15–500)
HCT: 36.3 % (ref 35.0–45.0)
HEMOGLOBIN: 12.1 g/dL (ref 11.7–15.5)
LYMPHS ABS: 4004 {cells}/uL — AB (ref 850–3900)
Lymphocytes Relative: 52 %
MCH: 29 pg (ref 27.0–33.0)
MCHC: 33.3 g/dL (ref 32.0–36.0)
MCV: 87.1 fL (ref 80.0–100.0)
MPV: 10.3 fL (ref 7.5–12.5)
Monocytes Absolute: 462 cells/uL (ref 200–950)
Monocytes Relative: 6 %
Neutro Abs: 3157 cells/uL (ref 1500–7800)
Neutrophils Relative %: 41 %
Platelets: 273 10*3/uL (ref 140–400)
RBC: 4.17 MIL/uL (ref 3.80–5.10)
RDW: 14.4 % (ref 11.0–15.0)
WBC: 7.7 10*3/uL (ref 3.8–10.8)

## 2015-10-05 MED ORDER — RANITIDINE HCL 300 MG PO TABS: 300.0000 mg | ORAL_TABLET | Freq: Two times a day (BID) | ORAL | Status: DC

## 2015-10-05 NOTE — Progress Notes (Signed)
Assessment and Plan:  1. Prediabetes Stop sodas/juice, long discussion very close to DM Discussed general issues about diabetes pathophysiology and management., Educational material distributed., Suggested low cholesterol diet., Encouraged aerobic exercise., Discussed foot care., Reminded to get yearly retinal exam. - CBC with Differential/Platelet - BASIC METABOLIC PANEL WITH GFR - Hepatic function panel - Hemoglobin A1c - Insulin, fasting  2. Hyperlipidemia -continue medications, check lipids, decrease fatty foods, increase activity.  - Lipid panel  3. Obesity Obesity with co morbidities- long discussion about weight loss, diet, and exercise  4. Hypothyroidism, unspecified hypothyroidism type Hypothyroidism-check TSH level, continue medications the same, reminded to take on an empty stomach 30-7460mins before food.  - TSH  5. Medication management - Magnesium  6. Vitamin D deficiency May need prescription if still low due to osteopenia.  - Vit D  25 hydroxy (rtn osteoporosis monitoring)  7. Memory issues - abnormal collection/nonspecific symptoms Will check labs, normal neuro, possible from abnormal TSH, will check Follow up eye doctor  8. Left chest pain Atypical, nonexertional, + epigastric pain, likely more gastritis from mobic, will treat with zantac, get CXR and check labs.   The patient was advised to call immediately if she has any concerning symptoms in the interval. The patient voices understanding of current treatment options and is in agreement with the current care plan. All questions were answered. The patient knows to call the clinic with any problems, questions or concerns or go to the ER if any further progression of symptoms.   HPI 55 y.o. AA female  presents for 3 month follow up   has a past medical history of Hypothyroid; Hypercholesteremia; Hot flashes; Osteoarthritis; and Colon polyp.  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 Has not been under any stress from family/friends. Had 1 day of dizziness and states now having to wear readers more.  Will have sharp pain very specific left sided chest/AB pain, will last mins to hours, better with sitting up, occ dizziness with it, no nausea, SOB, nonexertional.    Her blood pressure has been controlled at home, today their BP is BP: 124/72 mmHg  She does workout walks at lunch for 30mins. She denies chest pain, shortness of breath, dizziness.  She is on cholesterol medication and denies myalgias. Her cholesterol is at goal. The cholesterol last visit was:   Lab Results  Component Value Date   CHOL 201* 06/30/2015   HDL 46 06/30/2015   LDLCALC 110 06/30/2015   TRIG 223* 06/30/2015   CHOLHDL 4.4 06/30/2015   She has been working on diet and exercise for prediabetes, and denies paresthesia of the feet, polydipsia, polyuria and visual disturbances. She does admit to drinking fruit juice and soda. Last A1C in the office was:  Lab Results  Component Value Date   HGBA1C 6.1* 06/30/2015  Patient is not on Vitamin D supplement.   Lab Results  Component Value Date   VD25OH 29* 06/30/2015    BMI is Body mass index is 31.09 kg/(m^2)., she is working on diet and exercise. Wt Readings from Last 3 Encounters:  10/05/15 170 lb (77.111 kg)  06/30/15 168 lb 12.8 oz (76.567 kg)  03/25/15 168 lb (76.204 kg)   She is on thyroid medication.  Her medication was not changed last visit.  Lab Results  Component Value Date   TSH 3.42 06/30/2015     Current Medications:  Current Outpatient Prescriptions on File Prior to Visit  Medication Sig Dispense Refill  . Cholecalciferol (VITAMIN D3) 5000 UNITS TABS Take 5,000 Units by mouth daily.    Marland Kitchen. levothyroxine (SYNTHROID, LEVOTHROID) 150 MCG tablet TAKE 1  TABLET (150 MCG TOTAL) BY MOUTH DAILY. 90 tablet 1  . meloxicam (MOBIC) 15 MG tablet TAKE 1 TABLET BY MOUTH EVERY DAY AS NEEDED FOR PAIN WITH FOOD. NO OTHER NSAIDS WITH THIS MEDICATION 90 tablet 1  . Multiple Vitamins-Minerals (HAIR/SKIN/NAILS PO) Take by mouth daily.    . simvastatin (ZOCOR) 40 MG tablet TAKE 1 TABLET (40 MG TOTAL) BY MOUTH DAILY. 90 tablet 1   No current facility-administered medications on file prior to visit.   Medical History:  Past Medical History  Diagnosis Date  . Hypothyroid   . Hypercholesteremia   . Hot flashes   . Osteoarthritis   . Colon polyp    Allergies:  Allergies  Allergen Reactions  . Cephalexin     Throat swelling     Review of Systems:  Review of Systems  Constitutional: Positive for malaise/fatigue. Negative for fever, chills, weight loss and diaphoresis.  HENT: Negative for congestion, ear discharge, ear pain, hearing loss, nosebleeds, sore throat and tinnitus.   Respiratory: Negative.  Negative for stridor.   Cardiovascular: Positive for chest pain. Negative for palpitations, orthopnea, claudication, leg swelling and PND.  Gastrointestinal: Negative.   Genitourinary: Negative.  Negative for dysuria and frequency.  Musculoskeletal: Negative for myalgias, back pain, joint pain, falls and neck pain.  Skin: Negative.   Neurological: Positive for dizziness and headaches. Negative for tingling, tremors, sensory change, speech change, focal weakness, seizures, loss of consciousness and weakness.  Psychiatric/Behavioral: Positive for memory loss. Negative for depression, suicidal ideas, hallucinations and substance abuse. The patient is not nervous/anxious and does not have insomnia.     Family history- Review and unchanged Social history- Review and unchanged Physical Exam: BP 124/72 mmHg  Pulse 74  Temp(Src) 98.2 F (36.8 C) (Temporal)  Resp 16  Ht 5\' 2"  (1.575 m)  Wt 170 lb (77.111 kg)  BMI 31.09 kg/m2 Wt Readings from Last 3  Encounters:  10/05/15 170 lb (77.111 kg)  06/30/15 168 lb 12.8 oz (76.567 kg)  03/25/15 168 lb (76.204 kg)   General Appearance: Well nourished, in no apparent distress. Eyes: PERRLA, EOMs, conjunctiva no swelling or erythema Sinuses: No Frontal/maxillary tenderness ENT/Mouth: Ext aud canals clear, TMs without erythema, bulging. No erythema, swelling, or exudate on post pharynx.  Tonsils not swollen or erythematous. Hearing normal. + TMJ tenderness, no abscess appreciated.  Neck: Supple, thyroid normal.  Respiratory: Respiratory effort normal, BS equal bilaterally without rales, rhonchi, wheezing or stridor.  Cardio: RRR with no MRGs. Brisk peripheral pulses without edema.  Abdomen: Soft, + BS,  + epigastric tender, no guarding, rebound, hernias, masses. Lymphatics: Non tender without lymphadenopathy.  Musculoskeletal: Full ROM, 5/5 strength,   Skin: Warm, dry without rashes, lesions, ecchymosis.  Neuro: Cranial nerves intact. Normal muscle tone, no cerebellar symptoms. Psych: Awake and oriented X 3, normal affect, Insight and Judgment appropriate.    Quentin MullingAmanda Kinya Meine, PA-C 3:57 PM [facility]

## 2015-10-05 NOTE — Patient Instructions (Addendum)
Take omeprazole over the counter for 2 weeks, then go to zantac 150-300 mg at night for 2 weeks, then you can stop.  Avoid alcohol, spicy foods, NSAIDS (aleve, ibuprofen) at this time. See foods below.   Food Choices for Gastroesophageal Reflux Disease When you have gastroesophageal reflux disease (GERD), the foods you eat and your eating habits are very important. Choosing the right foods can help ease the discomfort of GERD. WHAT GENERAL GUIDELINES DO I NEED TO FOLLOW?  Choose fruits, vegetables, whole grains, low-fat dairy products, and low-fat meat, fish, and poultry.  Limit fats such as oils, salad dressings, butter, nuts, and avocado.  Keep a food diary to identify foods that cause symptoms.  Avoid foods that cause reflux. These may be different for different people.  Eat frequent small meals instead of three large meals each day.  Eat your meals slowly, in a relaxed setting.  Limit fried foods.  Cook foods using methods other than frying.  Avoid drinking alcohol.  Avoid drinking large amounts of liquids with your meals.  Avoid bending over or lying down until 2-3 hours after eating. WHAT FOODS ARE NOT RECOMMENDED? The following are some foods and drinks that may worsen your symptoms: Vegetables Tomatoes. Tomato juice. Tomato and spaghetti sauce. Chili peppers. Onion and garlic. Horseradish. Fruits Oranges, grapefruit, and lemon (fruit and juice). Meats High-fat meats, fish, and poultry. This includes hot dogs, ribs, ham, sausage, salami, and bacon. Dairy Whole milk and chocolate milk. Sour cream. Cream. Butter. Ice cream. Cream cheese.  Beverages Coffee and tea, with or without caffeine. Carbonated beverages or energy drinks. Condiments Hot sauce. Barbecue sauce.  Sweets/Desserts Chocolate and cocoa. Donuts. Peppermint and spearmint. Fats and Oils High-fat foods, including [place] fries and potato chips. Other Vinegar. Strong spices, such as black pepper, white  pepper, red pepper, cayenne, curry powder, cloves, ginger, and chili powder.   What is the TMJ? The temporomandibular (tem-PUH-ro-man-DIB-yoo-ler) joint, or the TMJ, connects the upper and lower jawbones. This joint allows the jaw to open wide and move back and forth when you chew, talk, or yawn.There are also several muscles that help this joint move. There can be muscle tightness and pain in the muscle that can cause several symptoms.  What causes TMJ pain? There are many causes of TMJ pain. Repeated chewing (for example, chewing gum) and clenching your teeth can cause pain in the joint. Some TMJ pain has no obvious cause. What can I do to ease the pain? There are many things you can do to help your pain get better. When you have pain:  Eat soft foods and stay away from chewy foods (for example, taffy) Try to use both sides of your mouth to chew Don't chew gum Massage Don't open your mouth wide (for example, during yawning or singing) Don't bite your cheeks or fingernails Lower your amount of stress and worry Applying a warm, damp washcloth to the joint may help. Over-the-counter pain medicines such as ibuprofen (one brand: Advil) or acetaminophen (one brand: Tylenol) might also help. Do not use these medicines if you are allergic to them or if your doctor told you not to use them. How can I stop the pain from coming back? When your pain is better, you can do these exercises to make your muscles stronger and to keep the pain from coming back:  Resisted mouth opening: Place your thumb or two fingers under your chin and open your mouth slowly, pushing up lightly on your chin with your  thumb. Hold for three to six seconds. Close your mouth slowly. Resisted mouth closing: Place your thumbs under your chin and your two index fingers on the ridge between your mouth and the bottom of your chin. Push down lightly on your chin as you close your mouth. Tongue up: Slowly open and close your mouth while  keeping the tongue touching the roof of the mouth. Side-to-side jaw movement: Place an object about one fourth of an inch thick (for example, two tongue depressors) between your front teeth. Slowly move your jaw from side to side. Increase the thickness of the object as the exercise becomes easier Forward jaw movement: Place an object about one fourth of an inch thick between your front teeth and move the bottom jaw forward so that the bottom teeth are in front of the top teeth. Increase the thickness of the object as the exercise becomes easier. These exercises should not be painful. If it hurts to do these exercises, stop doing them and talk to your family doctor.   Management of Memory Problems  There are some general things you can do to help manage your memory problems.  Your memory may not in fact recover, but by using techniques and strategies you will be able to manage your memory difficulties better.  1)  Establish a routine.  Try to establish and then stick to a regular routine.  By doing this, you will get used to what to expect and you will reduce the need to rely on your memory.  Also, try to do things at the same time of day, such as taking your medication or checking your calendar first thing in the morning.  Think about think that you can do as a part of a regular routine and make a list.  Then enter them into a daily planner to remind you.  This will help you establish a routine.  2)  Organize your environment.  Organize your environment so that it is uncluttered.  Decrease visual stimulation.  Place everyday items such as keys or cell phone in the same place every day (ie.  Basket next to front door)  Use post it notes with a brief message to yourself (ie. Turn off light, lock the door)  Use labels to indicate where things go (ie. Which cupboards are for food, dishes, etc.)  Keep a notepad and pen by the telephone to take messages  3)  Memory Aids  A diary or  journal/notebook/daily planner  Making a list (shopping list, chore list, to do list that needs to be done)  Using an alarm as a reminder (kitchen timer or cell phone alarm)  Using cell phone to store information (Notes, Calendar, Reminders)  Calendar/White board placed in a prominent position  Post-it notes  In order for memory aids to be useful, you need to have good habits.  It's no good remembering to make a note in your journal if you don't remember to look in it.  Try setting aside a certain time of day to look in journal.  4)  Improving mood and managing fatigue.  There may be other factors that contribute to memory difficulties.  Factors, such as anxiety, depression and tiredness can affect memory.  Regular gentle exercise can help improve your mood and give you more energy.  Simple relaxation techniques may help relieve symptoms of anxiety  Try to get back to completing activities or hobbies you enjoyed doing in the past.  Learn to pace yourself through activities  to decrease fatigue.  Find out about some local support groups where you can share experiences with others.  Try and achieve 7-8 hours of sleep at night.

## 2015-10-06 LAB — LIPID PANEL
CHOL/HDL RATIO: 4.1 ratio (ref <=5.0)
CHOLESTEROL: 182 mg/dL (ref 125–200)
HDL: 44 mg/dL — ABNORMAL LOW (ref >=46)
LDL Cholesterol: 97 mg/dL (ref <130)
TRIGLYCERIDES: 203 mg/dL — AB (ref <150)
VLDL: 41 mg/dL — ABNORMAL HIGH (ref <30)

## 2015-10-06 LAB — VITAMIN D 25 HYDROXY (VIT D DEFICIENCY, FRACTURES): Vit D, 25-Hydroxy: 48 ng/mL (ref 30–100)

## 2015-10-06 LAB — BASIC METABOLIC PANEL WITH GFR
BUN: 12 mg/dL (ref 7–25)
CALCIUM: 10 mg/dL (ref 8.6–10.4)
CO2: 29 mmol/L (ref 20–31)
Chloride: 107 mmol/L (ref 98–110)
Creat: 1.09 mg/dL — ABNORMAL HIGH (ref 0.50–1.05)
GFR, EST AFRICAN AMERICAN: 66 mL/min (ref >=60)
GFR, EST NON AFRICAN AMERICAN: 57 mL/min — AB (ref >=60)
Glucose, Bld: 88 mg/dL (ref 65–99)
Potassium: 4.7 mmol/L (ref 3.5–5.3)
SODIUM: 144 mmol/L (ref 135–146)

## 2015-10-06 LAB — IRON AND TIBC
%SAT: 31 % (ref 11–50)
Iron: 87 ug/dL (ref 45–160)
TIBC: 282 ug/dL (ref 250–450)
UIBC: 195 ug/dL (ref 125–400)

## 2015-10-06 LAB — FERRITIN: FERRITIN: 136 ng/mL (ref 10–232)

## 2015-10-06 LAB — SEDIMENTATION RATE: SED RATE: 1 mm/h (ref 0–30)

## 2015-10-06 LAB — HEPATIC FUNCTION PANEL
ALT: 22 U/L (ref 6–29)
AST: 22 U/L (ref 10–35)
Albumin: 4.5 g/dL (ref 3.6–5.1)
Alkaline Phosphatase: 39 U/L (ref 33–130)
BILIRUBIN DIRECT: 0.1 mg/dL (ref <=0.2)
BILIRUBIN INDIRECT: 0.3 mg/dL (ref 0.2–1.2)
Total Bilirubin: 0.4 mg/dL (ref 0.2–1.2)
Total Protein: 7.2 g/dL (ref 6.1–8.1)

## 2015-10-06 LAB — MAGNESIUM: Magnesium: 2.2 mg/dL (ref 1.5–2.5)

## 2015-10-06 LAB — HEMOGLOBIN A1C
Hgb A1c MFr Bld: 6.1 % — ABNORMAL HIGH (ref <5.7)
Mean Plasma Glucose: 128 mg/dL

## 2015-10-06 LAB — ANTI-DNA ANTIBODY, DOUBLE-STRANDED: ds DNA Ab: 1 IU/mL

## 2015-10-06 LAB — ANTI-NUCLEAR AB-TITER (ANA TITER): ANA Titer 1: 1:320 {titer} — ABNORMAL HIGH

## 2015-10-06 LAB — ANA: Anti Nuclear Antibody(ANA): POSITIVE — AB

## 2015-10-06 LAB — RPR

## 2015-10-06 LAB — HIV ANTIBODY (ROUTINE TESTING W REFLEX): HIV 1&2 Ab, 4th Generation: NONREACTIVE

## 2015-10-06 LAB — TSH: TSH: 4.81 mIU/L — ABNORMAL HIGH

## 2015-10-06 LAB — VITAMIN B12: Vitamin B-12: 1136 pg/mL — ABNORMAL HIGH (ref 200–1100)

## 2015-10-27 ENCOUNTER — Other Ambulatory Visit: Payer: Self-pay | Admitting: Physician Assistant

## 2015-10-27 DIAGNOSIS — Z1231 Encounter for screening mammogram for malignant neoplasm of breast: Secondary | ICD-10-CM

## 2015-11-02 ENCOUNTER — Ambulatory Visit (INDEPENDENT_AMBULATORY_CARE_PROVIDER_SITE_OTHER): Payer: 59 | Admitting: Physician Assistant

## 2015-11-02 ENCOUNTER — Encounter: Payer: Self-pay | Admitting: Physician Assistant

## 2015-11-02 VITALS — BP 118/72 | HR 73 | Temp 97.3°F | Resp 14 | Ht 62.0 in | Wt 170.4 lb

## 2015-11-02 DIAGNOSIS — R413 Other amnesia: Secondary | ICD-10-CM

## 2015-11-02 DIAGNOSIS — E785 Hyperlipidemia, unspecified: Secondary | ICD-10-CM | POA: Diagnosis not present

## 2015-11-02 DIAGNOSIS — E039 Hypothyroidism, unspecified: Secondary | ICD-10-CM | POA: Diagnosis not present

## 2015-11-02 MED ORDER — SIMVASTATIN 40 MG PO TABS: ORAL_TABLET | ORAL | 1 refills | Status: DC

## 2015-11-02 NOTE — Patient Instructions (Signed)
Add ENTERIC COATED low dose 81 mg Aspirin daily OR can do every other day if you have easy bruising to protect your heart and head. As well as to reduce risk of Colon Cancer by 20 %, Skin Cancer by 26 % , Melanoma by 46% and Pancreatic cancer by 60%   

## 2015-11-02 NOTE — Progress Notes (Signed)
Assessment and Plan: Gastritis- improved off mobic, continue zantac Hypothyroidism- check lab, continue empty stomach Memory issues- normal neuro- negative labs other than TSH, states at this time she does not want  referral to neuro or any imaging. Has follow up Dec. Add on low dose aspirin.  All questions were answered. The patient knows to call the clinic with any problems, questions or concerns or go to the ER if any further progression of symptoms.    Future Appointments Date Time Provider Department Center  11/10/2015 7:20 AM [facility]  03/28/2016 2:00 PM Quentin Mulling, PA-C [facility] None     HPI 55 y.o.female presents for 1 month follow up. Last visit she was complaining of nonspecifc memory issues and atypical left sided chest pain. She was referred to her eye doctor and has appointment in August, and the atypical chest pain was thought to be from mobic and gastritis, she was treated with zantac, and she states she has not had any more chest pain/AB pain. She is here for follow up. She had + ANA but negative antiDNA, neg RPR, HIV, ferritin, sed rate, B12. Her thyroid was adjusted, she is on 1 pill Monday-Friday (was taking only M,W,F), she states that her memory is still poor, has had some dizziness, one episode was while she was outside at a park related to the heat and another episode was while sitting and watching TV, describes a vertigo symptoms. .   Past Medical History:  Diagnosis Date  . Colon polyp   . Hot flashes   . Hypercholesteremia   . Hypothyroid   . Osteoarthritis      Allergies  Allergen Reactions  . Cephalexin     Throat swelling      Current Outpatient Prescriptions on File Prior to Visit  Medication Sig Dispense Refill  . Cholecalciferol (VITAMIN D3) 5000 UNITS TABS Take 5,000 Units by mouth daily.    Marland Kitchen levothyroxine (SYNTHROID, LEVOTHROID) 150 MCG tablet TAKE 1 TABLET (150 MCG TOTAL) BY MOUTH DAILY. 90 tablet 1  . meloxicam  (MOBIC) 15 MG tablet TAKE 1 TABLET BY MOUTH EVERY DAY AS NEEDED FOR PAIN WITH FOOD. NO OTHER NSAIDS WITH THIS MEDICATION 90 tablet 1  . Multiple Vitamins-Minerals (HAIR/SKIN/NAILS PO) Take by mouth daily.    . Pyridoxine HCl (VITAMIN B6) 200 MG TABS Take by mouth daily.    . ranitidine (ZANTAC) 300 MG tablet Take 1 tablet (300 mg total) by mouth 2 (two) times daily. 60 tablet 1  . simvastatin (ZOCOR) 40 MG tablet TAKE 1 TABLET (40 MG TOTAL) BY MOUTH DAILY. 90 tablet 1  . vitamin B-12 (CYANOCOBALAMIN) 100 MCG tablet Take 100 mcg by mouth daily.     No current facility-administered medications on file prior to visit.     ROS: all negative except above.   Physical Exam: Filed Weights   11/02/15 1612  Weight: 170 lb 6.4 oz (77.3 kg)   BP 118/72   Pulse 73   Temp 97.3 F (36.3 C) (Temporal)   Resp 14   Ht 5\' 2"  (1.575 m)   Wt 170 lb 6.4 oz (77.3 kg)   SpO2 95%   BMI 31.17 kg/m  General Appearance: Well nourished, in no apparent distress. Eyes: PERRLA, EOMs, conjunctiva no swelling or erythema Sinuses: No Frontal/maxillary tenderness ENT/Mouth: Ext aud canals clear, TMs without erythema, bulging. No erythema, swelling, or exudate on post pharynx.  Tonsils not swollen or erythematous. Hearing normal.  Neck: Supple, thyroid normal.  Respiratory: Respiratory effort normal, BS equal bilaterally without rales, rhonchi, wheezing or stridor.  Cardio: RRR with no MRGs. Brisk peripheral pulses without edema.  Abdomen: Soft, + BS.  Non tender, no guarding, rebound, hernias, masses. Lymphatics: Non tender without lymphadenopathy.  Musculoskeletal: Full ROM, 5/5 strength, normal gait.  Skin: Warm, dry without rashes, lesions, ecchymosis.  Neuro: Cranial nerves intact. Normal muscle tone, no cerebellar symptoms. Sensation intact.  Psych: Awake and oriented X 3, normal affect, Insight and Judgment appropriate.     Quentin Mulling, PA-C 4:16 PM [facility]

## 2015-11-03 LAB — TSH: TSH: 0.33 mIU/L — ABNORMAL LOW

## 2015-11-10 ENCOUNTER — Ambulatory Visit
Admission: RE | Admit: 2015-11-10 | Discharge: 2015-11-10 | Disposition: A | Discharge status: Home / Self Care | Payer: 59 | Source: Ambulatory Visit | Attending: Physician Assistant | Admitting: Physician Assistant

## 2015-11-10 DIAGNOSIS — Z1231 Encounter for screening mammogram for malignant neoplasm of breast: Secondary | ICD-10-CM

## 2016-03-28 ENCOUNTER — Encounter: Payer: Self-pay | Admitting: Physician Assistant

## 2016-03-28 ENCOUNTER — Ambulatory Visit (INDEPENDENT_AMBULATORY_CARE_PROVIDER_SITE_OTHER): Payer: 59 | Admitting: Physician Assistant

## 2016-03-28 VITALS — BP 126/74 | HR 73 | Temp 97.3°F | Resp 16 | Ht 64.0 in | Wt 177.8 lb

## 2016-03-28 DIAGNOSIS — Z79899 Other long term (current) drug therapy: Secondary | ICD-10-CM

## 2016-03-28 DIAGNOSIS — R930 Abnormal findings on diagnostic imaging of skull and head, not elsewhere classified: Secondary | ICD-10-CM

## 2016-03-28 DIAGNOSIS — M8580 Other specified disorders of bone density and structure, unspecified site: Secondary | ICD-10-CM | POA: Diagnosis not present

## 2016-03-28 DIAGNOSIS — M1990 Unspecified osteoarthritis, unspecified site: Secondary | ICD-10-CM | POA: Diagnosis not present

## 2016-03-28 DIAGNOSIS — E559 Vitamin D deficiency, unspecified: Secondary | ICD-10-CM | POA: Diagnosis not present

## 2016-03-28 DIAGNOSIS — I10 Essential (primary) hypertension: Secondary | ICD-10-CM

## 2016-03-28 DIAGNOSIS — R6889 Other general symptoms and signs: Secondary | ICD-10-CM

## 2016-03-28 DIAGNOSIS — E785 Hyperlipidemia, unspecified: Secondary | ICD-10-CM

## 2016-03-28 DIAGNOSIS — K635 Polyp of colon: Secondary | ICD-10-CM | POA: Diagnosis not present

## 2016-03-28 DIAGNOSIS — E039 Hypothyroidism, unspecified: Secondary | ICD-10-CM

## 2016-03-28 DIAGNOSIS — R7303 Prediabetes: Secondary | ICD-10-CM

## 2016-03-28 DIAGNOSIS — Z0001 Encounter for general adult medical examination with abnormal findings: Secondary | ICD-10-CM | POA: Diagnosis not present

## 2016-03-28 LAB — CBC WITH DIFFERENTIAL/PLATELET
Basophils Absolute: 0 cells/uL (ref 0–200)
Basophils Relative: 0 %
EOS PCT: 1 %
Eosinophils Absolute: 82 cells/uL (ref 15–500)
HCT: 36.2 % (ref 35.0–45.0)
HEMOGLOBIN: 12.2 g/dL (ref 11.7–15.5)
LYMPHS ABS: 4510 {cells}/uL — AB (ref 850–3900)
Lymphocytes Relative: 55 %
MCH: 29.3 pg (ref 27.0–33.0)
MCHC: 33.7 g/dL (ref 32.0–36.0)
MCV: 86.8 fL (ref 80.0–100.0)
MONOS PCT: 8 %
MPV: 10 fL (ref 7.5–12.5)
Monocytes Absolute: 656 cells/uL (ref 200–950)
NEUTROS ABS: 2952 {cells}/uL (ref 1500–7800)
Neutrophils Relative %: 36 %
PLATELETS: 293 10*3/uL (ref 140–400)
RBC: 4.17 MIL/uL (ref 3.80–5.10)
RDW: 14.5 % (ref 11.0–15.0)
WBC: 8.2 10*3/uL (ref 3.8–10.8)

## 2016-03-28 NOTE — Progress Notes (Signed)
Complete Physical  Assessment and Plan: Hypothyroidism, unspecified hypothyroidism type Hypothyroidism-check TSH level, continue medications the same, reminded to take on an empty stomach 30-4260mins before food.  - TSH  Hyperlipidemia -continue medications, check lipids, decrease fatty foods, increase activity.  - CBC with Differential/Platelet - BASIC METABOLIC PANEL WITH GFR - Hepatic function panel - Lipid panel - EKG 12-Lead  Obesity  long discussion about weight loss, diet, and exercise  Prediabetes Continue weight loss Discussed general issues about diabetes pathophysiology and management.  Agricultural engineerducational material distributed.  Encouraged aerobic exercise. - Hemoglobin A1c - Insulin, fasting  Osteopenia DEXA   Osteoarthritis, unspecified osteoarthritis type, unspecified site controlled   Colon polyp Repeat 2020   Medication management - Magnesium  Vitamin D deficiency - VITAMIN D 25 Hydroxy (Vit-D Deficiency, Fractures)   Screening for blood or protein in urine - Urinalysis, Routine w reflex microscopic (not at [facility]) - Microalbumin / creatinine urine ratio  Encounter for general adult medical examination with abnormal findings - CBC with Differential/Platelet - BASIC METABOLIC PANEL WITH GFR - Hepatic function panel - TSH - Lipid panel - Hemoglobin A1c - Insulin, fasting - Magnesium - VITAMIN D 25 Hydroxy (Vit-D Deficiency, Fractures) - Urinalysis, Routine w reflex microscopic (not at [facility]) - Microalbumin / creatinine urine ratio - EKG 12-Lead  Abnormal MRI of head Had MRI 2004 and 2014, both showed cyst, unchanged in size, will monitor for symptoms  Discussed med's effects and SE's. Screening labs and tests as requested with regular follow-up as recommended. Over 40 minutes of exam, counseling, chart review, and complex, high level critical decision making was performed this visit.   HPI  55 y.o. female  presents for a complete physical.  Her  blood pressure has been controlled at home, today their BP is BP: 126/74 She does workout, walking for 30 mins at lunch everyday. She denies chest pain, shortness of breath, dizziness.  She is on cholesterol medication and denies myalgias. Her cholesterol is at goal. The cholesterol last visit was:   Lab Results  Component Value Date   CHOL 182 10/05/2015   HDL 44 (L) 10/05/2015   LDLCALC 97 10/05/2015   TRIG 203 (H) 10/05/2015   CHOLHDL 4.1 10/05/2015   She has been working on diet and exercise for prediabetes, and denies paresthesia of the feet, polydipsia, polyuria and visual disturbances. Last A1C in the office was:  Lab Results  Component Value Date   HGBA1C 6.1 (H) 10/05/2015   Last GFR: Lab Results  Component Value Date   GFRAA 66 10/05/2015   Patient is on Vitamin D supplement.   Lab Results  Component Value Date   VD25OH 48 10/05/2015     She is on thyroid medication. Her medication was changed last visit, she is on 1 pill Monday- Thursday, none on F,S,S.  Lab Results  Component Value Date   TSH 0.33 (L) 11/02/2015   BMI is Body mass index is 30.52 kg/m., she is working on diet and exercise. Wt Readings from Last 3 Encounters:  03/28/16 177 lb 12.8 oz (80.6 kg)  11/02/15 170 lb 6.4 oz (77.3 kg)  10/05/15 170 lb (77.1 kg)    Current Medications:  Current Outpatient Prescriptions on File Prior to Visit  Medication Sig Dispense Refill  . Cholecalciferol (VITAMIN D3) 5000 UNITS TABS Take 5,000 Units by mouth daily.    Marland Kitchen. levothyroxine (SYNTHROID, LEVOTHROID) 150 MCG tablet TAKE 1 TABLET (150 MCG TOTAL) BY MOUTH DAILY. 90 tablet 1  . meloxicam (MOBIC)  15 MG tablet TAKE 1 TABLET BY MOUTH EVERY DAY AS NEEDED FOR PAIN WITH FOOD. NO OTHER NSAIDS WITH THIS MEDICATION 90 tablet 1  . Multiple Vitamins-Minerals (HAIR/SKIN/NAILS PO) Take by mouth daily.    . Pyridoxine HCl (VITAMIN B6) 200 MG TABS Take by mouth daily.    . ranitidine (ZANTAC) 300 MG tablet Take 1 tablet (300  mg total) by mouth 2 (two) times daily. 60 tablet 1  . simvastatin (ZOCOR) 40 MG tablet TAKE 1 TABLET (40 MG TOTAL) BY MOUTH DAILY. 90 tablet 1  . vitamin B-12 (CYANOCOBALAMIN) 100 MCG tablet Take 100 mcg by mouth daily.     No current facility-administered medications on file prior to visit.    Health Maintenance:   Immunization History  Administered Date(s) Administered  . PPD Test 03/13/2014  . Tdap 04/11/2006   TDAP: 2008 Pneumovax: N/A Prevnar 13: N/A Flu vaccine: declines Zostavax: N/A  LMP: Hysterectomy Pap: 2016 MGM: 11/2015 DEXA: 2012 WNL Colonoscopy: 11/2013, repeat 2020 EGD: N/A  Patient Care Team: Lucky Cowboy, MD as PCP - General (Internal Medicine) Francene Boyers, MD as Referring Physician (Optometry) Rollene Rotunda, MD as Consulting Physician (Cardiology) Charna Elizabeth, MD as Consulting Physician (Gastroenterology) Marcene Corning, MD as Consulting Physician (Orthopedic Surgery)  Medical History:  Past Medical History:  Diagnosis Date  . Colon polyp   . Hot flashes   . Hypercholesteremia   . Hypothyroid   . Osteoarthritis    Allergies Allergies  Allergen Reactions  . Cephalexin     Throat swelling    SURGICAL HISTORY She  has a past surgical history that includes Cesarean section (80 & 82) and Vaginal hysterectomy (2002). FAMILY HISTORY Her family history includes Cancer in her maternal aunt, maternal aunt, mother, paternal aunt, paternal aunt, paternal aunt, and paternal aunt; Diabetes in her father and mother; Heart disease in her father and mother; Hypertension in her father and mother. SOCIAL HISTORY She  reports that she has never smoked. She has never used smokeless tobacco. She reports that she does not drink alcohol or use drugs.  Review of Systems: Review of Systems  Constitutional: Negative.   HENT: Negative.   Eyes: Negative.   Respiratory: Negative.   Cardiovascular: Negative.   Gastrointestinal: Negative.   Genitourinary:  Negative.   Musculoskeletal: Negative.   Skin: Negative.   Neurological: Negative.   Endo/Heme/Allergies: Negative.   Psychiatric/Behavioral: Negative.     Physical Exam: Estimated body mass index is 30.52 kg/m as calculated from the following:   Height as of this encounter: 5\' 4"  (1.626 m).   Weight as of this encounter: 177 lb 12.8 oz (80.6 kg). BP 126/74   Pulse 73   Temp 97.3 F (36.3 C)   Resp 16   Ht 5\' 4"  (1.626 m)   Wt 177 lb 12.8 oz (80.6 kg)   SpO2 98%   BMI 30.52 kg/m  General Appearance: Well nourished, in no apparent distress.  Eyes: PERRLA, EOMs, conjunctiva no swelling or erythema, normal fundi and vessels.  Sinuses: No Frontal/maxillary tenderness  ENT/Mouth: Ext aud canals clear, normal light reflex with TMs without erythema, bulging. Good dentition. No erythema, swelling, or exudate on post pharynx. Tonsils not swollen or erythematous. Hearing normal.  Neck: Supple, thyroid normal. No bruits  Respiratory: Respiratory effort normal, BS equal bilaterally without rales, rhonchi, wheezing or stridor.  Cardio: RRR without murmurs, rubs or gallops. Brisk peripheral pulses without edema.  Chest: symmetric, with normal excursions and percussion.  Breasts: Symmetric, without lumps,  nipple discharge, retractions.  Abdomen: Soft, nontender, no guarding, rebound, hernias, masses, or organomegaly.  Lymphatics: Non tender without lymphadenopathy.  Genitourinary: defer Musculoskeletal: Full ROM all peripheral extremities,5/5 strength, and normal gait.  Skin: Warm, dry without rashes, lesions, ecchymosis. Neuro: Cranial nerves intact, reflexes equal bilaterally. Normal muscle tone, no cerebellar symptoms. Sensation intact.  Psych: Awake and oriented X 3, normal affect, Insight and Judgment appropriate.   EKG: WNL no ST changes. AORTA SCAN: defer  Quentin MullingAmanda Keyler Hoge 2:13 PM [facility]

## 2016-03-28 NOTE — Patient Instructions (Addendum)
Please pick one of the over the counter allergy medications below and take it once daily for allergies.  Claritin or loratadine cheapest but likely the weakest  Zyrtec or certizine at night because it can make you sleepy The strongest is allegra or fexafinadine  Cheapest at [employer], [employer], [employer]   Simple math prevails.    1st - exercise does not produce significant weight loss - at best one converts fat into muscle , "bulks up", loses inches, but usually stays "weight neutral"     2nd - think of your body weightas a check book: If you eat more calories than you burn up - you save money or gain weight .... Or if you spend more money than you put in the check book, ie burn up more calories than you eat, then you lose weight     3rd - if you walk or run 1 mile, you burn up 100 calories - you have to burn up 3,500 calories to lose 1 pound, ie you have to walk/run 35 miles to lose 1 measly pound. So if you want to lose 10 #, then you have to walk/run 350 miles, so.... clearly exercise is not the solution.     4. So if you consume 1,500 calories, then you have to burn up the equivalent of 15 miles to stay weight neutral - It also stands to reason that if you consume 1,500 cal/day and don't lose weight, then you must be burning up about 1,500 cals/day to stay weight neutral.     5. If you really want to lose weight, you must cut your calorie intake 300 calories /day and at that rate you should lose about 1 # every 3 days.   6. Please purchase Dr Joel Fuhrman's book(s) "The End of Dieting" & "Eat to Live" . It has some great concepts and recipes.      

## 2016-03-29 LAB — BASIC METABOLIC PANEL WITH GFR
BUN: 10 mg/dL (ref 7–25)
CALCIUM: 9.8 mg/dL (ref 8.6–10.4)
CHLORIDE: 104 mmol/L (ref 98–110)
CO2: 29 mmol/L (ref 20–31)
CREATININE: 1.05 mg/dL (ref 0.50–1.05)
GFR, EST NON AFRICAN AMERICAN: 59 mL/min — AB (ref >=60)
GFR, Est African American: 68 mL/min (ref >=60)
Glucose, Bld: 89 mg/dL (ref 65–99)
Potassium: 4 mmol/L (ref 3.5–5.3)
SODIUM: 140 mmol/L (ref 135–146)

## 2016-03-29 LAB — INSULIN, FASTING: Insulin fasting, serum: 11.9 u[IU]/mL (ref 2.0–19.6)

## 2016-03-29 LAB — URINALYSIS, ROUTINE W REFLEX MICROSCOPIC
BILIRUBIN URINE: NEGATIVE
GLUCOSE, UA: NEGATIVE
Hgb urine dipstick: NEGATIVE
Ketones, ur: NEGATIVE
Nitrite: NEGATIVE
Protein, ur: NEGATIVE
SPECIFIC GRAVITY, URINE: 1.013 (ref 1.001–1.035)
pH: 5.5 (ref 5.0–8.0)

## 2016-03-29 LAB — HEMOGLOBIN A1C
HEMOGLOBIN A1C: 5.7 % — AB (ref <5.7)
MEAN PLASMA GLUCOSE: 117 mg/dL

## 2016-03-29 LAB — HEPATIC FUNCTION PANEL
ALT: 27 U/L (ref 6–29)
AST: 22 U/L (ref 10–35)
Albumin: 4.6 g/dL (ref 3.6–5.1)
Alkaline Phosphatase: 43 U/L (ref 33–130)
BILIRUBIN DIRECT: 0.1 mg/dL (ref <=0.2)
BILIRUBIN INDIRECT: 0.3 mg/dL (ref 0.2–1.2)
BILIRUBIN TOTAL: 0.4 mg/dL (ref 0.2–1.2)
Total Protein: 7.3 g/dL (ref 6.1–8.1)

## 2016-03-29 LAB — MICROALBUMIN / CREATININE URINE RATIO
Creatinine, Urine: 92 mg/dL (ref 20–320)
Microalb Creat Ratio: 8 mcg/mg creat (ref <30)
Microalb, Ur: 0.7 mg/dL

## 2016-03-29 LAB — LIPID PANEL
CHOL/HDL RATIO: 3.8 ratio (ref <5.0)
Cholesterol: 171 mg/dL (ref <200)
HDL: 45 mg/dL — ABNORMAL LOW (ref >50)
LDL Cholesterol: 109 mg/dL — ABNORMAL HIGH (ref <100)
Triglycerides: 83 mg/dL (ref <150)
VLDL: 17 mg/dL (ref <30)

## 2016-03-29 LAB — VITAMIN D 25 HYDROXY (VIT D DEFICIENCY, FRACTURES): Vit D, 25-Hydroxy: 53 ng/mL (ref 30–100)

## 2016-03-29 LAB — TSH: TSH: 1.39 m[IU]/L

## 2016-03-29 LAB — URINALYSIS, MICROSCOPIC ONLY
BACTERIA UA: NONE SEEN [HPF]
CASTS: NONE SEEN [LPF]
CRYSTALS: NONE SEEN [HPF]
RBC / HPF: NONE SEEN RBC/HPF (ref <=2)
Squamous Epithelial / LPF: NONE SEEN [HPF] (ref <=5)
Yeast: NONE SEEN [HPF]

## 2016-03-29 LAB — MAGNESIUM: Magnesium: 1.8 mg/dL (ref 1.5–2.5)

## 2016-06-27 ENCOUNTER — Ambulatory Visit (INDEPENDENT_AMBULATORY_CARE_PROVIDER_SITE_OTHER): Payer: 59 | Admitting: Physician Assistant

## 2016-06-27 ENCOUNTER — Encounter: Payer: Self-pay | Admitting: Physician Assistant

## 2016-06-27 VITALS — BP 110/70 | HR 77 | Temp 97.3°F | Resp 16 | Ht 64.0 in | Wt 173.0 lb

## 2016-06-27 DIAGNOSIS — M8580 Other specified disorders of bone density and structure, unspecified site: Secondary | ICD-10-CM

## 2016-06-27 DIAGNOSIS — Z79899 Other long term (current) drug therapy: Secondary | ICD-10-CM | POA: Diagnosis not present

## 2016-06-27 DIAGNOSIS — R7303 Prediabetes: Secondary | ICD-10-CM

## 2016-06-27 DIAGNOSIS — I10 Essential (primary) hypertension: Secondary | ICD-10-CM | POA: Diagnosis not present

## 2016-06-27 DIAGNOSIS — E785 Hyperlipidemia, unspecified: Secondary | ICD-10-CM | POA: Diagnosis not present

## 2016-06-27 DIAGNOSIS — E039 Hypothyroidism, unspecified: Secondary | ICD-10-CM | POA: Diagnosis not present

## 2016-06-27 LAB — HEPATIC FUNCTION PANEL
ALT: 24 U/L (ref 6–29)
AST: 20 U/L (ref 10–35)
Albumin: 4.6 g/dL (ref 3.6–5.1)
Alkaline Phosphatase: 43 U/L (ref 33–130)
BILIRUBIN DIRECT: 0.1 mg/dL (ref <=0.2)
BILIRUBIN INDIRECT: 0.4 mg/dL (ref 0.2–1.2)
BILIRUBIN TOTAL: 0.5 mg/dL (ref 0.2–1.2)
Total Protein: 7.2 g/dL (ref 6.1–8.1)

## 2016-06-27 LAB — CBC WITH DIFFERENTIAL/PLATELET
BASOS PCT: 0 %
Basophils Absolute: 0 cells/uL (ref 0–200)
Eosinophils Absolute: 76 cells/uL (ref 15–500)
Eosinophils Relative: 1 %
HEMATOCRIT: 36.5 % (ref 35.0–45.0)
Hemoglobin: 12.2 g/dL (ref 11.7–15.5)
Lymphocytes Relative: 56 %
Lymphs Abs: 4256 cells/uL — ABNORMAL HIGH (ref 850–3900)
MCH: 29 pg (ref 27.0–33.0)
MCHC: 33.4 g/dL (ref 32.0–36.0)
MCV: 86.7 fL (ref 80.0–100.0)
MONO ABS: 532 {cells}/uL (ref 200–950)
MPV: 10.4 fL (ref 7.5–12.5)
Monocytes Relative: 7 %
Neutro Abs: 2736 cells/uL (ref 1500–7800)
Neutrophils Relative %: 36 %
PLATELETS: 283 10*3/uL (ref 140–400)
RBC: 4.21 MIL/uL (ref 3.80–5.10)
RDW: 14.5 % (ref 11.0–15.0)
WBC: 7.6 10*3/uL (ref 3.8–10.8)

## 2016-06-27 LAB — LIPID PANEL
CHOL/HDL RATIO: 3.3 ratio (ref <5.0)
Cholesterol: 142 mg/dL (ref <200)
HDL: 43 mg/dL — ABNORMAL LOW (ref >50)
LDL Cholesterol: 80 mg/dL (ref <100)
TRIGLYCERIDES: 97 mg/dL (ref <150)
VLDL: 19 mg/dL (ref <30)

## 2016-06-27 LAB — BASIC METABOLIC PANEL WITH GFR
BUN: 13 mg/dL (ref 7–25)
CHLORIDE: 104 mmol/L (ref 98–110)
CO2: 27 mmol/L (ref 20–31)
Calcium: 9.6 mg/dL (ref 8.6–10.4)
Creat: 1.02 mg/dL (ref 0.50–1.05)
GFR, EST NON AFRICAN AMERICAN: 61 mL/min (ref >=60)
GFR, Est African American: 71 mL/min (ref >=60)
GLUCOSE: 82 mg/dL (ref 65–99)
POTASSIUM: 4.3 mmol/L (ref 3.5–5.3)
Sodium: 140 mmol/L (ref 135–146)

## 2016-06-27 LAB — TSH: TSH: 0.35 mIU/L — ABNORMAL LOW

## 2016-06-27 MED ORDER — LEVOTHYROXINE SODIUM 150 MCG PO TABS: ORAL_TABLET | ORAL | 1 refills | Status: DC

## 2016-06-27 NOTE — Progress Notes (Signed)
Assessment and Plan:   Prediabetes Stop sodas/juice, long discussion very close to DM Discussed general issues about diabetes pathophysiology and management., Educational material distributed., Suggested low cholesterol diet., Encouraged aerobic exercise., Discussed foot care., Reminded to get yearly retinal exam. - CBC with Differential/Platelet - BASIC METABOLIC PANEL WITH GFR - Hepatic function panel - Hemoglobin A1c - Insulin, fasting   Hyperlipidemia -continue medications, check lipids, decrease fatty foods, increase activity.  - Lipid panel   Obesity Obesity with co morbidities- long discussion about weight loss, diet, and exercise   Hypothyroidism, unspecified hypothyroidism type Hypothyroidism-check TSH level, continue medications the same, reminded to take on an empty stomach 30-70mins before food.  - TSH  Medication management - Magnesium   The patient was advised to call immediately if she has any concerning symptoms in the interval. The patient voices understanding of current treatment options and is in agreement with the current care plan. All questions were answered. The patient knows to call the clinic with any problems, questions or concerns or go to the ER if any further progression of symptoms.   HPI 56 y.o. AA female  presents for 3 month follow up   has a past medical history of Colon polyp; Hot flashes; Hypercholesteremia; Hypothyroid; and Osteoarthritis.   Her blood pressure has been controlled at home, today their BP is BP: 110/70  She does workout walks at lunch for . She denies chest pain, shortness of breath, dizziness.  She is on cholesterol medication and denies myalgias. Her cholesterol is at goal. The cholesterol last visit was:   Lab Results  Component Value Date   CHOL 171 03/28/2016   HDL 45 (L) 03/28/2016   LDLCALC 109 (H) 03/28/2016   TRIG 83 03/28/2016   CHOLHDL 3.8 03/28/2016   She has been working on diet and exercise for  prediabetes, and denies paresthesia of the feet, polydipsia, polyuria and visual disturbances. She does admit to drinking fruit juice and soda. Last A1C in the office was:  Lab Results  Component Value Date   HGBA1C 5.7 (H) 03/28/2016  Patient is not on Vitamin D supplement.   Lab Results  Component Value Date   VD25OH 53 03/28/2016    BMI is Body mass index is 29.7 kg/m., she is working on diet and exercise. Wt Readings from Last 3 Encounters:  06/27/16 173 lb (78.5 kg)  03/28/16 177 lb 12.8 oz (80.6 kg)  11/02/15 170 lb 6.4 oz (77.3 kg)   She is on thyroid medication. Her medication was not changed last visit.  Lab Results  Component Value Date   TSH 1.39 03/28/2016     Current Medications:  Current Outpatient Prescriptions on File Prior to Visit  Medication Sig Dispense Refill  . Cholecalciferol (VITAMIN D3) 5000 UNITS TABS Take 5,000 Units by mouth daily.    Marland Kitchen levothyroxine (SYNTHROID, LEVOTHROID) 150 MCG tablet TAKE 1 TABLET (150 MCG TOTAL) BY MOUTH DAILY. 90 tablet 1  . meloxicam (MOBIC) 15 MG tablet TAKE 1 TABLET BY MOUTH EVERY DAY AS NEEDED FOR PAIN WITH FOOD. NO OTHER NSAIDS WITH THIS MEDICATION 90 tablet 1  . Multiple Vitamins-Minerals (HAIR/SKIN/NAILS PO) Take by mouth daily.    . Pyridoxine HCl (VITAMIN B6) 200 MG TABS Take by mouth daily.    . ranitidine (ZANTAC) 300 MG tablet Take 1 tablet (300 mg total) by mouth 2 (two) times daily. 60 tablet 1  . simvastatin (ZOCOR) 40 MG tablet TAKE 1 TABLET (40 MG TOTAL) BY MOUTH DAILY. 90 tablet  1  . vitamin B-12 (CYANOCOBALAMIN) 100 MCG tablet Take 100 mcg by mouth daily.     No current facility-administered medications on file prior to visit.    Medical History:  Past Medical History:  Diagnosis Date  . Colon polyp   . Hot flashes   . Hypercholesteremia   . Hypothyroid   . Osteoarthritis    Allergies:  Allergies  Allergen Reactions  . Cephalexin     Throat swelling     Review of Systems:  Review of Systems   Constitutional: Positive for malaise/fatigue. Negative for chills, diaphoresis, fever and weight loss.  HENT: Negative for congestion, ear discharge, ear pain, hearing loss, nosebleeds, sore throat and tinnitus.   Respiratory: Negative.  Negative for stridor.   Cardiovascular: Positive for chest pain. Negative for palpitations, orthopnea, claudication, leg swelling and PND.  Gastrointestinal: Negative.   Genitourinary: Negative.  Negative for dysuria and frequency.  Musculoskeletal: Negative for back pain, falls, joint pain, myalgias and neck pain.  Skin: Negative.   Neurological: Positive for dizziness and headaches. Negative for tingling, tremors, sensory change, speech change, focal weakness, seizures, loss of consciousness and weakness.  Psychiatric/Behavioral: Positive for memory loss. Negative for depression, hallucinations, substance abuse and suicidal ideas. The patient is not nervous/anxious and does not have insomnia.     Family history- Review and unchanged Social history- Review and unchanged Physical Exam: BP 110/70   Pulse 77   Temp 97.3 F (36.3 C)   Resp 16   Ht 5\' 4"  (1.626 m)   Wt 173 lb (78.5 kg)   SpO2 98%   BMI 29.70 kg/m  Wt Readings from Last 3 Encounters:  06/27/16 173 lb (78.5 kg)  03/28/16 177 lb 12.8 oz (80.6 kg)  11/02/15 170 lb 6.4 oz (77.3 kg)   General Appearance: Well nourished, in no apparent distress. Eyes: PERRLA, EOMs, conjunctiva no swelling or erythema Sinuses: No Frontal/maxillary tenderness ENT/Mouth: Ext aud canals clear, TMs without erythema, bulging. No erythema, swelling, or exudate on post pharynx.  Tonsils not swollen or erythematous. Hearing normal. + TMJ tenderness, no abscess appreciated.  Neck: Supple, thyroid normal.  Respiratory: Respiratory effort normal, BS equal bilaterally without rales, rhonchi, wheezing or stridor.  Cardio: RRR with no MRGs. Brisk peripheral pulses without edema.  Abdomen: Soft, + BS,  + epigastric  tender, no guarding, rebound, hernias, masses. Lymphatics: Non tender without lymphadenopathy.  Musculoskeletal: Full ROM, 5/5 strength,   Skin: Warm, dry without rashes, lesions, ecchymosis.  Neuro: Cranial nerves intact. Normal muscle tone, no cerebellar symptoms. Psych: Awake and oriented X 3, normal affect, Insight and Judgment appropriate.    Quentin MullingAmanda Jaylenn Altier, PA-C 3:39 PM [facility]

## 2016-06-27 NOTE — Patient Instructions (Signed)
Follow up eye doctor  What is the TMJ? The temporomandibular (tem-PUH-ro-man-DIB-yoo-ler) joint, or the TMJ, connects the upper and lower jawbones. This joint allows the jaw to open wide and move back and forth when you chew, talk, or yawn.There are also several muscles that help this joint move. There can be muscle tightness and pain in the muscle that can cause several symptoms.  What causes TMJ pain? There are many causes of TMJ pain. Repeated chewing (for example, chewing gum) and clenching your teeth can cause pain in the joint. Some TMJ pain has no obvious cause. What can I do to ease the pain? There are many things you can do to help your pain get better. When you have pain:  Eat soft foods and stay away from chewy foods (for example, taffy) Try to use both sides of your mouth to chew Don't chew gum Massage Don't open your mouth wide (for example, during yawning or singing) Don't bite your cheeks or fingernails Lower your amount of stress and worry Applying a warm, damp washcloth to the joint may help. Over-the-counter pain medicines such as ibuprofen (one brand: Advil) or acetaminophen (one brand: Tylenol) might also help. Do not use these medicines if you are allergic to them or if your doctor told you not to use them. How can I stop the pain from coming back? When your pain is better, you can do these exercises to make your muscles stronger and to keep the pain from coming back:  Resisted mouth opening: Place your thumb or two fingers under your chin and open your mouth slowly, pushing up lightly on your chin with your thumb. Hold for three to six seconds. Close your mouth slowly. Resisted mouth closing: Place your thumbs under your chin and your two index fingers on the ridge between your mouth and the bottom of your chin. Push down lightly on your chin as you close your mouth. Tongue up: Slowly open and close your mouth while keeping the tongue touching the roof of the  mouth. Side-to-side jaw movement: Place an object about one fourth of an inch thick (for example, two tongue depressors) between your front teeth. Slowly move your jaw from side to side. Increase the thickness of the object as the exercise becomes easier Forward jaw movement: Place an object about one fourth of an inch thick between your front teeth and move the bottom jaw forward so that the bottom teeth are in front of the top teeth. Increase the thickness of the object as the exercise becomes easier. These exercises should not be painful. If it hurts to do these exercises, stop doing them and talk to your family doctor.

## 2016-06-28 LAB — HEMOGLOBIN A1C
Hgb A1c MFr Bld: 5.7 % — ABNORMAL HIGH (ref <5.7)
Mean Plasma Glucose: 117 mg/dL

## 2016-06-28 LAB — MAGNESIUM: Magnesium: 2 mg/dL (ref 1.5–2.5)

## 2016-09-26 ENCOUNTER — Other Ambulatory Visit: Payer: Self-pay | Admitting: Physician Assistant

## 2016-09-26 DIAGNOSIS — Z1231 Encounter for screening mammogram for malignant neoplasm of breast: Secondary | ICD-10-CM

## 2016-10-05 DIAGNOSIS — H25013 Cortical age-related cataract, bilateral: Secondary | ICD-10-CM | POA: Diagnosis not present

## 2016-10-05 DIAGNOSIS — H40022 Open angle with borderline findings, high risk, left eye: Secondary | ICD-10-CM | POA: Diagnosis not present

## 2016-10-05 DIAGNOSIS — H401111 Primary open-angle glaucoma, right eye, mild stage: Secondary | ICD-10-CM | POA: Diagnosis not present

## 2016-10-25 DIAGNOSIS — H40013 Open angle with borderline findings, low risk, bilateral: Secondary | ICD-10-CM | POA: Diagnosis not present

## 2016-10-25 DIAGNOSIS — H2513 Age-related nuclear cataract, bilateral: Secondary | ICD-10-CM | POA: Diagnosis not present

## 2016-10-25 DIAGNOSIS — H40033 Anatomical narrow angle, bilateral: Secondary | ICD-10-CM | POA: Diagnosis not present

## 2016-10-28 ENCOUNTER — Encounter: Payer: Self-pay | Admitting: Physician Assistant

## 2016-10-28 ENCOUNTER — Other Ambulatory Visit: Payer: Self-pay | Admitting: Physician Assistant

## 2016-10-28 MED ORDER — SULFAMETHOXAZOLE-TRIMETHOPRIM 800-160 MG PO TABS: 1.0000 | ORAL_TABLET | Freq: Two times a day (BID) | ORAL | 0 refills | Status: DC

## 2016-10-28 MED ORDER — PREDNISONE 20 MG PO TABS: ORAL_TABLET | ORAL | 0 refills | Status: DC

## 2016-11-10 ENCOUNTER — Ambulatory Visit
Admission: RE | Admit: 2016-11-10 | Discharge: 2016-11-10 | Disposition: A | Discharge status: Home / Self Care | Payer: 59 | Source: Ambulatory Visit | Attending: Physician Assistant | Admitting: Physician Assistant

## 2016-11-10 DIAGNOSIS — M8580 Other specified disorders of bone density and structure, unspecified site: Secondary | ICD-10-CM | POA: Diagnosis not present

## 2016-11-10 DIAGNOSIS — Z1231 Encounter for screening mammogram for malignant neoplasm of breast: Secondary | ICD-10-CM

## 2016-11-11 NOTE — Progress Notes (Signed)
Pt aware of lab results & voiced understanding of those results.

## 2016-11-21 ENCOUNTER — Other Ambulatory Visit: Payer: Self-pay | Admitting: Internal Medicine

## 2016-11-22 ENCOUNTER — Other Ambulatory Visit: Payer: Self-pay | Admitting: Physician Assistant

## 2016-11-22 DIAGNOSIS — E785 Hyperlipidemia, unspecified: Secondary | ICD-10-CM

## 2016-12-28 ENCOUNTER — Other Ambulatory Visit: Payer: Self-pay | Admitting: Physician Assistant

## 2016-12-28 DIAGNOSIS — E785 Hyperlipidemia, unspecified: Secondary | ICD-10-CM

## 2016-12-28 DIAGNOSIS — E039 Hypothyroidism, unspecified: Secondary | ICD-10-CM

## 2017-01-27 ENCOUNTER — Other Ambulatory Visit: Payer: Self-pay | Admitting: Internal Medicine

## 2017-01-27 ENCOUNTER — Other Ambulatory Visit: Payer: Self-pay | Admitting: Physician Assistant

## 2017-01-27 DIAGNOSIS — E039 Hypothyroidism, unspecified: Secondary | ICD-10-CM

## 2017-01-27 DIAGNOSIS — E785 Hyperlipidemia, unspecified: Secondary | ICD-10-CM

## 2017-03-03 ENCOUNTER — Other Ambulatory Visit: Payer: Self-pay | Admitting: Physician Assistant

## 2017-03-03 ENCOUNTER — Other Ambulatory Visit: Payer: Self-pay | Admitting: Internal Medicine

## 2017-03-03 DIAGNOSIS — E039 Hypothyroidism, unspecified: Secondary | ICD-10-CM

## 2017-03-03 DIAGNOSIS — E785 Hyperlipidemia, unspecified: Secondary | ICD-10-CM

## 2017-04-10 ENCOUNTER — Encounter: Payer: Self-pay | Admitting: Physician Assistant

## 2017-04-10 ENCOUNTER — Ambulatory Visit: Payer: 59 | Admitting: Physician Assistant

## 2017-04-10 VITALS — BP 132/64 | HR 85 | Temp 97.3°F | Resp 14 | Ht 64.0 in | Wt 179.8 lb

## 2017-04-10 DIAGNOSIS — Z6830 Body mass index (BMI) 30.0-30.9, adult: Secondary | ICD-10-CM

## 2017-04-10 DIAGNOSIS — Z23 Encounter for immunization: Secondary | ICD-10-CM

## 2017-04-10 DIAGNOSIS — Z0000 Encounter for general adult medical examination without abnormal findings: Secondary | ICD-10-CM

## 2017-04-10 DIAGNOSIS — Z136 Encounter for screening for cardiovascular disorders: Secondary | ICD-10-CM | POA: Diagnosis not present

## 2017-04-10 DIAGNOSIS — R7303 Prediabetes: Secondary | ICD-10-CM

## 2017-04-10 DIAGNOSIS — E039 Hypothyroidism, unspecified: Secondary | ICD-10-CM

## 2017-04-10 DIAGNOSIS — I10 Essential (primary) hypertension: Secondary | ICD-10-CM

## 2017-04-10 DIAGNOSIS — E559 Vitamin D deficiency, unspecified: Secondary | ICD-10-CM

## 2017-04-10 DIAGNOSIS — R413 Other amnesia: Secondary | ICD-10-CM

## 2017-04-10 DIAGNOSIS — K635 Polyp of colon: Secondary | ICD-10-CM

## 2017-04-10 DIAGNOSIS — Z79899 Other long term (current) drug therapy: Secondary | ICD-10-CM

## 2017-04-10 DIAGNOSIS — M1990 Unspecified osteoarthritis, unspecified site: Secondary | ICD-10-CM

## 2017-04-10 DIAGNOSIS — M8580 Other specified disorders of bone density and structure, unspecified site: Secondary | ICD-10-CM

## 2017-04-10 DIAGNOSIS — E785 Hyperlipidemia, unspecified: Secondary | ICD-10-CM

## 2017-04-10 DIAGNOSIS — Z0001 Encounter for general adult medical examination with abnormal findings: Secondary | ICD-10-CM

## 2017-04-10 DIAGNOSIS — R930 Abnormal findings on diagnostic imaging of skull and head, not elsewhere classified: Secondary | ICD-10-CM

## 2017-04-10 NOTE — Patient Instructions (Signed)
    Bad carbs also include fruit juice, alcohol, and sweet tea. These are empty calories that do not signal to your brain that you are full.   Please remember the good carbs are still carbs which convert into sugar. So please measure them out no more than 1/2-1 cup of rice, oatmeal, pasta, and beans  Veggies are however free foods! Pile them on.   Not all fruit is created equal. Please see the list below, the fruit at the bottom is higher in sugars than the fruit at the top. Please avoid all dried fruits.     Management of Memory Problems  There are some general things you can do to help manage your memory problems.  Your memory may not in fact recover, but by using techniques and strategies you will be able to manage your memory difficulties better.  1)  Establish a routine.  Try to establish and then stick to a regular routine.  By doing this, you will get used to what to expect and you will reduce the need to rely on your memory.  Also, try to do things at the same time of day, such as taking your medication or checking your calendar first thing in the morning.  Think about think that you can do as a part of a regular routine and make a list.  Then enter them into a daily planner to remind you.  This will help you establish a routine.  2)  Organize your environment.  Organize your environment so that it is uncluttered.  Decrease visual stimulation.  Place everyday items such as keys or cell phone in the same place every day (ie.  Basket next to front door)  Use post it notes with a brief message to yourself (ie. Turn off light, lock the door)  Use labels to indicate where things go (ie. Which cupboards are for food, dishes, etc.)  Keep a notepad and pen by the telephone to take messages  3)  Memory Aids  A diary or journal/notebook/daily planner  Making a list (shopping list, chore list, to do list that needs to be done)  Using an alarm as a reminder (kitchen timer or cell  phone alarm)  Using cell phone to store information (Notes, Calendar, Reminders)  Calendar/White board placed in a prominent position  Post-it notes  In order for memory aids to be useful, you need to have good habits.  It's no good remembering to make a note in your journal if you don't remember to look in it.  Try setting aside a certain time of day to look in journal.  4)  Improving mood and managing fatigue.  There may be other factors that contribute to memory difficulties.  Factors, such as anxiety, depression and tiredness can affect memory.  Regular gentle exercise can help improve your mood and give you more energy.  Simple relaxation techniques may help relieve symptoms of anxiety  Try to get back to completing activities or hobbies you enjoyed doing in the past.  Learn to pace yourself through activities to decrease fatigue.  Find out about some local support groups where you can share experiences with others.  Try and achieve 7-8 hours of sleep at night. 

## 2017-04-10 NOTE — Progress Notes (Signed)
Complete Physical  Assessment and Plan: Hypothyroidism, unspecified hypothyroidism type Hypothyroidism-check TSH level, continue medications the same, reminded to take on an empty stomach 30-5660mins before food.  - TSH  Hyperlipidemia -continue medications, check lipids, decrease fatty foods, increase activity.  - CBC with Differential/Platelet - BASIC METABOLIC PANEL WITH GFR - Hepatic function panel - Lipid panel - EKG 12-Lead  Prediabetes Continue weight loss Discussed general issues about diabetes pathophysiology and management.  Agricultural engineerducational material distributed.  Encouraged aerobic exercise. - Hemoglobin A1c - Insulin, fasting  Osteopenia Will get at 63  Osteoarthritis, unspecified osteoarthritis type, unspecified site controlled   Colon polyp Repeat 2020   Medication management - Magnesium  Vitamin D deficiency - VITAMIN D 25 Hydroxy (Vit-D Deficiency, Fractures)   Screening for blood or protein in urine - Urinalysis, Routine w reflex microscopic (not [facility]) - Microalbumin / creatinine urine ratio  Encounter for general adult medical examination with abnormal findings 1 year  Abnormal MRI of head Had MRI 2004 and 2014, both showed cyst, unchanged in size, however she has been having worsening memory, will ref neuro for evaluation normal B12, normal ferritin, negative HIV, RPR, she had a positive ANA but negative antiDNA. No tick exposure.  Need for Tdap vaccination -     Tdap vaccine greater than or equal to 7yo IM  BMI 30.0-30.9,adult - follow up 3 months for progress monitoring - increase veggies, decrease carbs - long discussion about weight loss, diet, and exercise   Discussed med's effects and SE's. Screening labs and tests as requested with regular follow-up as recommended. Over 40 minutes of exam, counseling, chart review, and complex, high level critical decision making was performed this visit.   HPI  56 y.o. AA female  presents for a  complete physical.  Her blood pressure has been controlled at home, today their BP is BP: 132/64 She does not workout, due to weather and sister hurt her foot.  She denies chest pain, shortness of breath, dizziness.  She had abnormal MRI in 2014, saw [facility], will request records, states memory has gotten worse. Will forget to do things people tell her, has headaches right sided with occ ringing in right ear, no dizziness, hearing loss, denies increased stress, changes in vision, weakness, tremors. Had negative work up June 2017, normal B12, normal ferritin, negative HIV, RPR, she had a positive ANA but negative antiDNA. No tick exposure.  She is on cholesterol medication and denies myalgias. Her cholesterol is at goal. The cholesterol last visit was:   Lab Results  Component Value Date   CHOL 142 06/27/2016   HDL 43 (L) 06/27/2016   LDLCALC 80 06/27/2016   TRIG 97 06/27/2016   CHOLHDL 3.3 06/27/2016   She has been working on diet and exercise for prediabetes, and denies paresthesia of the feet, polydipsia, polyuria and visual disturbances. Last A1C in the office was:  Lab Results  Component Value Date   HGBA1C 5.7 (H) 06/27/2016   Last GFR: Lab Results  Component Value Date   GFRAA 71 06/27/2016   Patient is on Vitamin D supplement.   Lab Results  Component Value Date   VD25OH 53 03/28/2016     She is on thyroid medication. Her medication was not changed last visit, she is on 1 pill Monday- Thursday, none on F,S,S.  Lab Results  Component Value Date   TSH 0.35 (L) 06/27/2016   BMI is Body mass index is 30.86 kg/m., she is working on diet and exercise.  Wt Readings from Last 3 Encounters:  04/10/17 179 lb 12.8 oz (81.6 kg)  06/27/16 173 lb (78.5 kg)  03/28/16 177 lb 12.8 oz (80.6 kg)    Current Medications:  Current Outpatient Medications on File Prior to Visit  Medication Sig Dispense Refill  . Cholecalciferol (VITAMIN D3) 5000 UNITS TABS Take 5,000 Units by mouth  daily.    Marland Kitchen levothyroxine (SYNTHROID, LEVOTHROID) 150 MCG tablet TAKE 1 TABLET (150 MCG TOTAL) BY MOUTH DAILY. 90 tablet 0  . Multiple Vitamins-Minerals (HAIR/SKIN/NAILS PO) Take by mouth daily.    . Pyridoxine HCl (VITAMIN B6) 200 MG TABS Take by mouth daily.    . simvastatin (ZOCOR) 40 MG tablet TAKE 1 TABLET BY MOUTH EVERY DAY 40 tablet 0  . vitamin B-12 (CYANOCOBALAMIN) 100 MCG tablet Take 100 mcg by mouth daily.    . ranitidine (ZANTAC) 300 MG tablet Take 1 tablet (300 mg total) by mouth 2 (two) times daily. 60 tablet 1   No current facility-administered medications on file prior to visit.    Health Maintenance:   Immunization History  Administered Date(s) Administered  . PPD Test 03/13/2014  . Tdap 04/11/2006   TDAP: 2008 will get today Pneumovax: N/A Prevnar 13: N/A Flu vaccine: declines Zostavax: N/A  LMP: Hysterectomy Pap: 2016 MGM: 11/2016 DEXA: 2012 WNL Colonoscopy: 11/2013, repeat 2020 EGD: N/A MRI brain 2004/2014- abnormal but unchanged  Patient Care Team: Lucky Cowboy, MD as PCP - General (Internal Medicine) Hanley Seamen Dustin Folks, MD as Referring Physician (Optometry) Rollene Rotunda, MD as Consulting Physician (Cardiology) Charna Elizabeth, MD as Consulting Physician (Gastroenterology) Marcene Corning, MD as Consulting Physician (Orthopedic Surgery)  Medical History:  Past Medical History:  Diagnosis Date  . Colon polyp   . Hot flashes   . Hypercholesteremia   . Hypothyroid   . Osteoarthritis    Allergies Allergies  Allergen Reactions  . Cephalexin     Throat swelling    SURGICAL HISTORY She  has a past surgical history that includes Cesarean section (80 & 82) and Vaginal hysterectomy (2002). FAMILY HISTORY Her family history includes Cancer in her maternal aunt, maternal aunt, mother, paternal aunt, paternal aunt, paternal aunt, and paternal aunt; Diabetes in her father and mother; Heart disease in her father and mother; Hypertension in her father  and mother. SOCIAL HISTORY She  reports that  has never smoked. she has never used smokeless tobacco. She reports that she does not drink alcohol or use drugs.  Review of Systems: Review of Systems  Constitutional: Negative.   HENT: Negative.   Eyes: Negative.   Respiratory: Negative.   Cardiovascular: Negative.   Gastrointestinal: Negative.   Genitourinary: Negative.   Musculoskeletal: Negative.   Skin: Negative.   Neurological: Negative.   Endo/Heme/Allergies: Negative.   Psychiatric/Behavioral: Negative.     Physical Exam: Estimated body mass index is 30.86 kg/m as calculated from the following:   Height as of this encounter: 5\' 4"  (1.626 m).   Weight as of this encounter: 179 lb 12.8 oz (81.6 kg). BP 132/64   Pulse 85   Temp (!) 97.3 F (36.3 C) (Temporal)   Resp 14   Ht 5\' 4"  (1.626 m)   Wt 179 lb 12.8 oz (81.6 kg)   BMI 30.86 kg/m  General Appearance: Well nourished, in no apparent distress.  Eyes: PERRLA, EOMs, conjunctiva no swelling or erythema, normal fundi and vessels.  Sinuses: No Frontal/maxillary tenderness  ENT/Mouth: Ext aud canals clear, normal light reflex with TMs without erythema, bulging. Good  dentition. No erythema, swelling, or exudate on post pharynx. Tonsils not swollen or erythematous. Hearing normal.  Neck: Supple, thyroid normal. No bruits  Respiratory: Respiratory effort normal, BS equal bilaterally without rales, rhonchi, wheezing or stridor.  Cardio: RRR without murmurs, rubs or gallops. Brisk peripheral pulses without edema.  Chest: symmetric, with normal excursions and percussion.  Breasts: Symmetric, without lumps, nipple discharge, retractions.  Abdomen: Soft, nontender, no guarding, rebound, hernias, masses, or organomegaly.  Lymphatics: Non tender without lymphadenopathy.  Genitourinary: defer Musculoskeletal: Full ROM all peripheral extremities,5/5 strength, and normal gait.  Skin: Warm, dry without rashes, lesions,  ecchymosis. Neuro: Cranial nerves intact, reflexes equal bilaterally. Normal muscle tone, no cerebellar symptoms. Sensation intact.  Psych: Awake and oriented X 3, normal affect, Insight and Judgment appropriate.   EKG: WNL no ST changes. AORTA SCAN: defer  Quentin MullingAmanda Mitchelle Sultan 3:11 PM [facility]

## 2017-04-11 LAB — CBC WITH DIFFERENTIAL/PLATELET
BASOS PCT: 0.5 %
Basophils Absolute: 41 cells/uL (ref 0–200)
EOS PCT: 0.6 %
Eosinophils Absolute: 49 cells/uL (ref 15–500)
HCT: 37 % (ref 35.0–45.0)
HEMOGLOBIN: 12.6 g/dL (ref 11.7–15.5)
Lymphs Abs: 3532 cells/uL (ref 850–3900)
MCH: 29.2 pg (ref 27.0–33.0)
MCHC: 34.1 g/dL (ref 32.0–36.0)
MCV: 85.6 fL (ref 80.0–100.0)
MONOS PCT: 9 %
MPV: 10.9 fL (ref 7.5–12.5)
NEUTROS ABS: 3750 {cells}/uL (ref 1500–7800)
Neutrophils Relative %: 46.3 %
PLATELETS: 292 10*3/uL (ref 140–400)
RBC: 4.32 10*6/uL (ref 3.80–5.10)
RDW: 13 % (ref 11.0–15.0)
TOTAL LYMPHOCYTE: 43.6 %
WBC mixed population: 729 cells/uL (ref 200–950)
WBC: 8.1 10*3/uL (ref 3.8–10.8)

## 2017-04-11 LAB — BASIC METABOLIC PANEL WITH GFR
BUN / CREAT RATIO: 9 (calc) (ref 6–22)
BUN: 10 mg/dL (ref 7–25)
CO2: 34 mmol/L — ABNORMAL HIGH (ref 20–32)
CREATININE: 1.17 mg/dL — AB (ref 0.50–1.05)
Calcium: 10.5 mg/dL — ABNORMAL HIGH (ref 8.6–10.4)
Chloride: 103 mmol/L (ref 98–110)
GFR, EST NON AFRICAN AMERICAN: 51 mL/min/{1.73_m2} — AB (ref >=60)
GFR, Est African American: 59 mL/min/{1.73_m2} — ABNORMAL LOW (ref >=60)
GLUCOSE: 107 mg/dL — AB (ref 65–99)
Potassium: 4.4 mmol/L (ref 3.5–5.3)
Sodium: 143 mmol/L (ref 135–146)

## 2017-04-11 LAB — URINALYSIS, ROUTINE W REFLEX MICROSCOPIC
BILIRUBIN URINE: NEGATIVE
Bacteria, UA: NONE SEEN /HPF
GLUCOSE, UA: NEGATIVE
HGB URINE DIPSTICK: NEGATIVE
Hyaline Cast: NONE SEEN /LPF
NITRITE: NEGATIVE
PROTEIN: NEGATIVE
RBC / HPF: NONE SEEN /HPF (ref 0–2)
Specific Gravity, Urine: 1.023 (ref 1.001–1.03)
Squamous Epithelial / LPF: NONE SEEN /HPF (ref <=5)
pH: 5.5 (ref 5.0–8.0)

## 2017-04-11 LAB — HEMOGLOBIN A1C
Hgb A1c MFr Bld: 6 % of total Hgb — ABNORMAL HIGH (ref <5.7)
MEAN PLASMA GLUCOSE: 126 (calc)
eAG (mmol/L): 7 (calc)

## 2017-04-11 LAB — LIPID PANEL
CHOL/HDL RATIO: 4.3 (calc) (ref <5.0)
CHOLESTEROL: 188 mg/dL (ref <200)
HDL: 44 mg/dL — AB (ref >50)
LDL CHOLESTEROL (CALC): 116 mg/dL — AB
Non-HDL Cholesterol (Calc): 144 mg/dL (calc) — ABNORMAL HIGH (ref <130)
Triglycerides: 161 mg/dL — ABNORMAL HIGH (ref <150)

## 2017-04-11 LAB — HEPATIC FUNCTION PANEL
AG RATIO: 1.7 (calc) (ref 1.0–2.5)
ALT: 31 U/L — ABNORMAL HIGH (ref 6–29)
AST: 23 U/L (ref 10–35)
Albumin: 4.7 g/dL (ref 3.6–5.1)
Alkaline phosphatase (APISO): 51 U/L (ref 33–130)
BILIRUBIN INDIRECT: 0.3 mg/dL (ref 0.2–1.2)
Bilirubin, Direct: 0.1 mg/dL (ref 0.0–0.2)
GLOBULIN: 2.7 g/dL (ref 1.9–3.7)
TOTAL PROTEIN: 7.4 g/dL (ref 6.1–8.1)
Total Bilirubin: 0.4 mg/dL (ref 0.2–1.2)

## 2017-04-11 LAB — MAGNESIUM: Magnesium: 2.1 mg/dL (ref 1.5–2.5)

## 2017-04-11 LAB — TSH: TSH: 1.22 mIU/L (ref 0.40–4.50)

## 2017-04-11 LAB — MICROALBUMIN / CREATININE URINE RATIO
Creatinine, Urine: 223 mg/dL (ref 20–275)
Microalb Creat Ratio: 4 mcg/mg creat (ref <30)
Microalb, Ur: 1 mg/dL

## 2017-04-11 LAB — VITAMIN D 25 HYDROXY (VIT D DEFICIENCY, FRACTURES): Vit D, 25-Hydroxy: 34 ng/mL (ref 30–100)

## 2017-06-06 ENCOUNTER — Encounter: Payer: Self-pay | Admitting: Neurology

## 2017-06-06 ENCOUNTER — Ambulatory Visit: Payer: 59 | Admitting: Neurology

## 2017-06-06 VITALS — BP 136/74 | HR 74 | Ht 64.0 in | Wt 181.0 lb

## 2017-06-06 DIAGNOSIS — R930 Abnormal findings on diagnostic imaging of skull and head, not elsewhere classified: Secondary | ICD-10-CM | POA: Diagnosis not present

## 2017-06-06 DIAGNOSIS — R413 Other amnesia: Secondary | ICD-10-CM | POA: Insufficient documentation

## 2017-06-06 NOTE — Progress Notes (Signed)
PATIENT: Vicki Wright DOB: 12/02/1960  Chief Complaint  Patient presents with  . Abnormal MRI    MMSE 29/30 - 11 animals. Last MRI brain was 02/26/13.  Reports feeling as if she has muscle cramps in her head rather than normal headaches.  Feels memory is mildly worse.  Marland Kitchen. PCP    Quentin Mullingollier, Amanda, PA-C     HISTORICAL  Vicki Wright is a 57 year old female, seen in refer by primary care PA collier, Marchelle Folksmanda, for evaluation of abnormal MRI of the brain.  initial evaluation was on June 06, 2017.  She has past medical history of hypothyroidism on supplement, hyperlipidemia, she works at [employer], sought the female for the whole building, she has been doing the job for the city [place] for over 30 years, since 2017, she noticed mild memory loss, it took her longer to complete her job, at home, when she go upstairs, she forgot why she was there,  She denies trouble driving, she is a Buyer, retailgraduate from high school, mother suffered dementia at age 59,  In addition, she complains of intermittent headaches, couple times each week,  REVIEW OF SYSTEMS: Full 14 system review of systems performed and notable only for memory loss  ALLERGIES: Allergies  Allergen Reactions  . Cephalexin     Throat swelling    HOME MEDICATIONS: Current Outpatient Medications  Medication Sig Dispense Refill  . Cholecalciferol (VITAMIN D3) 5000 UNITS TABS Take 5,000 Units by mouth daily.    Marland Kitchen. levothyroxine (SYNTHROID, LEVOTHROID) 150 MCG tablet TAKE 1 TABLET (150 MCG TOTAL) BY MOUTH DAILY. 90 tablet 0  . Multiple Vitamins-Minerals (HAIR/SKIN/NAILS PO) Take by mouth daily.    . Pyridoxine HCl (VITAMIN B6) 200 MG TABS Take by mouth daily.    . simvastatin (ZOCOR) 40 MG tablet TAKE 1 TABLET BY MOUTH EVERY DAY 40 tablet 0  . vitamin B-12 (CYANOCOBALAMIN) 100 MCG tablet Take 100 mcg by mouth daily.    . ranitidine (ZANTAC) 300 MG tablet Take 1 tablet (300 mg total) by mouth 2 (two) times daily. 60 tablet 1    No current facility-administered medications for this visit.     PAST MEDICAL HISTORY: Past Medical History:  Diagnosis Date  . Abnormal MRI   . Colon polyp   . Hot flashes   . Hypercholesteremia   . Hypothyroid   . Memory loss   . Osteoarthritis     PAST SURGICAL HISTORY: Past Surgical History:  Procedure Laterality Date  . CESAREAN SECTION  82 & 84  . VAGINAL HYSTERECTOMY  2002    FAMILY HISTORY: Family History  Problem Relation Age of Onset  . Cancer Mother   . Hypertension Mother   . Heart disease Mother   . Diabetes Mother   . Hypertension Father   . Heart disease Father   . Diabetes Father   . Cancer Maternal Aunt   . Cancer Paternal Aunt   . Cancer Maternal Aunt   . Cancer Paternal Aunt   . Cancer Paternal Aunt   . Cancer Paternal Aunt     SOCIAL HISTORY:  Social History   Socioeconomic History  . Marital status: Divorced    Spouse name: Not on file  . Number of children: 2  . Years of education: 7612  . Highest education level: Not on file  Social Needs  . Financial resource strain: Not on file  . Food insecurity - worry: Not on file  . Food insecurity - inability: Not on  file  . Transportation needs - medical: Not on file  . Transportation needs - non-medical: Not on file  Occupational History    Comment: [employer]  Tobacco Use  . Smoking status: Never Smoker  . Smokeless tobacco: Never Used  Substance and Sexual Activity  . Alcohol use: No  . Drug use: No  . Sexual activity: Not on file  Other Topics Concern  . Not on file  Social History Narrative   Patient is single and her daughter and family live with her.   Patient is working full-time.   Patient has a high school education.   Patient has two children.   Patient is left-handed.   Patient does drink maybe one coke once a week.     PHYSICAL EXAM   Vitals:   06/06/17 1424  BP: 136/74  Pulse: 74  Weight: 181 lb (82.1 kg)  Height: 5\' 4"  (1.626 m)    Not  recorded      Body mass index is 31.07 kg/m.  PHYSICAL EXAMNIATION:  Gen: NAD, conversant, well nourised, obese, well groomed                     Cardiovascular: Regular rate rhythm, no peripheral edema, warm, nontender. Eyes: Conjunctivae clear without exudates or hemorrhage Neck: Supple, no carotid bruits. Pulmonary: Clear to auscultation bilaterally   NEUROLOGICAL EXAM:  MMSE - Mini Mental State Exam 06/06/2017  Orientation to time 5  Orientation to Place 5  Registration 2  Attention/ Calculation 5  Recall 3  Language- name 2 objects 2  Language- repeat 1  Language- follow 3 step command 3  Language- read & follow direction 1  Write a sentence 1  Copy design 1  Total score 29  Animal naming 11   CRANIAL NERVES: CN II: Visual fields are full to confrontation. Fundoscopic exam is normal with sharp discs and no vascular changes. Pupils are round equal and briskly reactive to light. CN III, IV, VI: extraocular movement are normal. No ptosis. CN V: Facial sensation is intact to pinprick in all 3 divisions bilaterally. Corneal responses are intact.  CN VII: Face is symmetric with normal eye closure and smile. CN VIII: Hearing is normal to rubbing fingers CN IX, X: Palate elevates symmetrically. Phonation is normal. CN XI: Head turning and shoulder shrug are intact CN XII: Tongue is midline with normal movements and no atrophy.  MOTOR: There is no pronator drift of out-stretched arms. Muscle bulk and tone are normal. Muscle strength is normal.  REFLEXES: Reflexes are 2+ and symmetric at the biceps, triceps, knees, and ankles. Plantar responses are flexor.  SENSORY: Intact to light touch, pinprick, positional sensation and vibratory sensation are intact in fingers and toes.  COORDINATION: Rapid alternating movements and fine finger movements are intact. There is no dysmetria on finger-to-nose and heel-knee-shin.    GAIT/STANCE: Posture is normal. Gait is steady with  normal steps, base, arm swing, and turning. Heel and toe walking are normal. Tandem gait is normal.  Romberg is absent.   DIAGNOSTIC DATA (LABS, IMAGING, TESTING) - I reviewed patient records, labs, notes, testing and imaging myself where available.   ASSESSMENT AND PLAN  Vicki Wright is a 57 y.o. female   Mild memory loss  Unsure etiology,  MRI of the brain   Laboratory evaluations to rule out treatable etiology    Levert Feinstein, M.D. Ph.D.  [facility] [address] [place], [place] [address] Ph: [phone])  [phone] Fax: [phone]  CC: Referring Provider

## 2017-06-07 LAB — C-REACTIVE PROTEIN: CRP: 1.9 mg/L (ref 0.0–4.9)

## 2017-06-07 LAB — SEDIMENTATION RATE: Sed Rate: 2 mm/hr (ref 0–40)

## 2017-06-07 LAB — VITAMIN B12: VITAMIN B 12: 555 pg/mL (ref 232–1245)

## 2017-06-07 LAB — RPR: RPR Ser Ql: NONREACTIVE

## 2017-06-07 LAB — ANA W/REFLEX: Anti Nuclear Antibody(ANA): NEGATIVE

## 2017-06-15 ENCOUNTER — Other Ambulatory Visit: Payer: Self-pay | Admitting: Physician Assistant

## 2017-06-15 ENCOUNTER — Encounter: Payer: Self-pay | Admitting: Physician Assistant

## 2017-06-15 DIAGNOSIS — E039 Hypothyroidism, unspecified: Secondary | ICD-10-CM

## 2017-06-15 MED ORDER — ALPRAZOLAM 0.5 MG PO TABS: ORAL_TABLET | ORAL | 0 refills | Status: DC

## 2017-06-16 ENCOUNTER — Other Ambulatory Visit: Payer: Self-pay | Admitting: Internal Medicine

## 2017-06-16 DIAGNOSIS — E785 Hyperlipidemia, unspecified: Secondary | ICD-10-CM

## 2017-06-25 ENCOUNTER — Ambulatory Visit
Admission: RE | Admit: 2017-06-25 | Discharge: 2017-06-25 | Disposition: A | Discharge status: Home / Self Care | Payer: 59 | Source: Ambulatory Visit | Attending: Neurology | Admitting: Neurology

## 2017-06-25 DIAGNOSIS — R413 Other amnesia: Secondary | ICD-10-CM | POA: Diagnosis not present

## 2017-06-25 MED ORDER — GADOBENATE DIMEGLUMINE 529 MG/ML IV SOLN
17.0000 mL | Freq: Once | INTRAVENOUS | Status: AC | PRN
  Administered 2017-06-25: 17 mL via INTRAVENOUS

## 2017-06-26 ENCOUNTER — Telehealth: Payer: Self-pay | Admitting: Neurology

## 2017-06-26 NOTE — Telephone Encounter (Signed)
Spoke to patient she is aware of results

## 2017-06-26 NOTE — Telephone Encounter (Signed)
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  No enhancement is noted.  There mild changes of chronic microvascular ischemia and chronic paranasal sinusitis.  Overall no significant change compared with MRI dated 02/26/2013.

## 2017-07-03 DIAGNOSIS — H40023 Open angle with borderline findings, high risk, bilateral: Secondary | ICD-10-CM | POA: Diagnosis not present

## 2017-08-07 ENCOUNTER — Other Ambulatory Visit: Payer: Self-pay | Admitting: Physician Assistant

## 2017-08-07 DIAGNOSIS — E039 Hypothyroidism, unspecified: Secondary | ICD-10-CM

## 2017-09-18 ENCOUNTER — Encounter: Payer: Self-pay | Admitting: Neurology

## 2017-09-18 ENCOUNTER — Ambulatory Visit: Payer: 59 | Admitting: Neurology

## 2017-09-18 VITALS — BP 128/78 | HR 70 | Ht 64.0 in | Wt 177.0 lb

## 2017-09-18 DIAGNOSIS — G3184 Mild cognitive impairment, so stated: Secondary | ICD-10-CM

## 2017-09-18 NOTE — Progress Notes (Signed)
PATIENT: Vicki Wright DOB: 02/16/1961  Chief Complaint  Patient presents with  . Memory Loss    She would like to review her MRI results today.  No change in memory.  Last MMSE on 06/06/17 was 29/30.     HISTORICAL  Vicki Wright is a 57 year old female, seen in refer by primary care PA collier, Estill Bamberg, for evaluation of abnormal MRI of the brain.  initial evaluation was on June 06, 2017.  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  In addition, she complains of intermittent headaches, couple times each week,  UPDATE September 18 2017: She continues to work without much trouble.  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  No enhancement is noted.  There mild changes of chronic microvascular ischemia and chronic paranasal sinusitis.  Overall no significant change compared with MRI dated 02/26/2013.  Lab showed normal or negative, ANA, CRP, ESR, RPR, B12.  REVIEW OF SYSTEMS: Full 14 system review of systems performed and notable only for memory loss  ALLERGIES: Allergies  Allergen Reactions  . Cephalexin     Throat swelling    HOME MEDICATIONS: Current Outpatient Medications  Medication Sig Dispense Refill  . ALPRAZolam (XANAX) 0.5 MG tablet 1-2 tablets before the procedure, do not drive 4 tablet 0  . Cholecalciferol (VITAMIN D3) 5000 UNITS TABS Take 5,000 Units by mouth daily.    Marland Kitchen levothyroxine (SYNTHROID, LEVOTHROID) 150 MCG tablet TAKE 1 TABLET (150 MCG  TOTAL) BY MOUTH DAILY. 90 tablet 0  . Multiple Vitamins-Minerals (HAIR/SKIN/NAILS PO) Take by mouth daily.    . Pyridoxine HCl (VITAMIN B6) 200 MG TABS Take by mouth daily.    . simvastatin (ZOCOR) 40 MG tablet TAKE 1 TABLET BY MOUTH EVERY DAY 90 tablet 0  . vitamin B-12 (CYANOCOBALAMIN) 100 MCG tablet Take 100 mcg by mouth daily.    . ranitidine (ZANTAC) 300 MG tablet Take 1 tablet (300 mg total) by mouth 2 (two) times daily. 60 tablet 1   No current facility-administered medications for this visit.     PAST MEDICAL HISTORY: Past Medical History:  Diagnosis Date  . Abnormal MRI   . Colon polyp   . Hot flashes   . Hypercholesteremia   . Hypothyroid   . Memory loss   . Osteoarthritis     PAST SURGICAL HISTORY: Past Surgical History:  Procedure Laterality Date  . CESAREAN SECTION  82 & 84  . VAGINAL HYSTERECTOMY  2002    FAMILY HISTORY: Family History  Problem Relation Age of Onset  . Cancer Mother   . Hypertension Mother   . Heart disease Mother   . Diabetes Mother   . Hypertension Father   . Heart disease Father   . Diabetes Father   . Cancer Maternal Aunt   . Cancer Paternal Aunt   . Cancer Maternal Aunt   . Cancer Paternal Aunt   . Cancer Paternal Aunt   . Cancer Paternal Aunt  SOCIAL HISTORY:  Social History   Socioeconomic History  . Marital status: Divorced    Spouse name: Not on file  . Number of children: 2  . Years of education: 15  . Highest education level: Not on file  Occupational History    Comment: [employer]  . Financial resource strain: Not on file  . Food insecurity:    Worry: Not on file    Inability: Not on file  . Transportation needs:    Medical: Not on file    Non-medical: Not on file  Tobacco Use  . Smoking status: Never Smoker  . Smokeless tobacco: Never Used  Substance and Sexual Activity  . Alcohol use: No  . Drug use: No  . Sexual activity: Not on file  Lifestyle  . Physical activity:     Days per week: Not on file    Minutes per session: Not on file  . Stress: Not on file  Relationships  . Social connections:    Talks on phone: Not on file    Gets together: Not on file    Attends religious service: Not on file    Active member of club or organization: Not on file    Attends meetings of clubs or organizations: Not on file    Relationship status: Not on file  . Intimate partner violence:    Fear of current or ex partner: Not on file    Emotionally abused: Not on file    Physically abused: Not on file    Forced sexual activity: Not on file  Other Topics Concern  . Not on file  Social History Narrative   Patient is single and her daughter and family live with her.   Patient is working full-time.   Patient has a high school education.   Patient has two children.   Patient is left-handed.   Patient does drink maybe one coke once a week.     PHYSICAL EXAM   Vitals:   09/18/17 1605  BP: 128/78  Pulse: 70  Weight: 177 lb (80.3 kg)  Height: _0  (1.626 m)    Not recorded      Body mass index is 30.38 kg/m.  PHYSICAL EXAMNIATION:  Gen: NAD, conversant, well nourised, obese, well groomed                     Cardiovascular: Regular rate rhythm, no peripheral edema, warm, nontender. Eyes: Conjunctivae clear without exudates or hemorrhage Neck: Supple, no carotid bruits. Pulmonary: Clear to auscultation bilaterally   NEUROLOGICAL EXAM:  MMSE - Mini Mental State Exam 06/06/2017  Orientation to time 5  Orientation to Place 5  Registration 2  Attention/ Calculation 5  Recall 3  Language- name 2 objects 2  Language- repeat 1  Language- follow 3 step command 3  Language- read & follow direction 1  Write a sentence 1  Copy design 1  Total score 29  Animal naming 11   CRANIAL NERVES: CN II: Visual fields are full to confrontation. Fundoscopic exam is normal with sharp discs and no vascular changes. Pupils are round equal and briskly reactive to  light. CN III, IV, VI: extraocular movement are normal. No ptosis. CN V: Facial sensation is intact to pinprick in all 3 divisions bilaterally. Corneal responses are intact.  CN VII: Face is symmetric with normal eye closure and smile. CN VIII: Hearing is normal to rubbing fingers CN IX, X: Palate elevates symmetrically. Phonation  is normal. CN XI: Head turning and shoulder shrug are intact CN XII: Tongue is midline with normal movements and no atrophy.  MOTOR: There is no pronator drift of out-stretched arms. Muscle bulk and tone are normal. Muscle strength is normal.  REFLEXES: Reflexes are 2+ and symmetric at the biceps, triceps, knees, and ankles. Plantar responses are flexor.  SENSORY: Intact to light touch, pinprick, positional sensation and vibratory sensation are intact in fingers and toes.  COORDINATION: Rapid alternating movements and fine finger movements are intact. There is no dysmetria on finger-to-nose and heel-knee-shin.    GAIT/STANCE: Posture is normal. Gait is steady with normal steps, base, arm swing, and turning. Heel and toe walking are normal. Tandem gait is normal.  Romberg is absent.   DIAGNOSTIC DATA (LABS, IMAGING, TESTING) - I reviewed patient records, labs, notes, testing and imaging myself where available.   ASSESSMENT AND PLAN  MELVA FAUX is a 57 y.o. female   Mild memory loss  Stable, no limitation in her daily function  MRI of the brain showed stable right superior medial thalamus lesion, no change compared to previous scan in 2014  Laboratory evaluations showed no  treatable etiology    Marcial Pacas, M.D. Ph.D.  [facility] [address] [place], [place] [address] Ph: [phone] Fax: [phone]  CC: Referring Provider

## 2017-09-28 ENCOUNTER — Other Ambulatory Visit: Payer: Self-pay | Admitting: Physician Assistant

## 2017-09-28 DIAGNOSIS — Z1231 Encounter for screening mammogram for malignant neoplasm of breast: Secondary | ICD-10-CM

## 2017-10-23 NOTE — Progress Notes (Signed)
FOLLOW UP  Assessment and Plan:   Mild memory disturbance MRI stable, lab workup unremarkable Dr. Terrace Arabia recommended no further interventions Monitor  Hypertension Well controlled with current medications  Monitor blood pressure at home; patient to call if consistently greater than 130/80 Continue DASH diet.   Reminder to go to the ER if any CP, SOB, nausea, dizziness, severe HA, changes vision/speech, left arm numbness and tingling and jaw pain.  Cholesterol Currently above goal; on max dose simvastatin; if significantly further elevated, switch agent to atorvastatin or rosuvastatin Continue low cholesterol diet and exercise.  Check lipid panel.   Prediabetes Continue diet and exercise.  Perform daily foot/skin check, notify office of any concerning changes.  Check A1C  Obesity with co morbidities Long discussion about weight loss, diet, and exercise Recommended diet heavy in fruits and veggies and low in animal meats, cheeses, and dairy products, appropriate calorie intake Discussed ideal weight for height and initial weight goal (175 lb) Will follow up in 3 months  Vitamin D Def Below goal at last visit;  Check Vit D level  Continue diet and meds as discussed. Further disposition pending results of labs. Discussed med's effects and SE's.   Over 30 minutes of exam, counseling, chart review, and critical decision making was performed.   Future Appointments  Date Time Provider Department Center  11/13/2017  3:30 PM [facility] 3 [facility]  04/18/2018  3:00 PM Quentin Mulling, PA-C [facility] None    ----------------------------------------------------------------------------------------------------------------------  HPI 57 y.o. female  presents for 3 month follow up on hypertension, cholesterol, prediabetes, obesity and vitamin D deficiency. 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 Laboratory evaluations showed no treatable etiology. Dr. Terrace Arabia (neurology) recommends monitoring only at this time.   BMI is Body mass index is 30.55 kg/m., she has been working on exercise (doing "walking off the pounds" program with her daughter, doing 3 days a week, ~30 min). Her daughter does most of the cooking, eating lots of fruits and vegetables, avoiding butter, using coconut oil. Doing lots of grilled/baked proteins. Using substitute pastas, cutting down on rice intake as well. Does a shake pack for lunch at work.  Wt Readings from Last 3 Encounters:  10/24/17 178 lb (80.7 kg)  09/18/17 177 lb (80.3 kg)  06/06/17 181 lb (82.1 kg)   Today their BP is BP: 120/64  She does workout. She denies chest pain, shortness of breath, dizziness.   She is on cholesterol medication (simvastatin 40 mg daily) and denies myalgias. Her cholesterol is not at goal. The cholesterol last visit was:   Lab Results  Component Value Date   CHOL 188 04/10/2017   HDL 44 (L) 04/10/2017   LDLCALC 116 (H) 04/10/2017   TRIG 161 (H) 04/10/2017   CHOLHDL 4.3 04/10/2017    She has been working on diet and exercise for prediabetes, and denies foot ulcerations, increased appetite, nausea, paresthesia of the feet, polydipsia, polyuria, visual disturbances, vomiting and weight loss. Last A1C in the office was:  Lab Results  Component Value Date   HGBA1C 6.0 (H) 04/10/2017   Patient is on Vitamin D supplement.   Lab Results  Component Value Date   VD25OH 34 04/10/2017        Current Medications:  Current Outpatient Medications on File Prior to Visit  Medication Sig  . ALPRAZolam (XANAX) 0.5 MG tablet 1-2 tablets before the  procedure, do not drive  . Cholecalciferol (VITAMIN D3) 5000 UNITS TABS Take 5,000 Units by mouth daily.  Marland Kitchen. levothyroxine (SYNTHROID, LEVOTHROID) 150 MCG tablet TAKE 1 TABLET (150 MCG TOTAL) BY MOUTH DAILY.  . Multiple Vitamins-Minerals (HAIR/SKIN/NAILS PO) Take by  mouth daily.  . Pyridoxine HCl (VITAMIN B6) 200 MG TABS Take by mouth daily.  . simvastatin (ZOCOR) 40 MG tablet TAKE 1 TABLET BY MOUTH EVERY DAY  . vitamin B-12 (CYANOCOBALAMIN) 100 MCG tablet Take 100 mcg by mouth daily.  . ranitidine (ZANTAC) 300 MG tablet Take 1 tablet (300 mg total) by mouth 2 (two) times daily.   No current facility-administered medications on file prior to visit.      Allergies:  Allergies  Allergen Reactions  . Cephalexin     Throat swelling     Medical History:  Past Medical History:  Diagnosis Date  . Abnormal MRI   . Colon polyp   . Hot flashes   . Hypercholesteremia   . Hypothyroid   . Memory loss   . Osteoarthritis    Family history- Reviewed and unchanged Social history- Reviewed and unchanged   Review of Systems:  Review of Systems  Constitutional: Negative for malaise/fatigue and weight loss.  HENT: Negative for hearing loss and tinnitus.   Eyes: Negative for blurred vision and double vision.  Respiratory: Negative for cough, shortness of breath and wheezing.   Cardiovascular: Negative for chest pain, palpitations, orthopnea, claudication and leg swelling.  Gastrointestinal: Negative for abdominal pain, blood in stool, constipation, diarrhea, heartburn, melena, nausea and vomiting.  Genitourinary: Negative.   Musculoskeletal: Negative for joint pain and myalgias.  Skin: Negative for rash.  Neurological: Negative for dizziness, tingling, sensory change, weakness and headaches.  Endo/Heme/Allergies: Negative for polydipsia.  Psychiatric/Behavioral: Positive for memory loss.  All other systems reviewed and are negative.    Physical Exam: BP 120/64   Pulse 72   Temp 97.7 F (36.5 C)   Ht 5\' 4"  (1.626 m)   Wt 178 lb (80.7 kg)   SpO2 97%   BMI 30.55 kg/m  Wt Readings from Last 3 Encounters:  10/24/17 178 lb (80.7 kg)  09/18/17 177 lb (80.3 kg)  06/06/17 181 lb (82.1 kg)   General Appearance: Well nourished, in no apparent  distress. Eyes: PERRLA, EOMs, conjunctiva no swelling or erythema Sinuses: No Frontal/maxillary tenderness ENT/Mouth: Ext aud canals clear, TMs without erythema, bulging. No erythema, swelling, or exudate on post pharynx.  Tonsils not swollen or erythematous. Hearing normal.  Neck: Supple, thyroid normal.  Respiratory: Respiratory effort normal, BS equal bilaterally without rales, rhonchi, wheezing or stridor.  Cardio: RRR with no MRGs. Brisk peripheral pulses without edema.  Abdomen: Soft, + BS.  Non tender, no guarding, rebound, hernias, masses. Lymphatics: Non tender without lymphadenopathy.  Musculoskeletal: Full ROM, 5/5 strength, Normal gait Skin: Warm, dry without rashes, lesions, ecchymosis.  Neuro: Cranial nerves intact. No cerebellar symptoms.  Psych: Awake and oriented X 3, normal affect, Insight and Judgment appropriate.    Dan MakerAshley C Barkley Kratochvil, NP 2:38 PM [facility]

## 2017-10-24 ENCOUNTER — Encounter: Payer: Self-pay | Admitting: Adult Health

## 2017-10-24 ENCOUNTER — Ambulatory Visit: Payer: 59 | Admitting: Adult Health

## 2017-10-24 VITALS — BP 120/64 | HR 72 | Temp 97.7°F | Ht 64.0 in | Wt 178.0 lb

## 2017-10-24 DIAGNOSIS — E785 Hyperlipidemia, unspecified: Secondary | ICD-10-CM

## 2017-10-24 DIAGNOSIS — R7303 Prediabetes: Secondary | ICD-10-CM

## 2017-10-24 DIAGNOSIS — I10 Essential (primary) hypertension: Secondary | ICD-10-CM

## 2017-10-24 DIAGNOSIS — Z79899 Other long term (current) drug therapy: Secondary | ICD-10-CM | POA: Diagnosis not present

## 2017-10-24 DIAGNOSIS — R413 Other amnesia: Secondary | ICD-10-CM

## 2017-10-24 DIAGNOSIS — E039 Hypothyroidism, unspecified: Secondary | ICD-10-CM | POA: Diagnosis not present

## 2017-10-24 DIAGNOSIS — E669 Obesity, unspecified: Secondary | ICD-10-CM

## 2017-10-24 DIAGNOSIS — E559 Vitamin D deficiency, unspecified: Secondary | ICD-10-CM

## 2017-10-24 NOTE — Patient Instructions (Signed)
Aim for 7+ servings of fruits and vegetables daily  80+ fluid ounces of water or unsweet tea for healthy kidneys  Limit alcohol intake  Limit animal fats in diet for cholesterol and heart health - choose grass fed whenever available  Aim for low stress - take time to unwind and care for your mental health  Aim for 150 min of moderate intensity exercise weekly for heart health, and weights twice weekly for bone health  Aim for 7-9 hours of sleep daily      When it comes to diets, agreement about the perfect plan isn't easy to find, even among the experts. Experts at the [facility] [employer] developed an idea known as the Healthy Eating Plate. Just imagine a plate divided into logical, healthy portions.  The emphasis is on diet quality:  Load up on vegetables and fruits - one-half of your plate: Aim for color and variety, and remember that potatoes don't count.  Go for whole grains - one-quarter of your plate: Whole wheat, barley, wheat berries, quinoa, oats, brown rice, and foods made with them. If you want pasta, go with whole wheat pasta.  Protein power - one-quarter of your plate: Fish, chicken, beans, and nuts are all healthy, versatile protein sources. Limit red meat.  The diet, however, does go beyond the plate, offering a few other suggestions.  Use healthy plant oils, such as olive, canola, soy, corn, sunflower and peanut. Check the labels, and avoid partially hydrogenated oil, which have unhealthy trans fats.  If you're thirsty, drink water. Coffee and tea are good in moderation, but skip sugary drinks and limit milk and dairy products to one or two daily servings.  The type of carbohydrate in the diet is more important than the amount. Some sources of carbohydrates, such as vegetables, fruits, whole grains, and beans-are healthier than others.  Finally, stay active.   8 Critical Weight-Loss Tips That Aren't Diet and Exercise  1. STARVE THE  DISTRACTIONS  All too often when we eat, we're also multitasking: watching TV, answering emails, scrolling through social media. These habits are detrimental to having a strong, clear, healthy relationship with food, and they can hinder our ability to make dietary changes.  In order to truly focus on what you're eating, how much you're eating, why you're eating those specific foods and, most importantly, how those foods make you feel, you need to starve the distractions. That means when you eat, just eat. Focus on your food, the process it went through to end up on your plate, where it came from and how it nourishes you. With this technique, you're more likely to finish a meal feeling satiated.  2.  CONSIDER WHAT YOU'RE NOT WILLING TO DO  This might sound counterintuitive, but it can help provide a "why" when motivation is waning. Declare, in writing, what you are unwilling to do, for example "I am unwilling to be the old dad who cannot play sports with my children".  So consider what you're not willing to accept, write it down, and keep it at the ready.  3.  STOP LABELING FOOD "GOOD" AND "BAD"  You've probably heard someone say they ate something "bad." Maybe you've even said it yourself.  The trouble with 'bad' foods isn't that they'll send you to the grave after a bite or two. The trouble comes when we eat excessive portions of really calorie-dense foods meal after meal, day after day.  Instead of labeling foods as good or bad,  think about which foods you can eat a lot of, and which ones you should just eat a little of. Then, plan ways to eat the foods you really like in portions that fit with your overall goals. A good example of this would be having a slice of pizza alongside a club salad with chicken breast, avocado and a bit of dressing. This is vastly different than 3 slices of pizza, 4 breadsticks with cheese sauce and half of a liter of regular soda.  4.  BRUSH YOUR TEETH AFTER YOU  EAT  Getting your mindset in order is important, but sometimes small habits can make a big difference. After eating, you still have the taste of food in their mouth, which often causes people to eat more even if they are full or engage in a nibble or two of dessert.  Brushing your teeth will remove the taste of food from your mouth, and the clean, minty freshness will serve as a cue that mealtime is over.  5.  FOCUS ON CROWDING NOT CUTTING  The most common first step during 'dieting' is to cut. We cut our portion sizes down, we cut out 'bad' foods, we cut out entire food groups. This act of cutting puts us and our minds into scarcity mode.  When something is off-limits, even if you're able to avoid it for a while, you could end up bingeing on it later because you've gone so long without it. So, instead of cutting, focus on crowding. If you crowd your plate and fill it up with more foods like veggies and protein, it simply allows less room for the other stuff. In other words, shift your focus away from what you can't eat, and celebrate the foods that will help you reach your goals.  6.  TAKE TRACKING A STEP FURTHER  Track what you eat, when you ate it, how much you ate and how that food made you feel. Being completely honest with yourself and writing down every single thing that passes through your lips will help you start to notice that maybe you actually do snack, possibly take in more sugar than you thought, eat when you're bored rather than just hungry or maybe that you have a habit of snacking before bed while watching TV.  The difference from simply tracking your food intake is you're taking into account how food makes you feel, as well as what you're doing while you're eating. This is about becoming more mindful of what, when and why you eat.  7.  PRIORITIZE GOOD SLEEP  One of the strongest risk factors for being overweight is poor sleep. When you're feeling tired, you're more likely to  choose unhealthy comfort foods and to skip your workout. Additionally, sleep deprivation may slow down your metabolism. Burnett KanarisYikes! Therefore, sleeping 7-8 hours per night can help with weight loss without having to change your diet or increase your physical activity. And if you feel you snore and still wake up tired, talk with me about sleep apnea.  8.  SET ASIDE TIME TO DISCONNECT  Just get out there. Disconnect from the electronics and connect to the elements. Not only will this help reduce stress (a major factor in weight gain) by giving your mind a break from the constant stimulation we've all become so accustomed to, but it may also reprogram your brain to connect with yourself and what you're feeling.

## 2017-10-25 LAB — LIPID PANEL
CHOLESTEROL: 178 mg/dL (ref <200)
HDL: 39 mg/dL — AB (ref >50)
LDL CHOLESTEROL (CALC): 96 mg/dL
Non-HDL Cholesterol (Calc): 139 mg/dL (calc) — ABNORMAL HIGH (ref <130)
TRIGLYCERIDES: 319 mg/dL — AB (ref <150)
Total CHOL/HDL Ratio: 4.6 (calc) (ref <5.0)

## 2017-10-25 LAB — COMPLETE METABOLIC PANEL WITH GFR
AG Ratio: 1.8 (calc) (ref 1.0–2.5)
ALT: 23 U/L (ref 6–29)
AST: 20 U/L (ref 10–35)
Albumin: 4.6 g/dL (ref 3.6–5.1)
Alkaline phosphatase (APISO): 50 U/L (ref 33–130)
BILIRUBIN TOTAL: 0.5 mg/dL (ref 0.2–1.2)
BUN/Creatinine Ratio: 9 (calc) (ref 6–22)
BUN: 10 mg/dL (ref 7–25)
CHLORIDE: 104 mmol/L (ref 98–110)
CO2: 30 mmol/L (ref 20–32)
Calcium: 10.1 mg/dL (ref 8.6–10.4)
Creat: 1.11 mg/dL — ABNORMAL HIGH (ref 0.50–1.05)
GFR, EST AFRICAN AMERICAN: 63 mL/min/{1.73_m2} (ref >=60)
GFR, Est Non African American: 55 mL/min/{1.73_m2} — ABNORMAL LOW (ref >=60)
GLUCOSE: 111 mg/dL — AB (ref 65–99)
Globulin: 2.5 g/dL (calc) (ref 1.9–3.7)
Potassium: 4.1 mmol/L (ref 3.5–5.3)
Sodium: 143 mmol/L (ref 135–146)
Total Protein: 7.1 g/dL (ref 6.1–8.1)

## 2017-10-25 LAB — CBC WITH DIFFERENTIAL/PLATELET
Basophils Absolute: 53 cells/uL (ref 0–200)
Basophils Relative: 0.7 %
EOS ABS: 68 {cells}/uL (ref 15–500)
Eosinophils Relative: 0.9 %
HCT: 36 % (ref 35.0–45.0)
Hemoglobin: 12.6 g/dL (ref 11.7–15.5)
Lymphs Abs: 3443 cells/uL (ref 850–3900)
MCH: 29.4 pg (ref 27.0–33.0)
MCHC: 35 g/dL (ref 32.0–36.0)
MCV: 83.9 fL (ref 80.0–100.0)
MONOS PCT: 5.9 %
MPV: 11.4 fL (ref 7.5–12.5)
Neutro Abs: 3587 cells/uL (ref 1500–7800)
Neutrophils Relative %: 47.2 %
PLATELETS: 297 10*3/uL (ref 140–400)
RBC: 4.29 10*6/uL (ref 3.80–5.10)
RDW: 13.5 % (ref 11.0–15.0)
TOTAL LYMPHOCYTE: 45.3 %
WBC mixed population: 448 cells/uL (ref 200–950)
WBC: 7.6 10*3/uL (ref 3.8–10.8)

## 2017-10-25 LAB — HEMOGLOBIN A1C
HEMOGLOBIN A1C: 5.8 %{Hb} — AB (ref <5.7)
MEAN PLASMA GLUCOSE: 120 (calc)
eAG (mmol/L): 6.6 (calc)

## 2017-10-25 LAB — VITAMIN D 25 HYDROXY (VIT D DEFICIENCY, FRACTURES): VIT D 25 HYDROXY: 29 ng/mL — AB (ref 30–100)

## 2017-10-25 LAB — TSH: TSH: 1.21 mIU/L (ref 0.40–4.50)

## 2017-10-27 ENCOUNTER — Encounter: Payer: Self-pay | Admitting: Adult Health

## 2017-11-13 ENCOUNTER — Ambulatory Visit
Admission: RE | Admit: 2017-11-13 | Discharge: 2017-11-13 | Disposition: A | Discharge status: Home / Self Care | Payer: 59 | Source: Ambulatory Visit | Attending: Physician Assistant | Admitting: Physician Assistant

## 2017-11-13 DIAGNOSIS — Z1231 Encounter for screening mammogram for malignant neoplasm of breast: Secondary | ICD-10-CM

## 2017-12-12 DIAGNOSIS — H2513 Age-related nuclear cataract, bilateral: Secondary | ICD-10-CM | POA: Diagnosis not present

## 2017-12-12 DIAGNOSIS — H40033 Anatomical narrow angle, bilateral: Secondary | ICD-10-CM | POA: Diagnosis not present

## 2017-12-12 DIAGNOSIS — H40013 Open angle with borderline findings, low risk, bilateral: Secondary | ICD-10-CM | POA: Diagnosis not present

## 2018-01-07 ENCOUNTER — Other Ambulatory Visit: Payer: Self-pay | Admitting: Internal Medicine

## 2018-01-07 DIAGNOSIS — E785 Hyperlipidemia, unspecified: Secondary | ICD-10-CM

## 2018-04-17 NOTE — Progress Notes (Signed)
Complete Physical  Assessment and Plan:  Screening, anemia, deficiency, iron -     Iron,Total/Total Iron Binding Cap  Shortness of breath -     EKG 12-Lead -     DG Chest 2 View; Future -     D-dimer, quantitative (not at [facility]) - Get CXR and labs, EKG WNL.  - with family history, chol, HTN will send for cardiovascular evaluation Add on bASA  if any increasing shortness of breath, swelling, or chest pressure go to ER immediately.  -     rosuvastatin (CRESTOR) 10 MG tablet; Take 1 tablet (10 mg total) by mouth at bedtime.  Chest pain, unspecified type -     Ambulatory referral to Cardiology -     rosuvastatin (CRESTOR) 10 MG tablet; Take 1 tablet (10 mg total) by mouth at bedtime.  Hypothyroidism, unspecified hypothyroidism type Hypothyroidism-check TSH level, continue medications the same, reminded to take on an empty stomach 30-81mins before food.  - TSH  Hyperlipidemia -continue medications, check lipids, decrease fatty foods, increase activity.  Add on crestor and stop the simvasatin - CBC with Differential/Platelet - BASIC METABOLIC PANEL WITH GFR - Hepatic function panel - Lipid panel - EKG 12-Lead  Prediabetes Continue weight loss Discussed general issues about diabetes pathophysiology and management.  Agricultural engineer distributed.  Encouraged aerobic exercise. - Hemoglobin A1c - Insulin, fasting  Osteopenia Will get at 63  Osteoarthritis, unspecified osteoarthritis type, unspecified site controlled   Colon polyp Repeat 2020- has OV   Medication management - Magnesium  Vitamin D deficiency - VITAMIN D 25 Hydroxy (Vit-D Deficiency, Fractures)   Screening for blood or protein in urine - Urinalysis, Routine w reflex microscopic (not at [facility]) - Microalbumin / creatinine urine ratio  Encounter for general adult medical examination with abnormal findings 1 year  Abnormal MRI of head Continue follow up neuro  BMI 30.0-30.9,adult - follow up 3  months for progress monitoring - increase veggies, decrease carbs - long discussion about weight loss, diet, and exercise   Discussed med's effects and SE's. Screening labs and tests as requested with regular follow-up as recommended. Over 40 minutes of exam, counseling, chart review, and complex, high level critical decision making was performed this visit.   HPI  58 y.o. AA female  presents for a complete physical.  She did have one episode around thanksgiving of left chest pain, left arm tingling at the time with dizziness, sweaty, no SOB, nausea. Nonexertional. Lasted 2-3 mins. She states if she is walking WITH SOMETHING in her hand that she is short of breath and will occ feel left chest pain. She had another episode new years eve she was done eating, standing int he kitchen and felt like she was going to pass out and had to sit down. No ETOH. Mother and father with family history. No personal or family history of blood clots, no estrogen.   Her blood pressure has been controlled at home, today their BP is BP: 136/74 She does not workout, due to weather and sister hurt her foot.  She denies chest pain, shortness of breath, dizziness.    She follows with Dr. Terrace Arabia- has had normal MMSE with her. She will have headaches that she describes as "muscle spasms", once or twice a month. Last for seconds, less than a minute, on her left side. No cause that she can think. No dizziness, nausea, hearing loss, denies increased stress, changes in vision, weakness, tremors. Had negative work up June 2017, normal B12, normal  ferritin, negative HIV, RPR, she had a positive ANA but negative antiDNA. No tick exposure. She sleeps well, is not under stress.   She is on cholesterol medication and denies myalgias. Her cholesterol is at goal. The cholesterol last visit was:   Lab Results  Component Value Date   CHOL 178 10/24/2017   HDL 39 (L) 10/24/2017   LDLCALC 96 10/24/2017   TRIG 319 (H) 10/24/2017   CHOLHDL  4.6 10/24/2017   She has been working on diet and exercise for prediabetes, and denies paresthesia of the feet, polydipsia, polyuria and visual disturbances. Last A1C in the office was:  Lab Results  Component Value Date   HGBA1C 5.8 (H) 10/24/2017   Last GFR: Lab Results  Component Value Date   GFRAA 63 10/24/2017   Patient is on Vitamin D supplement, she is on 500 units and this has not changed.   Lab Results  Component Value Date   VD25OH 29 (L) 10/24/2017     She is on thyroid medication. Her medication was not changed last visit, she is on 1 pill Monday- Thursday, none on F,S,S.  Lab Results  Component Value Date   TSH 1.21 10/24/2017   BMI is Body mass index is 30.35 kg/m., she is working on diet and exercise. Wt Readings from Last 3 Encounters:  04/18/18 182 lb 6.4 oz (82.7 kg)  10/24/17 178 lb (80.7 kg)  09/18/17 177 lb (80.3 kg)    Current Medications:  Current Outpatient Medications on File Prior to Visit  Medication Sig Dispense Refill  . ALPRAZolam (XANAX) 0.5 MG tablet 1-2 tablets before the procedure, do not drive 4 tablet 0  . Cholecalciferol (VITAMIN D3) 5000 UNITS TABS Take 5,000 Units by mouth daily.    Marland Kitchen. levothyroxine (SYNTHROID, LEVOTHROID) 150 MCG tablet TAKE 1 TABLET (150 MCG TOTAL) BY MOUTH DAILY. 90 tablet 0  . Multiple Vitamins-Minerals (HAIR/SKIN/NAILS PO) Take by mouth daily.    . Pyridoxine HCl (VITAMIN B6) 200 MG TABS Take by mouth daily.    . vitamin B-12 (CYANOCOBALAMIN) 100 MCG tablet Take 100 mcg by mouth daily.    . ranitidine (ZANTAC) 300 MG tablet Take 1 tablet (300 mg total) by mouth 2 (two) times daily. 60 tablet 1   No current facility-administered medications on file prior to visit.    Health Maintenance:   Immunization History  Administered Date(s) Administered  . PPD Test 03/13/2014  . Tdap 04/11/2006, 04/10/2017   TDAP: 2018 Pneumovax: N/A Prevnar 13: N/A Flu vaccine: declines Zostavax: N/A  LMP: Hysterectomy Pap:  2016 MGM: 11/2017 DEXA: 11/2016 osteopenia- will repeat this year Colonoscopy: 11/2013, repeat 2020 EGD: N/A MRI brain 2004/2014- abnormal but unchanged  Patient Care Team: Quentin Mullingollier, Gabriele Zwilling, PA-C as PCP - General (Physician Assistant) Francene BoyersBernstorf, Steven W, MD as Referring Physician (Optometry) Rollene RotundaHochrein, James, MD as Consulting Physician (Cardiology) Charna ElizabethMann, Jyothi, MD as Consulting Physician (Gastroenterology) Marcene Corningalldorf, Peter, MD as Consulting Physician (Orthopedic Surgery)  Medical History:  Past Medical History:  Diagnosis Date  . Abnormal MRI   . Colon polyp   . Hot flashes   . Hypercholesteremia   . Hypothyroid   . Memory loss   . Osteoarthritis    Allergies Allergies  Allergen Reactions  . Cephalexin     Throat swelling    SURGICAL HISTORY She  has a past surgical history that includes Cesarean section (80 & 82) and Vaginal hysterectomy (2002). FAMILY HISTORY Her family history includes Cancer in her maternal aunt, maternal aunt, mother, paternal  aunt, paternal aunt, paternal aunt, and paternal aunt; Diabetes in her father and mother; Heart disease in her father and mother; Hypertension in her father and mother. SOCIAL HISTORY She  reports that she has never smoked. She has never used smokeless tobacco. She reports that she does not drink alcohol or use drugs.  Review of Systems: Review of Systems  Constitutional: Positive for diaphoresis and malaise/fatigue.  HENT: Negative.   Eyes: Negative.   Respiratory: Positive for shortness of breath. Negative for cough, hemoptysis, sputum production and wheezing.   Cardiovascular: Positive for chest pain and leg swelling. Negative for palpitations, orthopnea, claudication and PND.  Gastrointestinal: Negative.   Genitourinary: Negative.   Musculoskeletal: Negative.   Skin: Negative.   Neurological: Negative.   Endo/Heme/Allergies: Negative.   Psychiatric/Behavioral: Negative.     Physical Exam: Estimated body mass index  is 30.35 kg/m as calculated from the following:   Height as of this encounter: 5\' 5"  (1.651 m).   Weight as of this encounter: 182 lb 6.4 oz (82.7 kg). BP 136/74   Pulse 83   Temp 98 F (36.7 C)   Ht 5\' 5"  (1.651 m)   Wt 182 lb 6.4 oz (82.7 kg)   SpO2 98%   BMI 30.35 kg/m  General Appearance: Well nourished, in no apparent distress.  Eyes: PERRLA, EOMs, conjunctiva no swelling or erythema, normal fundi and vessels.  Sinuses: No Frontal/maxillary tenderness  ENT/Mouth: Ext aud canals clear, normal light reflex with TMs without erythema, bulging. Good dentition. No erythema, swelling, or exudate on post pharynx. Tonsils not swollen or erythematous. Hearing normal.  Neck: Supple, thyroid normal. No bruits  Respiratory: Respiratory effort normal, BS equal bilaterally without rales, rhonchi, wheezing or stridor.  Cardio: RRR without murmurs, rubs or gallops. Brisk peripheral pulses without edema.  Chest: symmetric, with normal excursions and percussion.  Breasts: Symmetric, without lumps, nipple discharge, retractions.  Abdomen: Soft, nontender, no guarding, rebound, hernias, masses, or organomegaly.  Lymphatics: Non tender without lymphadenopathy.  Genitourinary: defer Musculoskeletal: Full ROM all peripheral extremities,5/5 strength, and normal gait.  Skin: Warm, dry without rashes, lesions, ecchymosis. Neuro: Cranial nerves intact, reflexes equal bilaterally. Normal muscle tone, no cerebellar symptoms. Sensation intact.  Psych: Awake and oriented X 3, normal affect, Insight and Judgment appropriate.   EKG: WNL no ST changes. AORTA SCAN: defer  Quentin Mulling 5:02 PM [facility]

## 2018-04-18 ENCOUNTER — Ambulatory Visit: Payer: 59 | Admitting: Physician Assistant

## 2018-04-18 ENCOUNTER — Encounter: Payer: Self-pay | Admitting: Physician Assistant

## 2018-04-18 VITALS — BP 136/74 | HR 83 | Temp 98.0°F | Ht 65.0 in | Wt 182.4 lb

## 2018-04-18 DIAGNOSIS — K635 Polyp of colon: Secondary | ICD-10-CM

## 2018-04-18 DIAGNOSIS — E039 Hypothyroidism, unspecified: Secondary | ICD-10-CM

## 2018-04-18 DIAGNOSIS — R0602 Shortness of breath: Secondary | ICD-10-CM

## 2018-04-18 DIAGNOSIS — E669 Obesity, unspecified: Secondary | ICD-10-CM

## 2018-04-18 DIAGNOSIS — Z136 Encounter for screening for cardiovascular disorders: Secondary | ICD-10-CM

## 2018-04-18 DIAGNOSIS — M8580 Other specified disorders of bone density and structure, unspecified site: Secondary | ICD-10-CM

## 2018-04-18 DIAGNOSIS — Z79899 Other long term (current) drug therapy: Secondary | ICD-10-CM

## 2018-04-18 DIAGNOSIS — R930 Abnormal findings on diagnostic imaging of skull and head, not elsewhere classified: Secondary | ICD-10-CM

## 2018-04-18 DIAGNOSIS — R079 Chest pain, unspecified: Secondary | ICD-10-CM

## 2018-04-18 DIAGNOSIS — R7303 Prediabetes: Secondary | ICD-10-CM

## 2018-04-18 DIAGNOSIS — I10 Essential (primary) hypertension: Secondary | ICD-10-CM

## 2018-04-18 DIAGNOSIS — Z130 Encounter for screening for diseases of the blood and blood-forming organs and certain disorders involving the immune mechanism: Secondary | ICD-10-CM

## 2018-04-18 DIAGNOSIS — Z0000 Encounter for general adult medical examination without abnormal findings: Secondary | ICD-10-CM

## 2018-04-18 DIAGNOSIS — M1990 Unspecified osteoarthritis, unspecified site: Secondary | ICD-10-CM

## 2018-04-18 DIAGNOSIS — R413 Other amnesia: Secondary | ICD-10-CM

## 2018-04-18 DIAGNOSIS — E785 Hyperlipidemia, unspecified: Secondary | ICD-10-CM | POA: Diagnosis not present

## 2018-04-18 DIAGNOSIS — E559 Vitamin D deficiency, unspecified: Secondary | ICD-10-CM

## 2018-04-18 MED ORDER — ROSUVASTATIN CALCIUM 10 MG PO TABS: 10.0000 mg | ORAL_TABLET | Freq: Every day | ORAL | 11 refills | Status: DC

## 2018-04-18 NOTE — Patient Instructions (Addendum)
VITAMIN D IS IMPORTANT  Vitamin D goal is between 60-80  Please make sure that you are taking your Vitamin D as directed.   It is very important as a natural anti-inflammatory   helping hair, skin, and nails, as well as reducing stroke and heart attack risk.   It helps your bones and helps with mood.  We want you on at least 5000 IU daily  It also decreases numerous cancer risks so please take it as directed.   Low Vit D is associated with a 200-300% higher risk for CANCER   and 200-300% higher risk for HEART   ATTACK  &  STROKE.    .....................................Marland Kitchen.  It is also associated with higher death rate at younger ages,   autoimmune diseases like Rheumatoid arthritis, Lupus, Multiple Sclerosis.     Also many other serious conditions, like depression, Alzheimer's  Dementia, infertility, muscle aches, fatigue, fibromyalgia - just to name a few.  +++++++++++++++++++  Can get liquid vitamin D from [place]  OR [place] at  [facility] [address], [place], [place] [id number] Or you can try earth [facility]   Make your colonoscopy appointment  Add BASA Stop the simvastatin and start the crestor  WOMEN AND HEART ATTACKS  Being a woman you may not have the typical symptoms of a heart attack.  You may not have any pain OR you may have atypical pain such as jaw pain, upper back pain, arm pain, "my bra feels to tight" and you will often have symptoms with it like below.  Symptoms for a heart attack will likely occur when you exert your self or exercise and include: Shortness of breath Sweating Nausea Dizziness Fast or irregular heart beats Fatigue   It makes me feel better if my patients get their heart rate up with exercise once or twice a week and pay close attention to your body. If there is ANY change in your exercise capacity or if you have symptoms above, please STOP and call 911 or call to come to the office.   Here is some information to help  you keep your heart healthy: Move it! - Aim for 30 mins of activity every day. Take it slowly at first. Talk to us before starting any new exercise program.   Lose it.  -Body Mass Index (BMI) can indicate if you need to lose weight. A healthy range is 18.5-24.9. For a BMI calculator, go to [employer]  Waist Management -Excess abdominal fat is a risk factor for heart disease, diabetes, asthma, stroke and more. Ideal waist circumference is less than 35" for women and less than 40" for men.   Eat Right -focus on fruits, vegetables, whole grains, and meals you make yourself. Avoid foods with trans fat and high sugar/sodium content.   Snooze or Snore? - Loud snoring can be a sign of sleep apnea, a significant risk factor for high blood pressure, heart attach, stroke, and heart arrhythmias.  Kick the habit -Quit Smoking! Avoid second hand smoke. A single cigarette raises your blood pressure for 20 mins and increases the risk of heart attack and stroke for the next 24 hours.   Are Aspirin and Supplements right for you? -Add ENTERIC COATED low dose 81 mg Aspirin daily OR can do every other day if you have easy bruising to protect your heart and head. As well as to reduce risk of Colon Cancer by 20 %, Skin Cancer by 26 % , Melanoma by 46% and Pancreatic cancer  by 60%  Say "No to Stress -There may be little you can do about problems that cause stress. However, techniques such as long walks, meditation, and exercise can help you manage it.   Start Now! - Make changes one at a time and set reasonable goals to increase your likelihood of success.

## 2018-04-19 LAB — URINALYSIS, ROUTINE W REFLEX MICROSCOPIC
Bacteria, UA: NONE SEEN /HPF
Bilirubin Urine: NEGATIVE
Glucose, UA: NEGATIVE
Hgb urine dipstick: NEGATIVE
Hyaline Cast: NONE SEEN /LPF
Ketones, ur: NEGATIVE
Nitrite: NEGATIVE
PH: 5.5 (ref 5.0–8.0)
Protein, ur: NEGATIVE
RBC / HPF: NONE SEEN /HPF (ref 0–2)
SPECIFIC GRAVITY, URINE: 1.014 (ref 1.001–1.03)
Squamous Epithelial / HPF: NONE SEEN /HPF (ref <=5)

## 2018-04-19 LAB — CBC WITH DIFFERENTIAL/PLATELET
Absolute Monocytes: 598 cells/uL (ref 200–950)
BASOS ABS: 50 {cells}/uL (ref 0–200)
BASOS PCT: 0.6 %
EOS PCT: 0.6 %
Eosinophils Absolute: 50 cells/uL (ref 15–500)
HCT: 37.8 % (ref 35.0–45.0)
HEMOGLOBIN: 12.8 g/dL (ref 11.7–15.5)
Lymphs Abs: 3893 cells/uL (ref 850–3900)
MCH: 29 pg (ref 27.0–33.0)
MCHC: 33.9 g/dL (ref 32.0–36.0)
MCV: 85.7 fL (ref 80.0–100.0)
MONOS PCT: 7.2 %
MPV: 11 fL (ref 7.5–12.5)
NEUTROS ABS: 3710 {cells}/uL (ref 1500–7800)
Neutrophils Relative %: 44.7 %
Platelets: 300 10*3/uL (ref 140–400)
RBC: 4.41 10*6/uL (ref 3.80–5.10)
RDW: 13.2 % (ref 11.0–15.0)
Total Lymphocyte: 46.9 %
WBC: 8.3 10*3/uL (ref 3.8–10.8)

## 2018-04-19 LAB — COMPLETE METABOLIC PANEL WITH GFR
AG Ratio: 1.8 (calc) (ref 1.0–2.5)
ALT: 30 U/L — AB (ref 6–29)
AST: 22 U/L (ref 10–35)
Albumin: 4.9 g/dL (ref 3.6–5.1)
Alkaline phosphatase (APISO): 50 U/L (ref 33–130)
BUN/Creatinine Ratio: 10 (calc) (ref 6–22)
BUN: 12 mg/dL (ref 7–25)
CALCIUM: 10 mg/dL (ref 8.6–10.4)
CO2: 31 mmol/L (ref 20–32)
Chloride: 101 mmol/L (ref 98–110)
Creat: 1.15 mg/dL — ABNORMAL HIGH (ref 0.50–1.05)
GFR, EST NON AFRICAN AMERICAN: 52 mL/min/{1.73_m2} — AB (ref >=60)
GFR, Est African American: 60 mL/min/{1.73_m2} (ref >=60)
Globulin: 2.7 g/dL (calc) (ref 1.9–3.7)
Glucose, Bld: 88 mg/dL (ref 65–99)
POTASSIUM: 4.3 mmol/L (ref 3.5–5.3)
Sodium: 141 mmol/L (ref 135–146)
Total Bilirubin: 0.6 mg/dL (ref 0.2–1.2)
Total Protein: 7.6 g/dL (ref 6.1–8.1)

## 2018-04-19 LAB — MAGNESIUM: MAGNESIUM: 2 mg/dL (ref 1.5–2.5)

## 2018-04-19 LAB — MICROALBUMIN / CREATININE URINE RATIO
Creatinine, Urine: 113 mg/dL (ref 20–275)
Microalb Creat Ratio: 6 mcg/mg creat (ref <30)
Microalb, Ur: 0.7 mg/dL

## 2018-04-19 LAB — HEMOGLOBIN A1C
EAG (MMOL/L): 7.1 (calc)
Hgb A1c MFr Bld: 6.1 % of total Hgb — ABNORMAL HIGH (ref <5.7)
Mean Plasma Glucose: 128 (calc)

## 2018-04-19 LAB — LIPID PANEL
CHOLESTEROL: 238 mg/dL — AB (ref <200)
HDL: 46 mg/dL — ABNORMAL LOW (ref >50)
LDL Cholesterol (Calc): 157 mg/dL (calc) — ABNORMAL HIGH
Non-HDL Cholesterol (Calc): 192 mg/dL (calc) — ABNORMAL HIGH (ref <130)
Total CHOL/HDL Ratio: 5.2 (calc) — ABNORMAL HIGH (ref <5.0)
Triglycerides: 191 mg/dL — ABNORMAL HIGH (ref <150)

## 2018-04-19 LAB — D-DIMER, QUANTITATIVE: D-Dimer, Quant: 0.32 mcg/mL FEU (ref <0.50)

## 2018-04-19 LAB — TSH: TSH: 2.7 mIU/L (ref 0.40–4.50)

## 2018-04-19 LAB — VITAMIN D 25 HYDROXY (VIT D DEFICIENCY, FRACTURES): Vit D, 25-Hydroxy: 42 ng/mL (ref 30–100)

## 2018-04-19 LAB — IRON, TOTAL/TOTAL IRON BINDING CAP
%SAT: 31 % (calc) (ref 16–45)
IRON: 91 ug/dL (ref 45–160)
TIBC: 298 mcg/dL (calc) (ref 250–450)

## 2018-04-20 ENCOUNTER — Ambulatory Visit (HOSPITAL_COMMUNITY)
Admission: RE | Admit: 2018-04-20 | Discharge: 2018-04-20 | Disposition: A | Discharge status: Home / Self Care | Payer: 59 | Source: Ambulatory Visit | Attending: Physician Assistant | Admitting: Physician Assistant

## 2018-04-20 ENCOUNTER — Encounter (HOSPITAL_COMMUNITY): Payer: Self-pay

## 2018-04-20 DIAGNOSIS — R0602 Shortness of breath: Secondary | ICD-10-CM | POA: Insufficient documentation

## 2018-04-30 ENCOUNTER — Ambulatory Visit (HOSPITAL_COMMUNITY)
Admission: RE | Admit: 2018-04-30 | Discharge: 2018-04-30 | Disposition: A | Discharge status: Home / Self Care | Payer: 59 | Source: Ambulatory Visit | Attending: Physician Assistant | Admitting: Physician Assistant

## 2018-04-30 DIAGNOSIS — R0602 Shortness of breath: Secondary | ICD-10-CM | POA: Insufficient documentation

## 2018-05-01 DIAGNOSIS — R635 Abnormal weight gain: Secondary | ICD-10-CM | POA: Diagnosis not present

## 2018-05-01 DIAGNOSIS — Z1211 Encounter for screening for malignant neoplasm of colon: Secondary | ICD-10-CM | POA: Diagnosis not present

## 2018-05-01 DIAGNOSIS — K5904 Chronic idiopathic constipation: Secondary | ICD-10-CM | POA: Diagnosis not present

## 2018-06-09 ENCOUNTER — Other Ambulatory Visit: Payer: Self-pay | Admitting: Physician Assistant

## 2018-06-09 DIAGNOSIS — E039 Hypothyroidism, unspecified: Secondary | ICD-10-CM

## 2018-06-18 DIAGNOSIS — Z800 Family history of malignant neoplasm of digestive organs: Secondary | ICD-10-CM | POA: Diagnosis not present

## 2018-06-18 DIAGNOSIS — Z1211 Encounter for screening for malignant neoplasm of colon: Secondary | ICD-10-CM | POA: Diagnosis not present

## 2018-06-18 LAB — HM COLONOSCOPY

## 2018-06-21 DIAGNOSIS — H25013 Cortical age-related cataract, bilateral: Secondary | ICD-10-CM | POA: Diagnosis not present

## 2018-06-21 DIAGNOSIS — H40033 Anatomical narrow angle, bilateral: Secondary | ICD-10-CM | POA: Diagnosis not present

## 2018-06-21 DIAGNOSIS — H40023 Open angle with borderline findings, high risk, bilateral: Secondary | ICD-10-CM | POA: Diagnosis not present

## 2018-07-17 ENCOUNTER — Telehealth: Payer: 59 | Admitting: Adult Health Nurse Practitioner

## 2018-07-17 ENCOUNTER — Encounter: Payer: Self-pay | Admitting: Adult Health Nurse Practitioner

## 2018-07-17 DIAGNOSIS — E785 Hyperlipidemia, unspecified: Secondary | ICD-10-CM | POA: Diagnosis not present

## 2018-07-17 DIAGNOSIS — R140 Abdominal distension (gaseous): Secondary | ICD-10-CM

## 2018-07-17 DIAGNOSIS — M79641 Pain in right hand: Secondary | ICD-10-CM | POA: Diagnosis not present

## 2018-07-17 DIAGNOSIS — M8580 Other specified disorders of bone density and structure, unspecified site: Secondary | ICD-10-CM

## 2018-07-17 DIAGNOSIS — Z79899 Other long term (current) drug therapy: Secondary | ICD-10-CM

## 2018-07-17 MED ORDER — ATORVASTATIN CALCIUM 40 MG PO TABS: 40.0000 mg | ORAL_TABLET | Freq: Every day | ORAL | 11 refills | Status: DC

## 2018-07-17 MED ORDER — PREDNISONE 10 MG (21) PO TBPK: ORAL_TABLET | Freq: Every day | ORAL | 0 refills | Status: DC

## 2018-07-17 NOTE — Progress Notes (Signed)
Virtual Visit via Telephone Note  I connected with Vicki Wright on 07/17/18 at 14:10PM at by telephone and verified that I am speaking with the correct person using two identifiers.   I discussed the limitations, risks, security and privacy concerns of performing an evaluation and management service by telephone and the availability of in person appointments. I also discussed with the patient that there may be a patient responsible charge related to this service. The patient expressed understanding and agreed to proceed.   History of Present Illness: Contact today was regarding concern of her Crestor medication.  She reports that since she started this medication one month ago she has gained weight and felt like there is a bunch of air in her stomach. She is more constipated now and would like to change medications.  She reports that she hit her right hand on the back of a chair about two weeks ago.  She was swinging her arms and hit her middle and ring finger.  She did not do any treatments for this at home.  She has some tingling in the right pinky and ring finger, mainly upon waking in the morning and this resolves once she is up and moving for the day.  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  She has been wearing a brace on her right hand and she has to use this for work.  She reports a 5/10 pain when she grips items and uses this hand.  She denies weakness or dropping of any objects.  Reports some swelling and slightly warm to touch on the palm of her hand.  She denies any wrist swelling, discoloration or open areas on her hand.    Observations/Objective: She is alert and oriented and able to carry on a conversation.  Her speech is fluent and able to answer all interview questions appropriately.  She reports full ROM to right wrist and fingers.  Discomfort with flexion and extension of middle finger, ring finger  & pinky finger on the right hand.  Assessment and Plan: Diagnoses and all orders for this visit:  Right hand pain Take Ibuprofen (Advil, Motrin) 600mg  every 6 hours or 800mg  every 8 hours for next 4 days.  Take this with food IF no improvement pick up Prednisone. -     predniSONE (STERAPRED UNI-PAK 21 TAB) 10 MG (21) TBPK tablet; Take by mouth daily. Follow directions on Taper pack.  Take with food. If no improvement consider imaging, hx of osteopenia.  Hyperlipidemia, unspecified hyperlipidemia type STOP Crestor -     atorvastatin (LIPITOR) 40 MG tablet; Take 1 tablet (40 mg total) by mouth daily. Take at bedtime Discussed medication and side effects  Abdominal bloating Related to Crestor? Changing medication Consider probiotic if no change after medication adjustment  Osteopenia, unspecified location Continue calcium and Vitamin D  Medication management    Follow Up Instructions:    I discussed the assessment and treatment plan with the patient. The patient was provided an opportunity to ask questions and all were answered. The patient agreed with the plan and demonstrated an understanding of the instructions.   The patient was advised to call back or seek an in-person evaluation if the symptoms worsen or if the condition fails to improve as anticipated.  I provided minutes of non-face-to-face time during this encounter.  Elder Negus, NP [facility] 07/17/2018  1:41 PM

## 2018-07-17 NOTE — Patient Instructions (Addendum)
Today you had a telephone visit with Vicki Negus, DNP.  Below is a summary of your visit.   Right hand pain/strain: Take Ibuprofen (Advil, Motrin) 600mg  every 6 hours OR 800mg  every 8 hours for next 4 days.   *Take this with food  IF no improvement pick up Prednisone. -     predniSONE (STERAPRED UNI-PAK 21 TAB) 10 MG (21) TBPK tablet; Take by mouth daily. Follow directions on Taper pack.  Take with food.  If you have new or worsening symptoms please contact the office. -Severe pain -Numbness & tingling that does not change with position changes -Weakness or if you begin to drop items.    *If there is not improvement we should discuss getting an X-ray of your hand.  You do have osteopenia, weakening of the bones, and we would be concerned about potential fracture.   Cholesterol Medication  STOP taking Crestor (Rosuvastatin) 10mg  We have sent in Lipitor (Atorvastatin) 40mg  tablet.    This is not as strong as the Crestor which is why the dose is higher.   Take this medication at bedtime.  If changing he medication does not help with the bloating try an      over the counter probiotic supplement.   These help with gas bloating and constipation. Increase your water intake, at least 80oz of water daily.   Be mindful of the surfaces you touch Do not touch your face Wash your hand frequently, especially prior to eating or handing food. Below is general information about COVID-19.  Coronavirus (COVID-19) Are you at risk?  Are you at risk for the Coronavirus (COVID-19)?  To be considered HIGH RISK for Coronavirus (COVID-19), you have to meet the following criteria:  . Traveled to [place], [place], [place], [place] or [place]; or in the [place] to [place], [place], [place], or [place]; and have fever, cough, and shortness of breath within the last 2 weeks of travel OR . Been in close contact with a person diagnosed with COVID-19 within the last 2 weeks and have fever,  cough, and shortness of breath . IF YOU DO NOT MEET THESE CRITERIA, YOU ARE CONSIDERED LOW RISK FOR COVID-19.  What to do if you are HIGH RISK for COVID-19?  Marland Kitchen If you are having a medical emergency, call 911. . Seek medical care right away. Before you go to a doctor's office, urgent care or emergency department, call ahead and tell them about your recent travel, contact with someone diagnosed with COVID-19, and your symptoms. You should receive instructions from your physician's office regarding next steps of care.  . When you arrive at healthcare provider, tell the healthcare staff immediately you have returned from visiting [place], [place], [place], [place] or [place]; or traveled in the [place] to [place], [place], [place], or [place]; in the last two weeks or you have been in close contact with a person diagnosed with COVID-19 in the last 2 weeks.   . Tell the health care staff about your symptoms: fever, cough and shortness of breath. . After you have been seen by a medical provider, you will be either: o Tested for (COVID-19) and discharged home on quarantine except to seek medical care if symptoms worsen, and asked to  - Stay home and avoid contact with others until you get your results (4-5 days)  - Avoid travel on public transportation if possible (such as bus, train, or airplane) or o Sent to the Emergency Department by  EMS for evaluation, COVID-19 testing, and possible admission depending on your condition and test results.  What to do if you are LOW RISK for COVID-19?  Reduce your risk of any infection by using the same precautions used for avoiding the common cold or flu:  Marland Kitchen. Wash your hands often with soap and warm water for at least 20 seconds.  If soap and water are not readily available, use an alcohol-based hand sanitizer with at least 60% alcohol.  . If coughing or sneezing, cover your mouth and nose by coughing or sneezing into the elbow areas of your shirt or coat,  into a tissue or into your sleeve (not your hands). . Avoid shaking hands with others and consider head nods or verbal greetings only. . Avoid touching your eyes, nose, or mouth with unwashed hands.  . Avoid close contact with people who are sick. . Avoid places or events with large numbers of people in one location, like concerts or sporting events. . Carefully consider travel plans you have or are making. . If you are planning any travel outside or inside the [place], visit the CDC's Travelers' Health webpage for the latest health notices. . If you have some symptoms but not all symptoms, continue to monitor at home and seek medical attention if your symptoms worsen. . If you are having a medical emergency, call 911.   ADDITIONAL HEALTHCARE OPTIONS FOR PATIENTS  [place] [facility] / e-Visit: [email or web address]         [facility]: [phone]  [facility]: [phone]                   [facility]: [phone]

## 2018-08-16 DIAGNOSIS — M79641 Pain in right hand: Secondary | ICD-10-CM

## 2018-08-16 DIAGNOSIS — M8580 Other specified disorders of bone density and structure, unspecified site: Secondary | ICD-10-CM

## 2018-08-21 ENCOUNTER — Other Ambulatory Visit: Payer: Self-pay

## 2018-08-21 ENCOUNTER — Ambulatory Visit
Admission: RE | Admit: 2018-08-21 | Discharge: 2018-08-21 | Disposition: A | Discharge status: Home / Self Care | Payer: 59 | Source: Ambulatory Visit | Attending: Adult Health Nurse Practitioner | Admitting: Adult Health Nurse Practitioner

## 2018-08-21 DIAGNOSIS — M79641 Pain in right hand: Secondary | ICD-10-CM

## 2018-08-21 DIAGNOSIS — M8580 Other specified disorders of bone density and structure, unspecified site: Secondary | ICD-10-CM

## 2018-08-22 DIAGNOSIS — M79641 Pain in right hand: Secondary | ICD-10-CM

## 2018-08-23 MED ORDER — PREDNISONE 10 MG PO TABS: ORAL_TABLET | ORAL | 0 refills | Status: DC

## 2018-11-13 ENCOUNTER — Other Ambulatory Visit: Payer: Self-pay | Admitting: Physician Assistant

## 2018-11-13 DIAGNOSIS — Z1231 Encounter for screening mammogram for malignant neoplasm of breast: Secondary | ICD-10-CM

## 2018-11-15 ENCOUNTER — Other Ambulatory Visit: Payer: Self-pay

## 2018-11-15 ENCOUNTER — Ambulatory Visit
Admission: RE | Admit: 2018-11-15 | Discharge: 2018-11-15 | Disposition: A | Discharge status: Home / Self Care | Payer: 59 | Source: Ambulatory Visit

## 2018-11-15 DIAGNOSIS — Z1231 Encounter for screening mammogram for malignant neoplasm of breast: Secondary | ICD-10-CM

## 2018-11-20 ENCOUNTER — Other Ambulatory Visit: Payer: Self-pay | Admitting: Physician Assistant

## 2018-11-20 DIAGNOSIS — E039 Hypothyroidism, unspecified: Secondary | ICD-10-CM

## 2018-11-22 ENCOUNTER — Encounter: Payer: Self-pay | Admitting: Physician Assistant

## 2018-12-07 NOTE — Progress Notes (Signed)
FOLLOW UP  Assessment and Plan:   Mild memory disturbance MRI stable, lab workup unremarkable Dr. Krista Blue recommended no further interventions  Monitor  Hypertension Well controlled with current medications  Monitor blood pressure at home; patient to call if consistently greater than 130/80 Continue DASH diet.   Reminder to go to the ER if any CP, SOB, nausea, dizziness, severe HA, changes vision/speech, left arm numbness and tingling and jaw pain.  Cholesterol Currently above goal; on max dose simvastatin; if significantly further elevated, switch agent to atorvastatin or rosuvastatin Continue low cholesterol diet and exercise.  Check lipid panel.   Prediabetes Continue diet and exercise.  Perform daily foot/skin check, notify office of any concerning changes.  Check A1C  Obesity with co morbidities Long discussion about weight loss, diet, and exercise Recommended diet heavy in fruits and veggies and low in animal meats, cheeses, and dairy products, appropriate calorie intake Discussed ideal weight for height and initial weight goal (175 lb) Will follow up in 3 months  Vitamin D Def Below goal at last visit;  Check Vit D level  Continue diet and meds as discussed. Further disposition pending results of labs. Discussed med's effects and SE's.   Over 30 minutes of exam, counseling, chart review, and critical decision making was performed.   Future Appointments  Date Time Provider Advance  04/24/2019  3:00 PM Vicie Mutters, PA-C [id number] None    ----------------------------------------------------------------------------------------------------------------------  HPI 58 y.o. female  presents for 3 month follow up on hypertension, cholesterol, prediabetes, obesity and vitamin D deficiency.   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 Laboratory evaluations  showed no treatable etiology. Dr. Krista Blue (neurology) recommends monitoring only at this time.   BMI is Body mass index is 30.45 kg/m., she has been working on exercise (doing "walking off the pounds" program with her daughter, doing 3 days a week, ~30 min). She has been having some right ankle pain, worse with sitting for a long time and then first gets up, better with walking.  Wt Readings from Last 3 Encounters:  12/10/18 183 lb (83 kg)  04/18/18 182 lb 6.4 oz (82.7 kg)  10/24/17 178 lb (80.7 kg)   Today their BP is BP: 124/68  She does workout. She denies chest pain, shortness of breath, dizziness.   She is on cholesterol medication (SWITCHED FROM SIMVASTATIN TO CRESTOR 10 MG LAST VISIT) and denies myalgias. Her cholesterol is not at goal. The cholesterol last visit was:   Lab Results  Component Value Date   CHOL 238 (H) 04/18/2018   HDL 46 (L) 04/18/2018   LDLCALC 157 (H) 04/18/2018   TRIG 191 (H) 04/18/2018   CHOLHDL 5.2 (H) 04/18/2018    She has been working on diet and exercise for prediabetes, and denies foot ulcerations, increased appetite, nausea, paresthesia of the feet, polydipsia, polyuria, visual disturbances, vomiting and weight loss. Last A1C in the office was:  Lab Results  Component Value Date   HGBA1C 6.1 (H) 04/18/2018   Patient is on Vitamin D supplement.   Lab Results  Component Value Date   VD25OH 42 04/18/2018        Current Medications:  Current Outpatient Medications on File Prior to Visit  Medication Sig  . atorvastatin (LIPITOR) 40 MG tablet Take 1 tablet (40 mg total) by mouth daily.  . Cholecalciferol (VITAMIN D3) 5000 UNITS TABS Take 5,000 Units by  mouth daily.  Marland Kitchen. levothyroxine (SYNTHROID) 150 MCG tablet Take 1 tablet daily on an empty stomach with only water for 30 minutes & no Antacid meds, Calcium or Magnesium for 4 hours & avoid Biotin  . Multiple Vitamins-Minerals (HAIR/SKIN/NAILS PO) Take by mouth daily.  . Pyridoxine HCl (VITAMIN B6) 200 MG  TABS Take by mouth daily.  . rosuvastatin (CRESTOR) 10 MG tablet Take 1 tablet (10 mg total) by mouth at bedtime.  . vitamin B-12 (CYANOCOBALAMIN) 100 MCG tablet Take 100 mcg by mouth daily.  . ranitidine (ZANTAC) 300 MG tablet Take 1 tablet (300 mg total) by mouth 2 (two) times daily.   No current facility-administered medications on file prior to visit.      Allergies:  Allergies  Allergen Reactions  . Cephalexin     Throat swelling     Medical History:  Past Medical History:  Diagnosis Date  . Abnormal MRI   . Colon polyp   . Hot flashes   . Hypercholesteremia   . Hypothyroid   . Memory loss   . Osteoarthritis    Family history- Reviewed and unchanged Social history- Reviewed and unchanged   Review of Systems:  Review of Systems  Constitutional: Negative for malaise/fatigue and weight loss.  HENT: Negative for hearing loss and tinnitus.   Eyes: Negative for blurred vision and double vision.  Respiratory: Negative for cough, shortness of breath and wheezing.   Cardiovascular: Negative for chest pain, palpitations, orthopnea, claudication and leg swelling.  Gastrointestinal: Negative for abdominal pain, blood in stool, constipation, diarrhea, heartburn, melena, nausea and vomiting.  Genitourinary: Negative.   Musculoskeletal: Negative for joint pain and myalgias.  Skin: Negative for rash.  Neurological: Negative for dizziness, tingling, sensory change, weakness and headaches.  Endo/Heme/Allergies: Negative for polydipsia.  Psychiatric/Behavioral: Positive for memory loss.  All other systems reviewed and are negative.    Physical Exam: BP 124/68   Pulse 64   Temp 97.6 F (36.4 C)   Wt 183 lb (83 kg)   SpO2 97%   BMI 30.45 kg/m  Wt Readings from Last 3 Encounters:  12/10/18 183 lb (83 kg)  04/18/18 182 lb 6.4 oz (82.7 kg)  10/24/17 178 lb (80.7 kg)   General Appearance: Well nourished, in no apparent distress. Eyes: PERRLA, EOMs, conjunctiva no swelling  or erythema Sinuses: No Frontal/maxillary tenderness ENT/Mouth: Ext aud canals clear, TMs without erythema, bulging. No erythema, swelling, or exudate on post pharynx.  Tonsils not swollen or erythematous. Hearing normal.  Neck: Supple, thyroid normal.  Respiratory: Respiratory effort normal, BS equal bilaterally without rales, rhonchi, wheezing or stridor.  Cardio: RRR with no MRGs. Brisk peripheral pulses without edema.  Abdomen: Soft, + BS.  Non tender, no guarding, rebound, hernias, masses. Lymphatics: Non tender without lymphadenopathy.  Musculoskeletal: Full ROM, 5/5 strength, Normal gait Skin: Warm, dry without rashes, lesions, ecchymosis.  Neuro: Cranial nerves intact. No cerebellar symptoms.  Psych: Awake and oriented X 3, normal affect, Insight and Judgment appropriate.    Quentin MullingAmanda Ladon Heney, PA-C 3:45 PM [facility]

## 2018-12-10 ENCOUNTER — Encounter: Payer: Self-pay | Admitting: Physician Assistant

## 2018-12-10 ENCOUNTER — Other Ambulatory Visit: Payer: Self-pay

## 2018-12-10 ENCOUNTER — Ambulatory Visit: Payer: 59 | Admitting: Physician Assistant

## 2018-12-10 VITALS — BP 124/68 | HR 64 | Temp 97.6°F | Wt 183.0 lb

## 2018-12-10 DIAGNOSIS — E559 Vitamin D deficiency, unspecified: Secondary | ICD-10-CM

## 2018-12-10 DIAGNOSIS — I10 Essential (primary) hypertension: Secondary | ICD-10-CM | POA: Diagnosis not present

## 2018-12-10 DIAGNOSIS — E785 Hyperlipidemia, unspecified: Secondary | ICD-10-CM | POA: Diagnosis not present

## 2018-12-10 DIAGNOSIS — E039 Hypothyroidism, unspecified: Secondary | ICD-10-CM

## 2018-12-10 NOTE — Patient Instructions (Addendum)
VITAMIN D IS IMPORTANT  Vitamin D goal is between 60-80  Please make sure that you are taking your Vitamin D as directed.   It is very important as a natural anti-inflammatory   helping hair, skin, and nails, as well as reducing stroke and heart attack risk.   It helps your bones and helps with mood.  We want you on at least 5000 IU daily OR CAN DO 50,000 1-2 TIMES A WEEK  It also decreases numerous cancer risks so please take it as directed.   Low Vit D is associated with a 200-300% higher risk for CANCER   and 200-300% higher risk for HEART   ATTACK  &  STROKE.    .....................................Marland Kitchen.  It is also associated with higher death rate at younger ages,   autoimmune diseases like Rheumatoid arthritis, Lupus, Multiple Sclerosis.     Also many other serious conditions, like depression, Alzheimer's  Dementia, infertility, muscle aches, fatigue, fibromyalgia - just to name a few.  +++++++++++++++++++  Can get liquid vitamin D from [employer]  OR [place] at  [facility] [address], [place], [place] [id number] Or you can try earth fare    Posterior Tibial Tendinitis Rehab Ask your health care provider which exercises are safe for you. Do exercises exactly as told by your health care provider and adjust them as directed. It is normal to feel mild stretching, pulling, tightness, or discomfort as you do these exercises. Stop right away if you feel sudden pain or your pain gets worse. Do not begin these exercises until told by your health care provider. Stretching and range-of-motion exercises These exercises warm up your muscles and joints and improve the movement and flexibility in your ankle and foot. These exercises may also help to relieve pain. Standing wall calf stretch, knee straight  1. Stand with your hands against a wall. 2. Extend your left / right leg behind you, and bend your front knee slightly. If directed, place a folded washcloth under  the arch of your foot for support. 3. Point the toes of your back foot slightly inward. 4. Keeping your heels on the floor and your back knee straight, shift your weight toward the wall. Do not allow your back to arch. You should feel a gentle stretch in your upper left / right calf. 5. Hold this position for __________ seconds. Repeat __________ times. Complete this exercise __________ times a day. Standing wall calf stretch, knee bent 1. Stand with your hands against a wall. 2. Extend your left / right leg behind you, and bend your front knee slightly. If directed, place a folded washcloth under the arch of your foot for support. 3. Point the toes of your back foot slightly inward. 4. Unlock your back knee so it is bent. Keep your heels on the floor. You should feel a gentle stretch deep in your lower left / right calf. 5. Hold this position for __________ seconds. Repeat __________ times. Complete this exercise __________ times a day. Strengthening exercises These exercises build strength and endurance in your ankle and foot. Endurance is the ability to use your muscles for a long time, even after they get tired. Ankle inversion with band 1. Secure one end of a rubber exercise band or tubing to a fixed object, such as a table leg or a pole, that will stay still when the band is pulled. 2. Loop the other end of the band around the middle of your left / right foot. 3.  Sit on the floor facing the object with your left / right leg extended. The band or tube should be slightly tense when your foot is relaxed. 4. Leading with your big toe, slowly bring your left / right foot and ankle inward, toward your other foot (inversion). 5. Hold this position for __________ seconds. 6. Slowly return your foot to the starting position. Repeat __________ times. Complete this exercise __________ times a day. Towel curls  1. Sit in a chair on a non-carpeted surface, and put your feet on the floor. 2. Place a  towel in front of your feet. If told by your health care provider, add a __________ weight to the end of the towel. 3. Keeping your heel on the floor, put your left / right foot on the towel. 4. Pull the towel toward you by grabbing the towel with your toes and curling them under. Keep your heel on the floor while you do this. 5. Let your toes relax. 6. Grab the towel with your toes again. Keep going until the towel is completely underneath your foot. Repeat __________ times. Complete this exercise __________ times a day. Balance exercise This exercise improves or maintains your balance. Balance is important in preventing falls. Single leg stand 1. Without wearing shoes, stand near a railing or in a doorway. You may hold on to the railing or door frame as needed for balance. 2. Stand on your left / right foot. Keep your big toe down on the floor and try to keep your arch lifted. ? If balancing in this position is too easy, try the exercise with your eyes closed or while standing on a pillow. 3. Hold this position for __________ seconds. Repeat __________ times. Complete this exercise __________ times a day. This information is not intended to replace advice given to you by your health care provider. Make sure you discuss any questions you have with your health care provider. Document Released: 03/28/2005 Document Revised: 07/24/2018 Document Reviewed: 05/30/2018 Elsevier Patient Education  2020 [employer].

## 2018-12-11 LAB — CBC WITH DIFFERENTIAL/PLATELET
Absolute Monocytes: 693 cells/uL (ref 200–950)
Basophils Absolute: 54 cells/uL (ref 0–200)
Basophils Relative: 0.6 %
Eosinophils Absolute: 72 cells/uL (ref 15–500)
Eosinophils Relative: 0.8 %
HCT: 35 % (ref 35.0–45.0)
Hemoglobin: 11.9 g/dL (ref 11.7–15.5)
Lymphs Abs: 3987 cells/uL — ABNORMAL HIGH (ref 850–3900)
MCH: 29.2 pg (ref 27.0–33.0)
MCHC: 34 g/dL (ref 32.0–36.0)
MCV: 86 fL (ref 80.0–100.0)
MPV: 11 fL (ref 7.5–12.5)
Monocytes Relative: 7.7 %
Neutro Abs: 4194 cells/uL (ref 1500–7800)
Neutrophils Relative %: 46.6 %
Platelets: 297 10*3/uL (ref 140–400)
RBC: 4.07 10*6/uL (ref 3.80–5.10)
RDW: 13.1 % (ref 11.0–15.0)
Total Lymphocyte: 44.3 %
WBC: 9 10*3/uL (ref 3.8–10.8)

## 2018-12-11 LAB — COMPLETE METABOLIC PANEL WITH GFR
AG Ratio: 1.7 (calc) (ref 1.0–2.5)
ALT: 38 U/L — ABNORMAL HIGH (ref 6–29)
AST: 25 U/L (ref 10–35)
Albumin: 4.3 g/dL (ref 3.6–5.1)
Alkaline phosphatase (APISO): 46 U/L (ref 37–153)
BUN/Creatinine Ratio: 11 (calc) (ref 6–22)
BUN: 14 mg/dL (ref 7–25)
CO2: 33 mmol/L — ABNORMAL HIGH (ref 20–32)
Calcium: 10.1 mg/dL (ref 8.6–10.4)
Chloride: 103 mmol/L (ref 98–110)
Creat: 1.28 mg/dL — ABNORMAL HIGH (ref 0.50–1.05)
GFR, Est African American: 53 mL/min/{1.73_m2} — ABNORMAL LOW (ref >=60)
GFR, Est Non African American: 46 mL/min/{1.73_m2} — ABNORMAL LOW (ref >=60)
Globulin: 2.6 g/dL (calc) (ref 1.9–3.7)
Glucose, Bld: 97 mg/dL (ref 65–99)
Potassium: 4.2 mmol/L (ref 3.5–5.3)
Sodium: 141 mmol/L (ref 135–146)
Total Bilirubin: 0.5 mg/dL (ref 0.2–1.2)
Total Protein: 6.9 g/dL (ref 6.1–8.1)

## 2018-12-11 LAB — LIPID PANEL
Cholesterol: 170 mg/dL (ref <200)
HDL: 40 mg/dL — ABNORMAL LOW (ref >=50)
LDL Cholesterol (Calc): 98 mg/dL (calc)
Non-HDL Cholesterol (Calc): 130 mg/dL (calc) — ABNORMAL HIGH (ref <130)
Total CHOL/HDL Ratio: 4.3 (calc) (ref <5.0)
Triglycerides: 199 mg/dL — ABNORMAL HIGH (ref <150)

## 2018-12-11 LAB — TSH: TSH: 1.3 mIU/L (ref 0.40–4.50)

## 2018-12-11 LAB — VITAMIN D 25 HYDROXY (VIT D DEFICIENCY, FRACTURES): Vit D, 25-Hydroxy: 33 ng/mL (ref 30–100)

## 2019-02-07 ENCOUNTER — Other Ambulatory Visit: Payer: Self-pay | Admitting: Internal Medicine

## 2019-02-07 DIAGNOSIS — E039 Hypothyroidism, unspecified: Secondary | ICD-10-CM

## 2019-04-22 NOTE — Progress Notes (Signed)
Complete Physical  Assessment and Plan:  Screening, anemia, deficiency, iron -     Iron,Total/Total Iron Binding Cap  Hypothyroidism, unspecified hypothyroidism type Hypothyroidism-check TSH level, continue medications the same, reminded to take on an empty stomach 30-4mins before food.  - TSH  Hyperlipidemia -continue medications, check lipids, decrease fatty foods, increase activity.  - CBC with Differential/Platelet - BASIC METABOLIC PANEL WITH GFR - Hepatic function panel - Lipid panel - EKG 12-Lead  Prediabetes Continue weight loss Discussed general issues about diabetes pathophysiology and management.  Agricultural engineer distributed.  Encouraged aerobic exercise. - Hemoglobin A1c - Insulin, fasting  Osteopenia Will get at 59  Osteoarthritis, unspecified osteoarthritis type, unspecified site controlled   Colon polyp Repeat 2020- has OV   Medication management - Magnesium  Vitamin D deficiency - VITAMIN D 25 Hydroxy (Vit-D Deficiency, Fractures)   Screening for blood or protein in urine - Urinalysis, Routine w reflex microscopic (not at [facility]) - Microalbumin / creatinine urine ratio  Encounter for general adult medical examination with abnormal findings 59 year  Abnormal MRI of head Continue follow up neuro  BMI 30.0-30.9,adult - follow up 3 months for progress monitoring - increase veggies, decrease carbs - long discussion about weight loss, diet, and exercise  Osteoarthritis, unspecified osteoarthritis type, unspecified site -     meloxicam (MOBIC) 15 MG tablet; Take one daily with food for 2 weeks, can take with tylenol, can not take with aleve, iburpofen, then as needed daily for pain -     cyclobenzaprine (FLEXERIL) 10 MG tablet; Take 1 tablet (10 mg total) by mouth 3 (three) times daily as needed for muscle spasms. Will refer to ortho if not better  Discussed med's effects and SE's. Screening labs and tests as requested with regular follow-up  as recommended. Over 40 minutes of exam, counseling, chart review, and complex, high level critical decision making was performed this visit.   HPI  59 y.o. AA female  presents for a complete physical.  She has been having left lower back pain x 1 month with pain in her hip with walking, she also has shooting pains in bilateral feet. Right foot shooting pain in the toes and left foot towards the ankle. Constant pain, burning. Nothing makes it worse, nothing makes it better. She has some perceived left leg weakness. No burning with urination, no fever, chills, no inconstance, no hematuria.  History of left hip pain with OA DG 2016. MRI lumbar 2006 Mild facet degenerative changes at the T12-L1 and L1-2 levels. Mild facet degenerative changes are also noted at L3-4, L4-5, and L5-S1. There is no central nor foraminal stenosis. There is no disc herniation.   IMPRESSION:  Facet degenerative changes.   Her blood pressure has been controlled at home, today their BP is BP: 124/78 She does workout, walking on new treadmill x sunday.  She denies chest pain, shortness of breath, dizziness.  BMI is Body mass index is 31.1 kg/m., she is working on diet and exercise. Wt Readings from Last 3 Encounters:  04/24/19 184 lb (83.5 kg)  12/10/18 183 lb (83 kg)  04/18/18 182 lb 6.4 oz (82.7 kg)    She follows with Dr. Terrace Arabia for memory disturbance, negative work up June 2017, normal B12, normal ferritin, negative HIV, RPR, she had a positive ANA but negative antiDNA. No tick exposure. She sleeps well, is not under stress.  She will have headaches that she describes as "muscle spasms", once or twice a month. Last for seconds, less  than a minute, on her left side. No cause that she can think. No dizziness, nausea, hearing loss, denies increased stress, changes in vision, weakness, tremors.  She is on cholesterol medication, lipitor 40mg  and denies myalgias. Her cholesterol is at goal. The cholesterol last visit was:    Lab Results  Component Value Date   CHOL 170 04/24/2019   HDL 43 (L) 04/24/2019   LDLCALC 107 (H) 04/24/2019   TRIG 108 04/24/2019   CHOLHDL 4.0 04/24/2019   She has been working on diet and exercise for prediabetes, and denies paresthesia of the feet, polydipsia, polyuria and visual disturbances. Last A1C in the office was:  Lab Results  Component Value Date   HGBA1C 6.4 (H) 04/24/2019   Last GFR: Lab Results  Component Value Date   GFRAA 68 04/24/2019   Patient is on Vitamin D supplement, she is on 5000 units. Lab Results  Component Value Date   VD25OH 44 04/24/2019     She is on thyroid medication. Her medication was not changed last visit, she is on 1 pill Monday- Thursday, none on F,S,S. She is on a biotin supplement.  Lab Results  Component Value Date   TSH 1.30 12/10/2018   Current Medications:   Current Outpatient Medications (Endocrine & Metabolic):  .  levothyroxine (SYNTHROID) 150 MCG tablet, Take 1 tablet daily on an empty stomach with only water for 30 minutes & no Antacid meds, Calcium or Magnesium for 4 hours & avoid Biotin  Current Outpatient Medications (Cardiovascular):  .  atorvastatin (LIPITOR) 40 MG tablet, Take 1 tablet (40 mg total) by mouth daily.   Current Outpatient Medications (Analgesics):  .  meloxicam (MOBIC) 15 MG tablet, Take one daily with food for 2 weeks, can take with tylenol, can not take with aleve, iburpofen, then as needed daily for pain  Current Outpatient Medications (Hematological):  .  vitamin B-12 (CYANOCOBALAMIN) 100 MCG tablet, Take 100 mcg by mouth daily.  Current Outpatient Medications (Other):  Marland Kitchen  Cholecalciferol (VITAMIN D3) 5000 UNITS TABS, Take 5,000 Units by mouth daily. .  Multiple Vitamins-Minerals (HAIR/SKIN/NAILS PO), Take by mouth daily. .  Pyridoxine HCl (VITAMIN B6) 200 MG TABS, Take by mouth daily. .  cyclobenzaprine (FLEXERIL) 10 MG tablet, Take 1 tablet (10 mg total) by mouth 3 (three) times daily as  needed for muscle spasms.  Health Maintenance:   Immunization History  Administered Date(s) Administered  . PPD Test 03/13/2014  . Tdap 04/11/2006, 04/10/2017   TDAP: 2018 Pneumovax: N/A Prevnar 13: N/A Flu vaccine: declines Zostavax: N/A  LMP: Hysterectomy Pap: 2016 MGM: 11/2018 DEXA: 11/2016 osteopenia- will repeat next MGM Colonoscopy: 06/2018 EGD: N/A MRI brain 2019- abnormal but unchanged  Patient Care Team: Vicie Mutters, PA-C as PCP - General (Physician Assistant) Calton Dach, MD as Referring Physician (Optometry) Minus Breeding, MD as Consulting Physician (Cardiology) Juanita Craver, MD as Consulting Physician (Gastroenterology) Melrose Nakayama, MD as Consulting Physician (Orthopedic Surgery)  Medical History:  Past Medical History:  Diagnosis Date  . Abnormal MRI   . Colon polyp   . Hot flashes   . Hypercholesteremia   . Hypothyroid   . Memory loss   . Osteoarthritis    Allergies Allergies  Allergen Reactions  . Cephalexin     Throat swelling    SURGICAL HISTORY She  has a past surgical history that includes Cesarean section (80 & 82) and Vaginal hysterectomy (2002). FAMILY HISTORY Her family history includes Cancer in her maternal aunt, maternal aunt, mother,  paternal aunt, paternal aunt, paternal aunt, and paternal aunt; Diabetes in her father and mother; Heart disease in her father and mother; Hypertension in her father and mother. SOCIAL HISTORY She  reports that she has never smoked. She has never used smokeless tobacco. She reports that she does not drink alcohol or use drugs.  Review of Systems: Review of Systems  Constitutional: Negative for diaphoresis and malaise/fatigue.  HENT: Negative.   Eyes: Negative.   Respiratory: Negative for cough, hemoptysis, sputum production, shortness of breath and wheezing.   Cardiovascular: Negative for chest pain, palpitations, orthopnea, claudication, leg swelling and PND.  Gastrointestinal:  Negative.   Genitourinary: Negative.   Musculoskeletal: Negative.   Skin: Negative.   Neurological: Negative.   Endo/Heme/Allergies: Negative.   Psychiatric/Behavioral: Negative.     Physical Exam: Estimated body mass index is 31.1 kg/m as calculated from the following:   Height as of this encounter: 5' 4.5" (1.638 m).   Weight as of this encounter: 184 lb (83.5 kg). BP 124/78   Pulse 77   Temp (!) 97.5 F (36.4 C)   Ht 5' 4.5" (1.638 m)   Wt 184 lb (83.5 kg)   SpO2 98%   BMI 31.10 kg/m  General Appearance: Well nourished, in no apparent distress.  Eyes: PERRLA, EOMs, conjunctiva no swelling or erythema, normal fundi and vessels.  Sinuses: No Frontal/maxillary tenderness  ENT/Mouth: Ext aud canals clear, normal light reflex with TMs without erythema, bulging. Good dentition. No erythema, swelling, or exudate on post pharynx. Tonsils not swollen or erythematous. Hearing normal.  Neck: Supple, thyroid normal. No bruits  Respiratory: Respiratory effort normal, BS equal bilaterally without rales, rhonchi, wheezing or stridor.  Cardio: RRR without murmurs, rubs or gallops. Brisk peripheral pulses without edema.  Chest: symmetric, with normal excursions and percussion.  Breasts: Symmetric, without lumps, nipple discharge, retractions.  Abdomen: Soft, nontender, no guarding, rebound, hernias, masses, or organomegaly.  Lymphatics: Non tender without lymphadenopathy.  Genitourinary: defer Musculoskeletal: Full ROM all peripheral extremities,5/5 strength, and normal gait, negative straight leg.   Skin: Warm, dry without rashes, lesions, ecchymosis. Neuro: Cranial nerves intact, reflexes equal bilaterally. Normal muscle tone, no cerebellar symptoms. Sensation intact.  Psych: Awake and oriented X 3, normal affect, Insight and Judgment appropriate.   EKG: WNL no ST changes. AORTA SCAN: defer  Quentin Mulling 12:20 PM [facility]

## 2019-04-24 ENCOUNTER — Other Ambulatory Visit: Payer: Self-pay

## 2019-04-24 ENCOUNTER — Encounter: Payer: Self-pay | Admitting: Physician Assistant

## 2019-04-24 ENCOUNTER — Ambulatory Visit: Payer: 59 | Admitting: Physician Assistant

## 2019-04-24 VITALS — BP 124/78 | HR 77 | Temp 97.5°F | Ht 64.5 in | Wt 184.0 lb

## 2019-04-24 DIAGNOSIS — R413 Other amnesia: Secondary | ICD-10-CM

## 2019-04-24 DIAGNOSIS — R7309 Other abnormal glucose: Secondary | ICD-10-CM

## 2019-04-24 DIAGNOSIS — E66811 Obesity, class 1: Secondary | ICD-10-CM

## 2019-04-24 DIAGNOSIS — M1990 Unspecified osteoarthritis, unspecified site: Secondary | ICD-10-CM

## 2019-04-24 DIAGNOSIS — M8580 Other specified disorders of bone density and structure, unspecified site: Secondary | ICD-10-CM

## 2019-04-24 DIAGNOSIS — Z0001 Encounter for general adult medical examination with abnormal findings: Secondary | ICD-10-CM

## 2019-04-24 DIAGNOSIS — E039 Hypothyroidism, unspecified: Secondary | ICD-10-CM

## 2019-04-24 DIAGNOSIS — I10 Essential (primary) hypertension: Secondary | ICD-10-CM

## 2019-04-24 DIAGNOSIS — E785 Hyperlipidemia, unspecified: Secondary | ICD-10-CM

## 2019-04-24 DIAGNOSIS — E559 Vitamin D deficiency, unspecified: Secondary | ICD-10-CM

## 2019-04-24 DIAGNOSIS — R930 Abnormal findings on diagnostic imaging of skull and head, not elsewhere classified: Secondary | ICD-10-CM

## 2019-04-24 DIAGNOSIS — E669 Obesity, unspecified: Secondary | ICD-10-CM

## 2019-04-24 DIAGNOSIS — K635 Polyp of colon: Secondary | ICD-10-CM

## 2019-04-24 DIAGNOSIS — Z79899 Other long term (current) drug therapy: Secondary | ICD-10-CM

## 2019-04-24 MED ORDER — MELOXICAM 15 MG PO TABS: ORAL_TABLET | ORAL | 1 refills | Status: DC

## 2019-04-24 MED ORDER — CYCLOBENZAPRINE HCL 10 MG PO TABS: 10.0000 mg | ORAL_TABLET | Freq: Three times a day (TID) | ORAL | 1 refills | Status: DC | PRN

## 2019-04-24 NOTE — Patient Instructions (Addendum)
Stop the biotin for 2-4 week before getting labs, this can mess with the thyroid number.  Come back for lab only for TSH off the biotin.   BACK PAIN  Try the exercises and other information in the back care manual.   Can do heating pad and/or salonpas patches at night  You can take meloxicam once during the day as needed (avoid taking other NSAIDS like Alleve or Ibuprofen while taking this)   You can take flexeril if needed at bedtime for muscle spasm. This can be taken up to every 8 hours, but causes sedation, so should not drive or operate heavy machinery while taking this medicine.   Go to the ER if you have any new weakness in your legs, have trouble controlling your urine or bowels, or have worsening pain.   If you are not better in 1-3 month we will refer you to ortho   Back pain Rehab Ask your health care provider which exercises are safe for you. Do exercises exactly as told by your health care provider and adjust them as directed. It is normal to feel mild stretching, pulling, tightness, or discomfort as you do these exercises, but you should stop right away if you feel sudden pain or your pain gets worse. Do not begin these exercises until told by your health care provider. Stretching and range of motion exercises These exercises warm up your muscles and joints and improve the movement and flexibility of your hips and your back. These exercises also help to relieve pain, numbness, and tingling. Exercise A: Sciatic nerve glide 1. Sit in a chair with your head facing down toward your chest. Place your hands behind your back. Let your shoulders slump forward. 2. Slowly straighten one of your knees while you tilt your head back as if you are looking toward the ceiling. Only straighten your leg as far as you can without making your symptoms worse. 3. Hold for __________ seconds. 4. Slowly return to the starting position. 5. Repeat with your other leg. Repeat __________ times.  Complete this exercise __________ times a day. Exercise B: Knee to chest with hip adduction and internal rotation  1. Lie on your back on a firm surface with both legs straight. 2. Bend one of your knees and move it up toward your chest until you feel a gentle stretch in your lower back and buttock. Then, move your knee toward the shoulder that is on the opposite side from your leg. ? Hold your leg in this position by holding onto the front of your knee. 3. Hold for __________ seconds. 4. Slowly return to the starting position. 5. Repeat with your other leg. Repeat __________ times. Complete this exercise __________ times a day. Exercise C: Prone extension on elbows  1. Lie on your abdomen on a firm surface. A bed may be too soft for this exercise. 2. Prop yourself up on your elbows. 3. Use your arms to help lift your chest up until you feel a gentle stretch in your abdomen and your lower back. ? This will place some of your body weight on your elbows. If this is uncomfortable, try stacking pillows under your chest. ? Your hips should stay down, against the surface that you are lying on. Keep your hip and back muscles relaxed. 4. Hold for __________ seconds. 5. Slowly relax your upper body and return to the starting position. Repeat __________ times. Complete this exercise __________ times a day. Strengthening exercises These exercises build strength and  endurance in your back. Endurance is the ability to use your muscles for a long time, even after they get tired. Exercise D: Pelvic tilt 1. Lie on your back on a firm surface. Bend your knees and keep your feet flat. 2. Tense your abdominal muscles. Tip your pelvis up toward the ceiling and flatten your lower back into the floor. ? To help with this exercise, you may place a small towel under your lower back and try to push your back into the towel. 3. Hold for __________ seconds. 4. Let your muscles relax completely before you repeat this  exercise. Repeat __________ times. Complete this exercise __________ times a day. Exercise E: Alternating arm and leg raises  1. Get on your hands and knees on a firm surface. If you are on a hard floor, you may want to use padding to cushion your knees, such as an exercise mat. 2. Line up your arms and legs. Your hands should be below your shoulders, and your knees should be below your hips. 3. Lift your left leg behind you. At the same time, raise your right arm and straighten it in front of you. ? Do not lift your leg higher than your hip. ? Do not lift your arm higher than your shoulder. ? Keep your abdominal and back muscles tight. ? Keep your hips facing the ground. ? Do not arch your back. ? Keep your balance carefully, and do not hold your breath. 4. Hold for __________ seconds. 5. Slowly return to the starting position and repeat with your right leg and your left arm. Repeat __________ times. Complete this exercise __________ times a day. Posture and body mechanics  Body mechanics refers to the movements and positions of your body while you do your daily activities. Posture is part of body mechanics. Good posture and healthy body mechanics can help to relieve stress in your body's tissues and joints. Good posture means that your spine is in its natural S-curve position (your spine is neutral), your shoulders are pulled back slightly, and your head is not tipped forward. The following are general guidelines for applying improved posture and body mechanics to your everyday activities. Standing   When standing, keep your spine neutral and your feet about hip-width apart. Keep a slight bend in your knees. Your ears, shoulders, and hips should line up.  When you do a task in which you stand in one place for a long time, place one foot up on a stable object that is 2-4 inches (5-10 cm) high, such as a footstool. This helps keep your spine neutral. Sitting   When sitting, keep your  spine neutral and keep your feet flat on the floor. Use a footrest, if necessary, and keep your thighs parallel to the floor. Avoid rounding your shoulders, and avoid tilting your head forward.  When working at a desk or a computer, keep your desk at a height where your hands are slightly lower than your elbows. Slide your chair under your desk so you are close enough to maintain good posture.  When working at a computer, place your monitor at a height where you are looking straight ahead and you do not have to tilt your head forward or downward to look at the screen. Resting   When lying down and resting, avoid positions that are most painful for you.  If you have pain with activities such as sitting, bending, stooping, or squatting (flexion-based activities), lie in a position in which your body  does not bend very much. For example, avoid curling up on your side with your arms and knees near your chest (fetal position).  If you have pain with activities such as standing for a long time or reaching with your arms (extension-based activities), lie with your spine in a neutral position and bend your knees slightly. Try the following positions: ? Lying on your side with a pillow between your knees. ? Lying on your back with a pillow under your knees. Lifting   When lifting objects, keep your feet at least shoulder-width apart and tighten your abdominal muscles.  Bend your knees and hips and keep your spine neutral. It is important to lift using the strength of your legs, not your back. Do not lock your knees straight out.  Always ask for help to lift heavy or awkward objects. This information is not intended to replace advice given to you by your health care provider. Make sure you discuss any questions you have with your health care provider. Document Released: 03/28/2005 Document Revised: 12/03/2015 Document Reviewed: 12/12/2014 Elsevier Interactive Patient Education  [employer].

## 2019-04-25 LAB — URINALYSIS, ROUTINE W REFLEX MICROSCOPIC
Bilirubin Urine: NEGATIVE
Glucose, UA: NEGATIVE
Hgb urine dipstick: NEGATIVE
Ketones, ur: NEGATIVE
Leukocytes,Ua: NEGATIVE
Nitrite: NEGATIVE
Protein, ur: NEGATIVE
Specific Gravity, Urine: 1.006 (ref 1.001–1.03)
pH: <=5 (ref 5.0–8.0)

## 2019-04-25 LAB — COMPLETE METABOLIC PANEL WITH GFR
AG Ratio: 1.9 (calc) (ref 1.0–2.5)
ALT: 37 U/L — ABNORMAL HIGH (ref 6–29)
AST: 23 U/L (ref 10–35)
Albumin: 4.9 g/dL (ref 3.6–5.1)
Alkaline phosphatase (APISO): 52 U/L (ref 37–153)
BUN/Creatinine Ratio: 12 (calc) (ref 6–22)
BUN: 12 mg/dL (ref 7–25)
CO2: 32 mmol/L (ref 20–32)
Calcium: 10.3 mg/dL (ref 8.6–10.4)
Chloride: 101 mmol/L (ref 98–110)
Creat: 1.04 mg/dL — ABNORMAL HIGH (ref 0.50–0.99)
GFR, Est African American: 68 mL/min/{1.73_m2} (ref >=60)
GFR, Est Non African American: 58 mL/min/{1.73_m2} — ABNORMAL LOW (ref >=60)
Globulin: 2.6 g/dL (calc) (ref 1.9–3.7)
Glucose, Bld: 102 mg/dL — ABNORMAL HIGH (ref 65–99)
Potassium: 4.1 mmol/L (ref 3.5–5.3)
Sodium: 140 mmol/L (ref 135–146)
Total Bilirubin: 0.7 mg/dL (ref 0.2–1.2)
Total Protein: 7.5 g/dL (ref 6.1–8.1)

## 2019-04-25 LAB — VITAMIN D 25 HYDROXY (VIT D DEFICIENCY, FRACTURES): Vit D, 25-Hydroxy: 44 ng/mL (ref 30–100)

## 2019-04-25 LAB — MICROALBUMIN / CREATININE URINE RATIO
Creatinine, Urine: 55 mg/dL (ref 20–275)
Microalb Creat Ratio: 9 mcg/mg creat (ref <30)
Microalb, Ur: 0.5 mg/dL

## 2019-04-25 LAB — CBC WITH DIFFERENTIAL/PLATELET
Absolute Monocytes: 670 cells/uL (ref 200–950)
Basophils Absolute: 56 cells/uL (ref 0–200)
Basophils Relative: 0.6 %
Eosinophils Absolute: 74 cells/uL (ref 15–500)
Eosinophils Relative: 0.8 %
HCT: 38.5 % (ref 35.0–45.0)
Hemoglobin: 12.9 g/dL (ref 11.7–15.5)
Lymphs Abs: 4631 cells/uL — ABNORMAL HIGH (ref 850–3900)
MCH: 28.7 pg (ref 27.0–33.0)
MCHC: 33.5 g/dL (ref 32.0–36.0)
MCV: 85.7 fL (ref 80.0–100.0)
MPV: 10.7 fL (ref 7.5–12.5)
Monocytes Relative: 7.2 %
Neutro Abs: 3869 cells/uL (ref 1500–7800)
Neutrophils Relative %: 41.6 %
Platelets: 306 10*3/uL (ref 140–400)
RBC: 4.49 10*6/uL (ref 3.80–5.10)
RDW: 13.3 % (ref 11.0–15.0)
Total Lymphocyte: 49.8 %
WBC: 9.3 10*3/uL (ref 3.8–10.8)

## 2019-04-25 LAB — LIPID PANEL
Cholesterol: 170 mg/dL (ref <200)
HDL: 43 mg/dL — ABNORMAL LOW (ref >=50)
LDL Cholesterol (Calc): 107 mg/dL (calc) — ABNORMAL HIGH
Non-HDL Cholesterol (Calc): 127 mg/dL (calc) (ref <130)
Total CHOL/HDL Ratio: 4 (calc) (ref <5.0)
Triglycerides: 108 mg/dL (ref <150)

## 2019-04-25 LAB — HEMOGLOBIN A1C
Hgb A1c MFr Bld: 6.4 % of total Hgb — ABNORMAL HIGH (ref <5.7)
Mean Plasma Glucose: 137 (calc)
eAG (mmol/L): 7.6 (calc)

## 2019-04-25 LAB — MAGNESIUM: Magnesium: 2.1 mg/dL (ref 1.5–2.5)

## 2019-05-22 ENCOUNTER — Other Ambulatory Visit: Payer: Self-pay

## 2019-05-22 ENCOUNTER — Other Ambulatory Visit: Payer: 59

## 2019-05-22 DIAGNOSIS — E039 Hypothyroidism, unspecified: Secondary | ICD-10-CM

## 2019-05-23 LAB — TSH: TSH: 0.52 mIU/L (ref 0.40–4.50)

## 2019-07-19 ENCOUNTER — Other Ambulatory Visit: Payer: Self-pay | Admitting: Internal Medicine

## 2019-07-19 DIAGNOSIS — E039 Hypothyroidism, unspecified: Secondary | ICD-10-CM

## 2019-08-14 ENCOUNTER — Other Ambulatory Visit: Payer: Self-pay | Admitting: Physician Assistant

## 2019-08-14 DIAGNOSIS — E785 Hyperlipidemia, unspecified: Secondary | ICD-10-CM

## 2019-08-31 ENCOUNTER — Ambulatory Visit: Payer: 59 | Attending: Internal Medicine

## 2019-08-31 DIAGNOSIS — Z23 Encounter for immunization: Secondary | ICD-10-CM

## 2019-08-31 NOTE — Progress Notes (Signed)
   Covid-19 Vaccination Clinic  Name:  Vicki Wright    MRN: [id number] DOB: [birth year]  08/31/2019  Ms. Tieszen was observed post Covid-19 immunization for 15 minutes without incident. She was provided with Vaccine Information Sheet and instruction to access the V-Safe system.   Ms. Fill was instructed to call 911 with any severe reactions post vaccine: Marland Kitchen Difficulty breathing  . Swelling of face and throat  . A fast heartbeat  . A bad rash all over body  . Dizziness and weakness   Immunizations Administered    Name Date Dose VIS Date Route   Pfizer COVID-19 Vaccine 08/31/2019  8:37 AM 0.3 mL 06/05/2018 Intramuscular   Manufacturer: [employer], [employer]   Lot: [id number]   NDC: [address]-7

## 2019-09-19 ENCOUNTER — Other Ambulatory Visit: Payer: Self-pay | Admitting: Physician Assistant

## 2019-09-19 DIAGNOSIS — Z1231 Encounter for screening mammogram for malignant neoplasm of breast: Secondary | ICD-10-CM

## 2019-09-23 ENCOUNTER — Telehealth: Payer: Self-pay | Admitting: Physician Assistant

## 2019-09-23 ENCOUNTER — Ambulatory Visit: Payer: 59

## 2019-09-23 ENCOUNTER — Ambulatory Visit: Payer: 59 | Attending: Internal Medicine

## 2019-09-23 DIAGNOSIS — Z23 Encounter for immunization: Secondary | ICD-10-CM

## 2019-09-23 DIAGNOSIS — M8580 Other specified disorders of bone density and structure, unspecified site: Secondary | ICD-10-CM

## 2019-09-23 NOTE — Progress Notes (Signed)
   Covid-19 Vaccination Clinic  Name:  LACOYA WILBANKS    MRN: [id number] DOB: 1960-10-08  09/23/2019  Ms. Wahid was observed post Covid-19 immunization for 15 minutes without incident. She was provided with Vaccine Information Sheet and instruction to access the V-Safe system.   Ms. Hribar was instructed to call 911 with any severe reactions post vaccine: Marland Kitchen Difficulty breathing  . Swelling of face and throat  . A fast heartbeat  . A bad rash all over body  . Dizziness and weakness   Immunizations Administered    Name Date Dose VIS Date Route   Pfizer COVID-19 Vaccine 09/23/2019  3:51 PM 0.3 mL 06/05/2018 Intramuscular   Manufacturer: [employer], [employer]   Lot: [id number]   NDC: [address]-2

## 2019-09-23 NOTE — Telephone Encounter (Signed)
-----   Message from Gregery Na, CMA sent at 09/23/2019 10:53 AM EDT ----- Regarding: DEXA scan request Contact: [phone] PER PATIENT: Has a MGM appt on 11/26/2019 at 3:30pm @ [facility] & would like you to send a request for BONE DENSITY scan on the same day as her MGM.  Please & thank you

## 2019-10-29 NOTE — Progress Notes (Signed)
Subjective:    Patient ID: Vicki Wright, female    DOB: [birth year], 59 y.o.   MRN: [id number]  HPI 59 y.o. AAF with history of chol, preDM, hypothyroidism, HTN, abnormal MRI and some memory issues following with Dr. Terrace Arabia.  presents with dry eyes and abnormal gait.  She messaged on 07/15 stating she was having dry eyes and having abnormal gait, she was informed to come in for a visit.    She has been having conjunctival erythema x 4 months, has seen her eye doctor, Dr. Jettie Booze. She was given alaway (antihistamine drops) and refresh, only using once a day.  She does not wear contacts No blurred vision, no double vision, no issues with her vision. No itching, no watering, no burning issues.  No new shampoos, no new hair products, no new makeups.   She states for the last month, she will be walking, feels that she is swaying/moving and find herself veering to the left. She does feel the motion of movement at that time but denies any sensation of movement with getting out of bed, standing, turning head or sudden movement.  No falls, no dizziness. Some sinus congestion/fullness.  She denies any associated neurological complications or symptoms, such as one-sided weakness, numbness, tingling, slurring of speech, droopy face, swallowing difficulties, diplopia, vision loss, hearing loss or tinnitus.    Blood pressure 126/88, pulse 96, temperature (!) 97.5 F (36.4 C), height 5' 4.5" (1.638 m), weight 161 lb (73 kg), SpO2 98 %.  Medications  Current Outpatient Medications (Endocrine & Metabolic):  .  levothyroxine (SYNTHROID) 150 MCG tablet, TAKE 1 TABLET DAILY ON AN EMPTY STOMACH WITH ONLY WATER FOR 30 MINUTES & NO ANTACID,CALCIUM, MAGNESIUM, AVOID BIOTIN .  predniSONE (DELTASONE) 20 MG tablet, 1 tablet daily for 3 days.  Current Outpatient Medications (Cardiovascular):  .  atorvastatin (LIPITOR) 40 MG tablet, TAKE 1 TABLET BY MOUTH EVERY DAY  Current Outpatient Medications (Respiratory):   .  flunisolide (NASALIDE) 25 MCG/ACT (0.025%) SOLN, Place 2 sprays into the nose 2 (two) times daily.  Current Outpatient Medications (Analgesics):  .  meloxicam (MOBIC) 15 MG tablet, Take one daily with food for 2 weeks, can take with tylenol, can not take with aleve, iburpofen, then as needed daily for pain  Current Outpatient Medications (Hematological):  .  vitamin B-12 (CYANOCOBALAMIN) 100 MCG tablet, Take 100 mcg by mouth daily.  Current Outpatient Medications (Other):  Marland Kitchen  Cholecalciferol (VITAMIN D3) 5000 UNITS TABS, Take 5,000 Units by mouth daily. .  cyclobenzaprine (FLEXERIL) 10 MG tablet, Take 1 tablet (10 mg total) by mouth 3 (three) times daily as needed for muscle spasms. .  Multiple Vitamins-Minerals (HAIR/SKIN/NAILS PO), Take by mouth daily. .  Pyridoxine HCl (VITAMIN B6) 200 MG TABS, Take by mouth daily. .  meclizine (ANTIVERT) 25 MG tablet, 1/2-1 pill up to 3 times daily for motion sickness/dizziness  Problem list She has Hyperlipidemia; Abnormal MRI of head; Osteoarthritis; Colon polyp; Abnormal glucose; Obesity (BMI 30.0-34.9); Osteopenia; Hypothyroidism; Essential hypertension; and Mild memory disturbance on their problem list.  Review of Systems See HPI    Objective:   Physical Exam HENT:     Head: Normocephalic and atraumatic.     Right Ear: Tympanic membrane and ear canal normal.     Left Ear: Tympanic membrane and ear canal normal.     Nose: Rhinorrhea present.     Mouth/Throat:     Mouth: Mucous membranes are moist.     Pharynx: Oropharynx is clear.  No oropharyngeal exudate.  Eyes:     General: Lids are normal. Vision grossly intact. No visual field deficit.    Extraocular Movements:     Right eye: No nystagmus.     Left eye: No nystagmus.     Conjunctiva/sclera:     Right eye: Right conjunctiva is injected. No exudate or hemorrhage.    Left eye: Left conjunctiva is injected. No exudate or hemorrhage.    Pupils: Pupils are equal, round, and reactive  to light.  Cardiovascular:     Rate and Rhythm: Normal rate and regular rhythm.     Pulses: Normal pulses.     Heart sounds: Normal heart sounds. No murmur heard.   Pulmonary:     Effort: Pulmonary effort is normal.     Breath sounds: Normal breath sounds.  Abdominal:     General: Bowel sounds are normal.     Palpations: Abdomen is soft.     Tenderness: There is no abdominal tenderness.  Musculoskeletal:        General: No swelling, tenderness, deformity or signs of injury. Normal range of motion.     Right lower leg: No edema.     Left lower leg: No edema.  Skin:    General: Skin is warm and dry.     Findings: No rash.  Neurological:     General: No focal deficit present.     Mental Status: She is alert and oriented to person, place, and time.     Cranial Nerves: Cranial nerves are intact. No cranial nerve deficit, dysarthria or facial asymmetry.     Motor: No weakness, tremor or pronator drift.     Coordination: Romberg sign negative. Finger-Nose-Finger Test and Heel to [facility] Test normal.     Gait: Gait normal.  Psychiatric:        Mood and Affect: Mood normal.        Behavior: Behavior normal.        Assessment & Plan:    Essential hypertension -     CBC with Differential/Platelet -     COMPLETE METABOLIC PANEL WITH GFR -     TSH  Abnormal MRI of head -     Ambulatory referral to Neurology  Medication management -     Magnesium  Dry eyes Rule out autoimmune Use the eye drops 3-4 x a day Follow up with eye doctor -     Sjogrens syndrome-A extractable nuclear antibody -     Sjogrens syndrome-B extractable nuclear antibody -     predniSONE (DELTASONE) 20 MG tablet; 1 tablet daily for 3 days. -    mometasone (NASONEX) 50 MCG/ACT nasal spray; Place 2 sprays into the nose daily. -     meclizine (ANTIVERT) 25 MG tablet; 1/2-1 pill up to 3 times daily for motion sickness/dizziness  Abnormal gait Normal neuro exam in the office ? Vertigo with description will send  in meclizine Will refer back to neuro with history of abnormal MRI Declines any imaging and prefers to wait to see neuro ER precautions discussed with patient and will do close follow up Check labs -     meclizine (ANTIVERT) 25 MG tablet; 1/2-1 pill up to 3 times daily for motion sickness/dizziness -     Ambulatory referral to Neurology

## 2019-10-30 ENCOUNTER — Ambulatory Visit: Payer: 59 | Admitting: Physician Assistant

## 2019-10-30 ENCOUNTER — Encounter: Payer: Self-pay | Admitting: Physician Assistant

## 2019-10-30 ENCOUNTER — Other Ambulatory Visit: Payer: Self-pay | Admitting: Physician Assistant

## 2019-10-30 ENCOUNTER — Other Ambulatory Visit: Payer: Self-pay

## 2019-10-30 VITALS — BP 126/88 | HR 96 | Temp 97.5°F | Ht 64.5 in | Wt 161.0 lb

## 2019-10-30 DIAGNOSIS — R269 Unspecified abnormalities of gait and mobility: Secondary | ICD-10-CM

## 2019-10-30 DIAGNOSIS — Z79899 Other long term (current) drug therapy: Secondary | ICD-10-CM

## 2019-10-30 DIAGNOSIS — I10 Essential (primary) hypertension: Secondary | ICD-10-CM | POA: Diagnosis not present

## 2019-10-30 DIAGNOSIS — H04123 Dry eye syndrome of bilateral lacrimal glands: Secondary | ICD-10-CM | POA: Diagnosis not present

## 2019-10-30 DIAGNOSIS — R930 Abnormal findings on diagnostic imaging of skull and head, not elsewhere classified: Secondary | ICD-10-CM

## 2019-10-30 MED ORDER — PREDNISONE 20 MG PO TABS: ORAL_TABLET | ORAL | 0 refills | Status: DC

## 2019-10-30 MED ORDER — MECLIZINE HCL 25 MG PO TABS: ORAL_TABLET | ORAL | 0 refills | Status: DC

## 2019-10-30 MED ORDER — MOMETASONE FUROATE 50 MCG/ACT NA SUSP: 2.0000 | Freq: Every day | NASAL | 2 refills | Status: DC

## 2019-10-30 NOTE — Patient Instructions (Addendum)
Use over the counter systein eye drops or the refresh eye drops every 1-2 hours Can use the allergy eye drop or the lumininfy twice a day  She was informed to call 911 if she develop any new symptoms such as worsening headaches, episodes of blurred vision, double vision or complete loss of vision or speech difficulties or motor weakness.  Take the meclizine at night Take the prednisone for 3 days  Can do a steroid nasal spary 1-2 sparys at night each nostril.  Remember to spray each nostril twice towards the outer part of your eye.   Do not sniff but instead pinch your nose and tilt your head back to help the medicine get into your sinuses.   The best time to do this is at bedtime.  Stop if you get blurred vision or nose bleeds.   THIS WILL TAKE 7 DAYS TO WORK AND IS BETTER IF YOU START BEFORE SYMPTOMS SO IF YOU HAVE A SEASON OR TIME OF THE YEAR YOU ALWAYS GET A COLD, START BEFORE THAT!   Vertigo Vertigo is the feeling that you or the things around you are moving when they are not. This feeling can come and go at any time. Vertigo often goes away on its own. This condition can be dangerous if it happens when you are doing activities like driving or working with machines. Your doctor will do tests to find the cause of your vertigo. These tests will also help your doctor decide on the best treatment for you. Follow these instructions at home: Eating and drinking      Drink enough fluid to keep your pee (urine) pale yellow.  Do not drink alcohol. Activity  Return to your normal activities as told by your doctor. Ask your doctor what activities are safe for you.  In the morning, first sit up on the side of the bed. When you feel okay, stand slowly while you hold onto something until you know that your balance is fine.  Move slowly. Avoid sudden body or head movements or certain positions, as told by your doctor.  Use a cane if you have trouble standing or walking.  Sit down right  away if you feel dizzy.  Avoid doing any tasks or activities that can cause danger to you or others if you get dizzy.  Avoid bending down if you feel dizzy. Place items in your home so that they are easy for you to reach without leaning over.  Do not drive or use heavy machinery if you feel dizzy. General instructions  Take over-the-counter and prescription medicines only as told by your doctor.  Keep all follow-up visits as told by your doctor. This is important. Contact a doctor if:  Your medicine does not help your vertigo.  You have a fever.  Your problems get worse or you have new symptoms.  Your family or friends see changes in your behavior.  The feeling of being sick to your stomach gets worse.  Your vomiting gets worse.  You lose feeling (have numbness) in part of your body.  You feel prickling and tingling in a part of your body. Get help right away if:  You have trouble moving or talking.  You are always dizzy.  You pass out (faint).  You get very bad headaches.  You feel weak in your hands, arms, or legs.  You have changes in your hearing.  You have changes in how you see (vision).  You get a stiff neck.  Bright light starts to bother you. Summary  Vertigo is the feeling that you or the things around you are moving when they are not.  Your doctor will do tests to find the cause of your vertigo.  You may be told to avoid some tasks, positions, or movements.  Contact a doctor if your medicine is not helping, or if you have a fever, new symptoms, or a change in behavior.  Get help right away if you get very bad headaches, or if you have changes in how you speak, hear, or see. This information is not intended to replace advice given to you by your health care provider. Make sure you discuss any questions you have with your health care provider. Document Revised: 02/19/2018 Document Reviewed: 02/19/2018 Elsevier Patient Education  2020 [employer].

## 2019-10-31 LAB — CBC WITH DIFFERENTIAL/PLATELET
Absolute Monocytes: 645 cells/uL (ref 200–950)
Basophils Absolute: 43 cells/uL (ref 0–200)
Basophils Relative: 0.5 %
Eosinophils Absolute: 86 cells/uL (ref 15–500)
Eosinophils Relative: 1 %
HCT: 36.9 % (ref 35.0–45.0)
Hemoglobin: 12.6 g/dL (ref 11.7–15.5)
Lymphs Abs: 4352 cells/uL — ABNORMAL HIGH (ref 850–3900)
MCH: 29.2 pg (ref 27.0–33.0)
MCHC: 34.1 g/dL (ref 32.0–36.0)
MCV: 85.6 fL (ref 80.0–100.0)
MPV: 11.1 fL (ref 7.5–12.5)
Monocytes Relative: 7.5 %
Neutro Abs: 3474 cells/uL (ref 1500–7800)
Neutrophils Relative %: 40.4 %
Platelets: 288 10*3/uL (ref 140–400)
RBC: 4.31 10*6/uL (ref 3.80–5.10)
RDW: 13.2 % (ref 11.0–15.0)
Total Lymphocyte: 50.6 %
WBC: 8.6 10*3/uL (ref 3.8–10.8)

## 2019-10-31 LAB — COMPLETE METABOLIC PANEL WITH GFR
AG Ratio: 1.6 (calc) (ref 1.0–2.5)
ALT: 35 U/L — ABNORMAL HIGH (ref 6–29)
AST: 21 U/L (ref 10–35)
Albumin: 4.6 g/dL (ref 3.6–5.1)
Alkaline phosphatase (APISO): 56 U/L (ref 37–153)
BUN/Creatinine Ratio: 10 (calc) (ref 6–22)
BUN: 12 mg/dL (ref 7–25)
CO2: 31 mmol/L (ref 20–32)
Calcium: 9.7 mg/dL (ref 8.6–10.4)
Chloride: 104 mmol/L (ref 98–110)
Creat: 1.22 mg/dL — ABNORMAL HIGH (ref 0.50–0.99)
GFR, Est African American: 56 mL/min/{1.73_m2} — ABNORMAL LOW (ref >=60)
GFR, Est Non African American: 48 mL/min/{1.73_m2} — ABNORMAL LOW (ref >=60)
Globulin: 2.8 g/dL (calc) (ref 1.9–3.7)
Glucose, Bld: 88 mg/dL (ref 65–99)
Potassium: 4.5 mmol/L (ref 3.5–5.3)
Sodium: 140 mmol/L (ref 135–146)
Total Bilirubin: 0.6 mg/dL (ref 0.2–1.2)
Total Protein: 7.4 g/dL (ref 6.1–8.1)

## 2019-10-31 LAB — SJOGRENS SYNDROME-A EXTRACTABLE NUCLEAR ANTIBODY: SSA (Ro) (ENA) Antibody, IgG: <1 AI

## 2019-10-31 LAB — SJOGRENS SYNDROME-B EXTRACTABLE NUCLEAR ANTIBODY: SSB (La) (ENA) Antibody, IgG: <1 AI

## 2019-10-31 LAB — MAGNESIUM: Magnesium: 2.2 mg/dL (ref 1.5–2.5)

## 2019-10-31 LAB — TSH: TSH: 0.23 mIU/L — ABNORMAL LOW (ref 0.40–4.50)

## 2019-11-06 ENCOUNTER — Ambulatory Visit: Payer: 59 | Admitting: Physician Assistant

## 2019-11-12 ENCOUNTER — Other Ambulatory Visit: Payer: Self-pay | Admitting: Physician Assistant

## 2019-11-12 DIAGNOSIS — Z1231 Encounter for screening mammogram for malignant neoplasm of breast: Secondary | ICD-10-CM

## 2019-11-18 ENCOUNTER — Ambulatory Visit: Payer: 59

## 2019-11-26 ENCOUNTER — Ambulatory Visit: Payer: 59

## 2019-11-28 ENCOUNTER — Other Ambulatory Visit: Payer: Self-pay

## 2019-11-28 ENCOUNTER — Ambulatory Visit: Payer: 59 | Admitting: Physician Assistant

## 2019-11-28 ENCOUNTER — Ambulatory Visit
Admission: RE | Admit: 2019-11-28 | Discharge: 2019-11-28 | Disposition: A | Discharge status: Home / Self Care | Payer: 59 | Source: Ambulatory Visit | Attending: Physician Assistant | Admitting: Physician Assistant

## 2019-11-28 ENCOUNTER — Encounter: Payer: Self-pay | Admitting: Physician Assistant

## 2019-11-28 VITALS — BP 126/78 | HR 82 | Temp 97.3°F | Ht 64.5 in | Wt 164.0 lb

## 2019-11-28 DIAGNOSIS — R7309 Other abnormal glucose: Secondary | ICD-10-CM | POA: Diagnosis not present

## 2019-11-28 DIAGNOSIS — E039 Hypothyroidism, unspecified: Secondary | ICD-10-CM

## 2019-11-28 DIAGNOSIS — E785 Hyperlipidemia, unspecified: Secondary | ICD-10-CM

## 2019-11-28 DIAGNOSIS — R930 Abnormal findings on diagnostic imaging of skull and head, not elsewhere classified: Secondary | ICD-10-CM

## 2019-11-28 DIAGNOSIS — I10 Essential (primary) hypertension: Secondary | ICD-10-CM

## 2019-11-28 DIAGNOSIS — R269 Unspecified abnormalities of gait and mobility: Secondary | ICD-10-CM

## 2019-11-28 DIAGNOSIS — Z1231 Encounter for screening mammogram for malignant neoplasm of breast: Secondary | ICD-10-CM

## 2019-11-28 NOTE — Progress Notes (Signed)
Subjective:    Patient ID: Vicki Wright, female    DOB: 03-05-1961, 59 y.o.   MRN: [id number]  HPI 59 y.o. AAF with history of chol, preDM, hypothyroidism, HTN, abnormal MRI and some memory issues following with Dr. Terrace Arabia.  Presented 2-4 weeks ago with dry eyes and abnormal gait.   She had a negative sjogrens and was treated with eye drops- this has improved but came back. She has eye doctor appointment in Sept. No blurry vision, no eye pain. She will go get more lumify.   She was describing vertigo last visit, normal exam but described some nasal congestion, given prednisone/flonase and meclizine. Due to her history of abnormal MRI, she was also referred to neuro follow up with strict ER precautions.   She presents today for follow up. States the vertigo has completely resolved. She is not having any more issues.  She states she did have a tick bite several months ago that she had forgotten about, was on for 24 hours or longer, did leave a mark. Some joint pain, memory issues since that time worsening.   Her blood pressure has been controlled at home, today their BP is BP: 126/78  She does not workout. She denies chest pain, shortness of breath, dizziness.  She is not on cholesterol medication and denies myalgias. Her cholesterol is not at goal. The cholesterol last visit was:   Lab Results  Component Value Date   CHOL 170 04/24/2019   HDL 43 (L) 04/24/2019   LDLCALC 107 (H) 04/24/2019   TRIG 108 04/24/2019   CHOLHDL 4.0 04/24/2019    She has been working on diet and exercise for prediabetes, and denies paresthesia of the feet, polydipsia and polyuria. Last A1C in the office was:  Lab Results  Component Value Date   HGBA1C 6.4 (H) 04/24/2019   Patient is on Vitamin D supplement.   Lab Results  Component Value Date   VD25OH 44 04/24/2019     She is on thyroid medication. Her medication was changed last visit, Monday-Thursday she is taking 150 but none Friday, Saturday, Sunday.  She was on biotin last time, has been off..   Lab Results  Component Value Date   TSH 0.23 (L) 10/30/2019  .   Blood pressure 126/78, pulse 82, temperature (!) 97.3 F (36.3 C), height 5' 4.5" (1.638 m), weight 164 lb (74.4 kg), SpO2 95 %.  Medications  Current Outpatient Medications (Endocrine & Metabolic):  .  levothyroxine (SYNTHROID) 150 MCG tablet, TAKE 1 TABLET DAILY ON AN EMPTY STOMACH WITH ONLY WATER FOR 30 MINUTES & NO ANTACID,CALCIUM, MAGNESIUM, AVOID BIOTIN  Current Outpatient Medications (Cardiovascular):  .  atorvastatin (LIPITOR) 40 MG tablet, TAKE 1 TABLET BY MOUTH EVERY DAY  Current Outpatient Medications (Respiratory):  .  flunisolide (NASALIDE) 25 MCG/ACT (0.025%) SOLN, Place 2 sprays into the nose 2 (two) times daily.  Current Outpatient Medications (Analgesics):  .  meloxicam (MOBIC) 15 MG tablet, Take one daily with food for 2 weeks, can take with tylenol, can not take with aleve, iburpofen, then as needed daily for pain  Current Outpatient Medications (Hematological):  .  vitamin B-12 (CYANOCOBALAMIN) 100 MCG tablet, Take 100 mcg by mouth daily.  Current Outpatient Medications (Other):  Marland Kitchen  Cholecalciferol (VITAMIN D3) 5000 UNITS TABS, Take 5,000 Units by mouth daily. .  cyclobenzaprine (FLEXERIL) 10 MG tablet, Take 1 tablet (10 mg total) by mouth 3 (three) times daily as needed for muscle spasms. .  meclizine (ANTIVERT) 25  MG tablet, 1/2-1 pill up to 3 times daily for motion sickness/dizziness .  Multiple Vitamins-Minerals (HAIR/SKIN/NAILS PO), Take by mouth daily. .  Pyridoxine HCl (VITAMIN B6) 200 MG TABS, Take by mouth daily.  Problem list She has Hyperlipidemia; Abnormal MRI of head; Osteoarthritis; Colon polyp; Abnormal glucose; Obesity (BMI 30.0-34.9); Osteopenia; Hypothyroidism; Essential hypertension; and Mild memory disturbance on their problem list.  Review of Systems See HPI    Objective:   Physical Exam HENT:     Head: Normocephalic and  atraumatic.     Right Ear: Tympanic membrane and ear canal normal.     Left Ear: Tympanic membrane and ear canal normal.     Nose: Rhinorrhea present.     Mouth/Throat:     Mouth: Mucous membranes are moist.     Pharynx: Oropharynx is clear. No oropharyngeal exudate.  Eyes:     General: Lids are normal. Vision grossly intact. No visual field deficit.    Extraocular Movements:     Right eye: No nystagmus.     Left eye: No nystagmus.     Conjunctiva/sclera:     Right eye: Right conjunctiva is injected. No exudate or hemorrhage.    Left eye: Left conjunctiva is injected. No exudate or hemorrhage.    Pupils: Pupils are equal, round, and reactive to light.  Cardiovascular:     Rate and Rhythm: Normal rate and regular rhythm.     Pulses: Normal pulses.     Heart sounds: Normal heart sounds. No murmur heard.   Pulmonary:     Effort: Pulmonary effort is normal.     Breath sounds: Normal breath sounds.  Abdominal:     General: Bowel sounds are normal.     Palpations: Abdomen is soft.     Tenderness: There is no abdominal tenderness.  Musculoskeletal:        General: No swelling, tenderness, deformity or signs of injury. Normal range of motion.     Right lower leg: No edema.     Left lower leg: No edema.  Skin:    General: Skin is warm and dry.     Findings: No rash.  Neurological:     General: No focal deficit present.     Mental Status: She is alert and oriented to person, place, and time.     Cranial Nerves: Cranial nerves are intact. No cranial nerve deficit, dysarthria or facial asymmetry.     Motor: No weakness, tremor or pronator drift.     Coordination: Romberg sign negative. Finger-Nose-Finger Test and Heel to [facility] Test normal.     Gait: Gait normal.  Psychiatric:        Mood and Affect: Mood normal.        Behavior: Behavior normal.        Assessment & Plan:   Abnormal gait Has improved but with previous tick exposure will check -     B. burgdorfi antibodies -      Rocky mtn spotted fvr abs pnl(IgG+IgM)  Abnormal MRI of head Follow up neuro  Hyperlipidemia, unspecified hyperlipidemia type -     Lipid panel check lipids decrease fatty foods increase activity.   Abnormal glucose -     Hemoglobin A1c Discussed disease progression and risks Discussed diet/exercise, weight management and risk modification  Hypothyroidism, unspecified type -     TSH Hypothyroidism-check TSH level, continue medications the same, reminded to take on an empty stomach 30-45mins before food.   Essential hypertension -  CBC with Differential/Platelet -     COMPLETE METABOLIC PANEL WITH GFR - continue medications, DASH diet, exercise and monitor at home. Call if greater than 130/80.

## 2019-11-29 LAB — COMPLETE METABOLIC PANEL WITH GFR
AG Ratio: 1.9 (calc) (ref 1.0–2.5)
ALT: 41 U/L — ABNORMAL HIGH (ref 6–29)
AST: 24 U/L (ref 10–35)
Albumin: 4.5 g/dL (ref 3.6–5.1)
Alkaline phosphatase (APISO): 54 U/L (ref 37–153)
BUN/Creatinine Ratio: 12 (calc) (ref 6–22)
BUN: 13 mg/dL (ref 7–25)
CO2: 30 mmol/L (ref 20–32)
Calcium: 9.8 mg/dL (ref 8.6–10.4)
Chloride: 104 mmol/L (ref 98–110)
Creat: 1.1 mg/dL — ABNORMAL HIGH (ref 0.50–0.99)
GFR, Est African American: 63 mL/min/{1.73_m2} (ref >=60)
GFR, Est Non African American: 55 mL/min/{1.73_m2} — ABNORMAL LOW (ref >=60)
Globulin: 2.4 g/dL (calc) (ref 1.9–3.7)
Glucose, Bld: 124 mg/dL — ABNORMAL HIGH (ref 65–99)
Potassium: 4.1 mmol/L (ref 3.5–5.3)
Sodium: 140 mmol/L (ref 135–146)
Total Bilirubin: 0.5 mg/dL (ref 0.2–1.2)
Total Protein: 6.9 g/dL (ref 6.1–8.1)

## 2019-11-29 LAB — CBC WITH DIFFERENTIAL/PLATELET
Absolute Monocytes: 555 cells/uL (ref 200–950)
Basophils Absolute: 30 cells/uL (ref 0–200)
Basophils Relative: 0.4 %
Eosinophils Absolute: 68 cells/uL (ref 15–500)
Eosinophils Relative: 0.9 %
HCT: 36.6 % (ref 35.0–45.0)
Hemoglobin: 12.3 g/dL (ref 11.7–15.5)
Lymphs Abs: 3458 cells/uL (ref 850–3900)
MCH: 28.8 pg (ref 27.0–33.0)
MCHC: 33.6 g/dL (ref 32.0–36.0)
MCV: 85.7 fL (ref 80.0–100.0)
MPV: 11.1 fL (ref 7.5–12.5)
Monocytes Relative: 7.4 %
Neutro Abs: 3390 cells/uL (ref 1500–7800)
Neutrophils Relative %: 45.2 %
Platelets: 288 10*3/uL (ref 140–400)
RBC: 4.27 10*6/uL (ref 3.80–5.10)
RDW: 13.6 % (ref 11.0–15.0)
Total Lymphocyte: 46.1 %
WBC: 7.5 10*3/uL (ref 3.8–10.8)

## 2019-11-29 LAB — ROCKY MTN SPOTTED FVR ABS PNL(IGG+IGM)
RMSF IgG: NOT DETECTED
RMSF IgM: NOT DETECTED

## 2019-11-29 LAB — LIPID PANEL
Cholesterol: 147 mg/dL (ref <200)
HDL: 40 mg/dL — ABNORMAL LOW (ref >=50)
LDL Cholesterol (Calc): 76 mg/dL (calc)
Non-HDL Cholesterol (Calc): 107 mg/dL (calc) (ref <130)
Total CHOL/HDL Ratio: 3.7 (calc) (ref <5.0)
Triglycerides: 222 mg/dL — ABNORMAL HIGH (ref <150)

## 2019-11-29 LAB — HEMOGLOBIN A1C
Hgb A1c MFr Bld: 6.3 % of total Hgb — ABNORMAL HIGH (ref <5.7)
Mean Plasma Glucose: 134 (calc)
eAG (mmol/L): 7.4 (calc)

## 2019-11-29 LAB — B. BURGDORFI ANTIBODIES: B burgdorferi Ab IgG+IgM: <0.9 index

## 2019-11-29 LAB — TSH: TSH: 0.58 mIU/L (ref 0.40–4.50)

## 2019-12-19 ENCOUNTER — Other Ambulatory Visit: Payer: 59

## 2019-12-19 ENCOUNTER — Ambulatory Visit: Payer: 59

## 2019-12-23 ENCOUNTER — Other Ambulatory Visit: Payer: Self-pay | Admitting: Physician Assistant

## 2019-12-23 DIAGNOSIS — E039 Hypothyroidism, unspecified: Secondary | ICD-10-CM

## 2020-01-08 ENCOUNTER — Encounter: Payer: Self-pay | Admitting: *Deleted

## 2020-01-09 ENCOUNTER — Encounter: Payer: Self-pay | Admitting: Neurology

## 2020-01-09 ENCOUNTER — Ambulatory Visit (INDEPENDENT_AMBULATORY_CARE_PROVIDER_SITE_OTHER): Payer: 59 | Admitting: Neurology

## 2020-01-09 VITALS — BP 118/71 | HR 80 | Ht 64.5 in | Wt 185.0 lb

## 2020-01-09 DIAGNOSIS — H5789 Other specified disorders of eye and adnexa: Secondary | ICD-10-CM | POA: Diagnosis not present

## 2020-01-09 DIAGNOSIS — R413 Other amnesia: Secondary | ICD-10-CM

## 2020-01-09 NOTE — Progress Notes (Signed)
Chief Complaint  Patient presents with   Consult    MMSE 30/30 - 10 animals. Feels her memory is stable but felt it needed to be re-checked.  Last seen 09/18/2017 for mild cognitive impairment. She is also concerned about issues with redness in her eyes. She is being seen by Vicki Wright. Prescribed drops have helped.    PCP    Vicki Mutters, Vicki-C    HISTORICAL  Vicki Wright, seen in request by  I reviewed and summarized the referring note.   Vicki Wright is a 59 year old female, seen in refer by primary care Vicki Wright, Vicki Wright, for evaluation of abnormal MRI of the brain.  initial evaluation was on June 06, 2017.  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 No enhancement is noted. There mild changes of chronic microvascular ischemia and chronic paranasal sinusitis. Overall no significant change compared with MRI dated 02/26/2013.  Lab showed normal or negative, ANA, CRP, ESR, RPR, B12  UPDATE Sept 30 2021: She is going to retire in January 2022, she is overall doing well, continues to have intermittent memory concerns,  Since summer 2021, she noticed bilateral dry eye, red eye, was seen by ophthalmologist, laboratory evaluation showed normal or negative [facility] spotted fever, Lyme titer, TSH,A1c 6.3, lipid  panel triglyceride 222, LDL 76 normal CMP, creatinine of 1.1, normal CBC, hemoglobin of 12.3, negative SSA, SSB, TSH,  We again personally reviewed MRI of the brain March 2019, no acute abnormality, 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  ALLERGIES: Allergies  Allergen Reactions   Cephalexin     Throat swelling    HOME MEDICATIONS: Current Outpatient Medications  Medication Sig Dispense Refill   atorvastatin (LIPITOR) 40 MG tablet TAKE 1 TABLET BY MOUTH EVERY DAY 30 tablet 11   Cholecalciferol (VITAMIN D3) 5000 UNITS TABS Take 5,000 Units by mouth daily.     levothyroxine (SYNTHROID) 150 MCG tablet TAKE 1 TABLET DAILY ON AN EMPTY STOMACH WITH ONLY WATER FOR 30 MINUTES & NO ANTACID,CALCIUM, MAGNESIUM, AVOID BIOTIN 30 tablet 2   Multiple Vitamins-Minerals (HAIR/SKIN/NAILS PO) Take by mouth daily.     vitamin B-12 (CYANOCOBALAMIN) 100 MCG tablet Take 100 mcg by mouth daily.     No current facility-administered medications for this visit.    PAST MEDICAL HISTORY: Past Medical History:  Diagnosis Date   Abnormal MRI    Colon polyp    Hot flashes    Hypercholesteremia    Hypothyroid    Memory loss    Osteoarthritis     PAST SURGICAL HISTORY: Past Surgical History:  Procedure Laterality Date   CESAREAN SECTION  80 & 82   VAGINAL  HYSTERECTOMY  2002    FAMILY HISTORY: Family History  Problem Relation Age of Onset   Cancer Mother    Hypertension Mother    Heart disease Mother    Diabetes Mother    Hypertension Father    Heart disease Father    Diabetes Father    Cancer Maternal Aunt    Breast cancer Maternal Aunt    Cancer Paternal Aunt    Cancer Maternal Aunt    Breast cancer Maternal Aunt    Cancer Paternal Aunt    Cancer Paternal Aunt    Cancer Paternal Aunt     SOCIAL  HISTORY: Social History   Socioeconomic History   Marital status: Divorced    Spouse name: Not on file   Number of children: 2   Years of education: 12   Highest education level: Not on file  Occupational History    Comment: [employer]  Tobacco Use   Smoking status: Never Smoker   Smokeless tobacco: Never Used  Scientific laboratory technician Use: Never used  Substance and Sexual Activity   Alcohol use: No   Drug use: No   Sexual activity: Not on file  Other Topics Concern   Not on file  Social History Narrative   Patient is single and her daughter and family live with her.   Patient is working full-time.   Patient has a high school education.   Patient has two children.   Patient is left-handed.   Patient does drink maybe one coke once a week.   Social Determinants of Health   Financial Resource Strain:    Difficulty of Paying Living Expenses: Not on file  Food Insecurity:    Worried About Charity fundraiser in the Last Year: Not on file   [employer] of Food in the Last Year: Not on file  Transportation Needs:    Lack of Transportation (Medical): Not on file   Lack of Transportation (Non-Medical): Not on file  Physical Activity:    Days of Exercise per Week: Not on file   Minutes of Exercise per Session: Not on file  Stress:    Feeling of Stress : Not on file  Social Connections:    Frequency of Communication with Friends and Family: Not on file   Frequency of Social Gatherings with Friends and Family: Not on file   Attends Religious Services: Not on file   Active Member of Clubs or Organizations: Not on file   Attends Archivist Meetings: Not on file   Marital Status: Not on file  Intimate Partner Violence:    Fear of Current or Ex-Partner: Not on file   Emotionally Abused: Not on file   Physically Abused: Not on file   Sexually Abused: Not on file     PHYSICAL EXAM   Vitals:   01/09/20 1453  BP: 118/71  Pulse: 80   Weight: 185 lb (83.9 kg)  Height: 5' 4.5" (1.638 m)   Not recorded     Body mass index is 31.26 kg/m.  PHYSICAL EXAMNIATION:  Gen: NAD, conversant, well nourised, well groomed                     Cardiovascular: Regular rate rhythm, no peripheral edema, warm, nontender. Eyes: Conjunctivae clear without exudates or hemorrhage Neck: Supple, no carotid bruits. Pulmonary: Clear to auscultation bilaterally   NEUROLOGICAL EXAM:  MENTAL STATUS: MMSE - Mini Mental State Exam 01/09/2020 06/06/2017  Orientation to  time 5 5  Orientation to Place 5 5  Registration 3 2  Attention/ Calculation 5 5  Recall 3 3  Language- name 2 objects 2 2  Language- repeat 1 1  Language- follow 3 step command 3 3  Language- read & follow direction 1 1  Write a sentence 1 1  Copy design 1 1  Total score 30 29   CRANIAL NERVES: CN II: Visual fields are full to confrontation. Pupils are round equal and briskly reactive to light. CN III, IV, VI: extraocular movement are normal. No ptosis. CN V: Facial sensation is intact to light touch CN VII: Face is symmetric with normal eye closure  CN VIII: Hearing is normal to causal conversation. CN IX, X: Phonation is normal. CN XI: Head turning and shoulder shrug are intact  MOTOR: There is no pronator drift of out-stretched arms. Muscle bulk and tone are normal. Muscle strength is normal.  REFLEXES: Reflexes are 2+ and symmetric at the biceps, triceps, knees, and ankles. Plantar responses are flexor.  SENSORY: Intact to light touch, pinprick and vibratory sensation are intact in fingers and toes.  COORDINATION: There is no trunk or limb dysmetria noted.  GAIT/STANCE: Posture is normal. Gait is steady with normal steps, base, arm swing, and turning. Heel and toe walking are normal. Tandem gait is normal.  Romberg is absent.   DIAGNOSTIC DATA (LABS, IMAGING, TESTING) - I reviewed patient records, labs, notes, testing and imaging myself where  available.   ASSESSMENT AND PLAN  Vicki Wright is a 59 y.o. 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 Ph.D.  [facility] [address] [place], [place] [address] Ph: [phone] Fax: [phone]  CC:  Vicki Wright, [place],  [place] [address]  Vicki Mutters, Vicki-C

## 2020-01-13 LAB — HM DEXA SCAN

## 2020-01-22 ENCOUNTER — Other Ambulatory Visit: Payer: Self-pay | Admitting: Physician Assistant

## 2020-01-22 DIAGNOSIS — E785 Hyperlipidemia, unspecified: Secondary | ICD-10-CM

## 2020-02-13 ENCOUNTER — Other Ambulatory Visit: Payer: Self-pay

## 2020-02-13 ENCOUNTER — Ambulatory Visit
Admission: RE | Admit: 2020-02-13 | Discharge: 2020-02-13 | Disposition: A | Discharge status: Home / Self Care | Payer: 59 | Source: Ambulatory Visit | Attending: Physician Assistant | Admitting: Physician Assistant

## 2020-02-13 DIAGNOSIS — M8580 Other specified disorders of bone density and structure, unspecified site: Secondary | ICD-10-CM

## 2020-04-23 ENCOUNTER — Ambulatory Visit: Payer: 59 | Admitting: Adult Health

## 2020-04-23 ENCOUNTER — Other Ambulatory Visit: Payer: Self-pay

## 2020-04-23 ENCOUNTER — Encounter: Payer: Self-pay | Admitting: Adult Health

## 2020-04-23 VITALS — BP 132/82 | HR 63 | Temp 96.4°F | Ht 64.0 in | Wt 184.4 lb

## 2020-04-23 DIAGNOSIS — I10 Essential (primary) hypertension: Secondary | ICD-10-CM | POA: Diagnosis not present

## 2020-04-23 DIAGNOSIS — K635 Polyp of colon: Secondary | ICD-10-CM

## 2020-04-23 DIAGNOSIS — Z1389 Encounter for screening for other disorder: Secondary | ICD-10-CM

## 2020-04-23 DIAGNOSIS — E669 Obesity, unspecified: Secondary | ICD-10-CM

## 2020-04-23 DIAGNOSIS — Z8249 Family history of ischemic heart disease and other diseases of the circulatory system: Secondary | ICD-10-CM | POA: Diagnosis not present

## 2020-04-23 DIAGNOSIS — E785 Hyperlipidemia, unspecified: Secondary | ICD-10-CM

## 2020-04-23 DIAGNOSIS — R7309 Other abnormal glucose: Secondary | ICD-10-CM

## 2020-04-23 DIAGNOSIS — Z136 Encounter for screening for cardiovascular disorders: Secondary | ICD-10-CM

## 2020-04-23 DIAGNOSIS — M8580 Other specified disorders of bone density and structure, unspecified site: Secondary | ICD-10-CM

## 2020-04-23 DIAGNOSIS — E559 Vitamin D deficiency, unspecified: Secondary | ICD-10-CM

## 2020-04-23 DIAGNOSIS — R413 Other amnesia: Secondary | ICD-10-CM

## 2020-04-23 DIAGNOSIS — Z0000 Encounter for general adult medical examination without abnormal findings: Secondary | ICD-10-CM | POA: Diagnosis not present

## 2020-04-23 DIAGNOSIS — E039 Hypothyroidism, unspecified: Secondary | ICD-10-CM

## 2020-04-23 DIAGNOSIS — M1990 Unspecified osteoarthritis, unspecified site: Secondary | ICD-10-CM

## 2020-04-23 DIAGNOSIS — E538 Deficiency of other specified B group vitamins: Secondary | ICD-10-CM

## 2020-04-23 DIAGNOSIS — E66811 Obesity, class 1: Secondary | ICD-10-CM

## 2020-04-23 DIAGNOSIS — R079 Chest pain, unspecified: Secondary | ICD-10-CM

## 2020-04-23 DIAGNOSIS — Z0001 Encounter for general adult medical examination with abnormal findings: Secondary | ICD-10-CM

## 2020-04-23 NOTE — Patient Instructions (Addendum)
Vicki Wright , Thank you for taking time to come for your Annual Wellness Visit. I appreciate your ongoing commitment to your health goals. Please review the following plan we discussed and let me know if I can assist you in the future.   This is a list of the screening recommended for you and due dates:  Health Maintenance  Topic Date Due  . Pap Smear  03/24/2018  . Flu Shot  11/10/2019  . COVID-19 Vaccine (3 - Booster for Pfizer series) 03/24/2020  . Mammogram  11/27/2021  . Colon Cancer Screening  06/18/2023  . Tetanus Vaccine  04/11/2027  .  Hepatitis C: One time screening is recommended by Center for Disease Control  (CDC) for  adults born from [birth year] through 1967.   Completed  . HIV Screening  Completed      [employer] insurance about shingrix vaccines    Know what a healthy weight is for you (roughly BMI <25) and aim to maintain this  Aim for 7+ servings of fruits and vegetables daily  65-80+ fluid ounces of water or unsweet tea for healthy kidneys  Limit to max 1 drink of alcohol per day; avoid smoking/tobacco  Limit animal fats in diet for cholesterol and heart health - choose grass fed whenever available  Avoid highly processed foods, and foods high in saturated/trans fats  Aim for low stress - take time to unwind and care for your mental health  Aim for 150 min of moderate intensity exercise weekly for heart health, and weights twice weekly for bone health  Aim for 7-9 hours of sleep daily      Zoster Vaccine, Recombinant injection What is this medicine? ZOSTER VACCINE (ZOS ter vak SEEN) is a vaccine used to reduce the risk of getting shingles. This vaccine is not used to treat shingles or nerve pain from shingles. This medicine may be used for other purposes; ask your health care provider or pharmacist if you have questions. COMMON BRAND NAME(S): [facility] What should I tell my health care provider before I take this medicine? They need to know if you have any of  these conditions:  cancer  immune system problems  an unusual or allergic reaction to Zoster vaccine, other medications, foods, dyes, or preservatives  pregnant or trying to get pregnant  breast-feeding How should I use this medicine? This vaccine is injected into a muscle. It is given by a health care provider. A copy of Vaccine Information Statements will be given before each vaccination. Be sure to read this information carefully each time. This sheet may change often. Talk to your health care provider about the use of this vaccine in children. This vaccine is not approved for use in children. Overdosage: If you think you have taken too much of this medicine contact a poison control center or emergency room at once. NOTE: This medicine is only for you. Do not share this medicine with others. What if I miss a dose? Keep appointments for follow-up (booster) doses. It is important not to miss your dose. Call your health care provider if you are unable to keep an appointment. What may interact with this medicine?  medicines that suppress your immune system  medicines to treat cancer  steroid medicines like prednisone or cortisone This list may not describe all possible interactions. Give your health care provider a list of all the medicines, herbs, non-prescription drugs, or dietary supplements you use. Also tell them if you smoke, drink alcohol, or use illegal drugs. Some  items may interact with your medicine. What should I watch for while using this medicine? Visit your health care provider regularly. This vaccine, like all vaccines, may not fully protect everyone. What side effects may I notice from receiving this medicine? Side effects that you should report to your doctor or health care professional as soon as possible:  allergic reactions (skin rash, itching or hives; swelling of the face, lips, or tongue)  trouble breathing Side effects that usually do not require medical  attention (report these to your doctor or health care professional if they continue or are bothersome):  chills  headache  fever  nausea  pain, redness, or irritation at site where injected  tiredness  vomiting This list may not describe all possible side effects. Call your doctor for medical advice about side effects. You may report side effects to FDA at 1-800-FDA-1088. Where should I keep my medicine? This vaccine is only given by a health care provider. It will not be stored at home. NOTE: This sheet is a summary. It may not cover all possible information. If you have questions about this medicine, talk to your doctor, pharmacist, or health care provider.  2021 Elsevier/Gold Standard (2019-05-03 16:23:07)

## 2020-04-23 NOTE — Progress Notes (Signed)
Complete Physical  Assessment and Plan:  Encounter for general adult medical examination with abnormal findings 1 year Check about shingrix with insurance  Hypothyroidism, unspecified hypothyroidism type Hypothyroidism-check TSH level, continue medications the same, reminded to take on an empty stomach 30-56mins before food.  - TSH  Hyperlipidemia -continue medications, check lipids, decrease fatty foods, increase activity.  - CBC with Differential/Platelet - CMP/GFR - Lipid panel - EKG 12-Lead  Prediabetes Continue weight loss Discussed general issues about diabetes pathophysiology and management.  Agricultural engineer distributed.  Encouraged aerobic exercise. - Hemoglobin A1c  Osteopenia Will get at 63  Osteoarthritis, unspecified osteoarthritis type, unspecified site controlled   Colon polyp Next due 2025   Medication management - Magnesium  Vitamin D deficiency - VITAMIN D 25 Hydroxy (Vit-D Deficiency, Fractures)   Screening for blood or protein in urine - Urinalysis, Routine w reflex microscopic (not at [facility]) - Microalbumin / creatinine urine ratio  Abnormal MRI of head Continue follow up neuro; stable 5 year follow up in 2019  Chest pain/dyspnea Only with lifting, not consistent, not clearly cardiac etiology, however with maternal hx of MI, personal htn, hyperlipidemia, obesity hx will proceed with cardiology referral for possible perfusion study/stress test - unchanged EKG today   Discussed med's effects and SE's. Screening labs and tests as requested with regular follow-up as recommended. Over 40 minutes of exam, counseling, chart review, and complex, high level critical decision making was performed this visit.   Future Appointments  Date Time Provider Department Center  04/26/2021  3:00 PM [name], NP [facility] None     HPI  60 y.o. AA female  presents for a complete physical. She has Hyperlipidemia; Abnormal MRI of head;  Osteoarthritis; Colon polyp; Abnormal glucose; Obesity (BMI 30.0-34.9); Osteopenia; Hypothyroidism; Essential hypertension; and Mild memory disturbance on their problem list.  She is divorced, single and happy. 2 daughters, 3 grandchilren close by. She is retiring from [employer] center in 2 weeks, just wants to spend time with aging father.   She is concerned about some exertional chest pain and dyspnea, not with just walking but has if carrying anything. Very concerned due to maternal MI hx. Denies dizziness, neck/arm pain, fatigue, palpitations, edema.   No GYN, s/p TAH in 2002. Gets mmg at breast center. Has hot flashes but getting.    She has seen Dr. Terrace Arabia for memory disturbance, negative work up June 2017, normal B12, normal ferritin, negative HIV, RPR, she had a positive ANA but negative antiDNA.   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  No enhancement is noted.  There mild changes of chronic microvascular ischemia and chronic paranasal sinusitis.  Overall no significant change compared with MRI dated 02/26/2013.   BMI is Body mass index is 31.65 kg/m., she has been working on diet and exercise, walking daily.  Wt Readings from Last 3 Encounters:  04/23/20 184 lb 6.4 oz (83.6 kg)  01/09/20 185 lb (83.9 kg)  11/28/19 164 lb (74.4 kg)   Her blood pressure has been controlled at home, today their BP is BP: 132/82 She does workout, was walking daily at work until got cold. Concerned due to chest pain, dyspnea when walking with any sort of a load. Mother had several MI.   She is on cholesterol medication, lipitor 40mg  and denies myalgias. Her cholesterol is at goal. The cholesterol last visit was:  Lab Results  Component Value Date   CHOL 147 11/28/2019   HDL 40 (L) 11/28/2019   LDLCALC 76 11/28/2019   TRIG 222 (H) 11/28/2019   CHOLHDL 3.7  11/28/2019   She has been working on diet and exercise for prediabetes, and denies paresthesia of the feet, polydipsia, polyuria and visual disturbances.  Last A1C in the office was:  Lab Results  Component Value Date   HGBA1C 6.3 (H) 11/28/2019   Last GFR: Lab Results  Component Value Date   GFRAA 63 11/28/2019   Patient is on Vitamin D supplement, she is on 5000 units. Lab Results  Component Value Date   VD25OH 44 04/24/2019     She is on thyroid medication. Her medication was not changed last visit, she is on 1 pill Monday- Thursday, none on F,S,S. She is on a biotin supplement.  Lab Results  Component Value Date   TSH 0.58 11/28/2019   She is on B12 supplement;  Lab Results  Component Value Date   VITAMINB12 555 06/06/2017      Current Medications:   Current Outpatient Medications (Endocrine & Metabolic):  .  levothyroxine (SYNTHROID) 150 MCG tablet, TAKE 1 TABLET DAILY ON AN EMPTY STOMACH WITH ONLY WATER FOR 30 MINUTES & NO ANTACID,CALCIUM, MAGNESIUM, AVOID BIOTIN  Current Outpatient Medications (Cardiovascular):  .  atorvastatin (LIPITOR) 40 MG tablet, Take     1 tablet     Daily     for Cholesterol    Current Outpatient Medications (Hematological):  .  vitamin B-12 (CYANOCOBALAMIN) 100 MCG tablet, Take 100 mcg by mouth daily.  Current Outpatient Medications (Other):  Marland Kitchen  Cholecalciferol (VITAMIN D3) 5000 UNITS TABS, Take 5,000 Units by mouth daily. .  Multiple Vitamins-Minerals (HAIR/SKIN/NAILS PO), Take by mouth daily.  Health Maintenance:   Immunization History  Administered Date(s) Administered  . PFIZER SARS-COV-2 Vaccination 08/31/2019, 09/23/2019  . PPD Test 03/13/2014  . Tdap 04/11/2006, 04/10/2017   TDAP: 2018 Pneumovax: N/A Prevnar 13: N/A Flu vaccine: declines Shingles: ask insurance   LMP: Hysterectomy Pap: 2016 PAP with negative HPV - ? Done, doesn't think has cervix,  MGM: 11/2019 DEXA: 02/2020 osteopenia- T-1.3 Colonoscopy: 06/2018  5 year recall, family history  EGD: N/A  MRI brain 2019- abnormal but unchanged  Last eye: Dr. Farris Has, Dr. Dione Booze, last 2021, goes annually Last dental: Dr. Vladimir Crofts, last 2019, overdue   Patient Care Team: Lucky Cowboy, MD as PCP - General (Internal Medicine) Rollene Rotunda, MD as Consulting Physician (Cardiology) Charna Elizabeth, MD as Consulting Physician (Gastroenterology) Marcene Corning, MD as Consulting Physician (Orthopedic Surgery) Lucky Cowboy, MD as Referring Physician (Internal Medicine)  Medical History:  Past Medical History:  Diagnosis Date  . Abnormal MRI   . Colon polyp   . Hot flashes   . Hypercholesteremia   . Hypothyroid   . Memory loss   . Osteoarthritis    Allergies Allergies  Allergen Reactions  . Cephalexin     Throat swelling    SURGICAL HISTORY She  has a past surgical history that includes Cesarean section (80 & 82) and Vaginal hysterectomy (2002). FAMILY HISTORY Her family history includes Breast cancer in her maternal aunt, maternal aunt, and paternal aunt; Cancer in her mother, paternal aunt, and paternal aunt; Dementia in her father; Diabetes in her father and mother; Heart disease in her father and mother; Hypertension in her father and mother; Uterine cancer in her paternal aunt. SOCIAL HISTORY She  reports that she has never smoked. She has  never used smokeless tobacco. She reports that she does not drink alcohol and does not use drugs.  Review of Systems: Review of Systems  Constitutional: Negative for diaphoresis and malaise/fatigue.  HENT: Negative.   Eyes: Negative.   Respiratory: Positive for shortness of breath (exertional). Negative for cough, hemoptysis, sputum production and wheezing.   Cardiovascular: Positive for chest pain (exertional). Negative for palpitations, orthopnea, claudication, leg swelling and PND.  Gastrointestinal: Negative.   Genitourinary: Negative.   Musculoskeletal: Negative.   Skin: Negative.    Neurological: Negative.   Endo/Heme/Allergies: Negative.   Psychiatric/Behavioral: Negative.     Physical Exam: Estimated body mass index is 31.65 kg/m as calculated from the following:   Height as of this encounter: 5\' 4"  (1.626 m).   Weight as of this encounter: 184 lb 6.4 oz (83.6 kg). BP 132/82   Pulse 63   Temp (!) 96.4 F (35.8 C)   Ht 5\' 4"  (1.626 m)   Wt 184 lb 6.4 oz (83.6 kg)   SpO2 98%   BMI 31.65 kg/m  General Appearance: Well nourished, in no apparent distress.  Eyes: PERRLA, EOMs, conjunctiva no swelling or erythema Sinuses: No Frontal/maxillary tenderness  ENT/Mouth: Ext aud canals clear, normal light reflex with TMs without erythema, bulging. Good dentition. No erythema, swelling, or exudate on post pharynx. Tonsils not swollen or erythematous. Hearing normal.  Neck: Supple, thyroid normal. No bruits  Respiratory: Respiratory effort normal, BS equal bilaterally without rales, rhonchi, wheezing or stridor.  Cardio: RRR without murmurs, rubs or gallops. Brisk peripheral pulses without edema.  Chest: symmetric, with normal excursions and percussion.  Breasts: declines today  Abdomen: Soft, nontender, no guarding, rebound, hernias, masses, or organomegaly.  Lymphatics: Non tender without lymphadenopathy.  Genitourinary: defer Musculoskeletal: Full ROM all peripheral extremities,5/5 strength, and normal gait, negative straight leg.  Non tender through anterior chest wall Skin: Warm, dry without rashes, lesions, ecchymosis. Neuro: Cranial nerves intact, reflexes equal bilaterally. Normal muscle tone, no cerebellar symptoms. Sensation intact.  Psych: Awake and oriented X 3, normal affect, Insight and Judgment appropriate.   EKG: Sinus bradycardia, WNL, no ST changes   Deirdra Heumann 5:28 PM [facility]

## 2020-04-24 ENCOUNTER — Encounter: Payer: Self-pay | Admitting: Adult Health

## 2020-04-24 DIAGNOSIS — D72820 Lymphocytosis (symptomatic): Secondary | ICD-10-CM | POA: Insufficient documentation

## 2020-04-27 LAB — CBC WITH DIFFERENTIAL/PLATELET
Absolute Monocytes: 693 cells/uL (ref 200–950)
Basophils Absolute: 36 cells/uL (ref 0–200)
Basophils Relative: 0.4 %
Eosinophils Absolute: 90 cells/uL (ref 15–500)
Eosinophils Relative: 1 %
HCT: 39 % (ref 35.0–45.0)
Hemoglobin: 13 g/dL (ref 11.7–15.5)
Lymphs Abs: 4563 cells/uL — ABNORMAL HIGH (ref 850–3900)
MCH: 28.5 pg (ref 27.0–33.0)
MCHC: 33.3 g/dL (ref 32.0–36.0)
MCV: 85.5 fL (ref 80.0–100.0)
MPV: 11.2 fL (ref 7.5–12.5)
Monocytes Relative: 7.7 %
Neutro Abs: 3618 cells/uL (ref 1500–7800)
Neutrophils Relative %: 40.2 %
Platelets: 309 10*3/uL (ref 140–400)
RBC: 4.56 10*6/uL (ref 3.80–5.10)
RDW: 13.3 % (ref 11.0–15.0)
Total Lymphocyte: 50.7 %
WBC: 9 10*3/uL (ref 3.8–10.8)

## 2020-04-27 LAB — LIPID PANEL
Cholesterol: 145 mg/dL (ref <200)
HDL: 39 mg/dL — ABNORMAL LOW (ref >=50)
LDL Cholesterol (Calc): 84 mg/dL (calc)
Non-HDL Cholesterol (Calc): 106 mg/dL (calc) (ref <130)
Total CHOL/HDL Ratio: 3.7 (calc) (ref <5.0)
Triglycerides: 121 mg/dL (ref <150)

## 2020-04-27 LAB — PATHOLOGIST SMEAR REVIEW

## 2020-04-27 LAB — HEMOGLOBIN A1C
Hgb A1c MFr Bld: 6.5 % of total Hgb — ABNORMAL HIGH (ref <5.7)
Mean Plasma Glucose: 140 mg/dL
eAG (mmol/L): 7.7 mmol/L

## 2020-04-27 LAB — COMPLETE METABOLIC PANEL WITH GFR
AG Ratio: 1.7 (calc) (ref 1.0–2.5)
ALT: 31 U/L — ABNORMAL HIGH (ref 6–29)
AST: 20 U/L (ref 10–35)
Albumin: 4.7 g/dL (ref 3.6–5.1)
Alkaline phosphatase (APISO): 55 U/L (ref 37–153)
BUN/Creatinine Ratio: 11 (calc) (ref 6–22)
BUN: 13 mg/dL (ref 7–25)
CO2: 32 mmol/L (ref 20–32)
Calcium: 10.4 mg/dL (ref 8.6–10.4)
Chloride: 101 mmol/L (ref 98–110)
Creat: 1.16 mg/dL — ABNORMAL HIGH (ref 0.50–0.99)
GFR, Est African American: 59 mL/min/{1.73_m2} — ABNORMAL LOW (ref >=60)
GFR, Est Non African American: 51 mL/min/{1.73_m2} — ABNORMAL LOW (ref >=60)
Globulin: 2.8 g/dL (calc) (ref 1.9–3.7)
Glucose, Bld: 81 mg/dL (ref 65–99)
Potassium: 4.2 mmol/L (ref 3.5–5.3)
Sodium: 139 mmol/L (ref 135–146)
Total Bilirubin: 0.5 mg/dL (ref 0.2–1.2)
Total Protein: 7.5 g/dL (ref 6.1–8.1)

## 2020-04-27 LAB — URINALYSIS, ROUTINE W REFLEX MICROSCOPIC
Bilirubin Urine: NEGATIVE
Glucose, UA: NEGATIVE
Hgb urine dipstick: NEGATIVE
Ketones, ur: NEGATIVE
Leukocytes,Ua: NEGATIVE
Nitrite: NEGATIVE
Protein, ur: NEGATIVE
Specific Gravity, Urine: 1.006 (ref 1.001–1.03)
pH: 6 (ref 5.0–8.0)

## 2020-04-27 LAB — TEST AUTHORIZATION

## 2020-04-27 LAB — MICROALBUMIN / CREATININE URINE RATIO
Creatinine, Urine: 55 mg/dL (ref 20–275)
Microalb Creat Ratio: 5 mcg/mg creat (ref <30)
Microalb, Ur: 0.3 mg/dL

## 2020-04-27 LAB — VITAMIN B12: Vitamin B-12: >2000 pg/mL — ABNORMAL HIGH (ref 200–1100)

## 2020-04-27 LAB — VITAMIN D 25 HYDROXY (VIT D DEFICIENCY, FRACTURES): Vit D, 25-Hydroxy: 79 ng/mL (ref 30–100)

## 2020-04-27 LAB — MAGNESIUM: Magnesium: 2.1 mg/dL (ref 1.5–2.5)

## 2020-04-27 LAB — TSH: TSH: 0.69 mIU/L (ref 0.40–4.50)

## 2020-04-29 ENCOUNTER — Encounter: Payer: Self-pay | Admitting: Adult Health

## 2020-04-29 DIAGNOSIS — D7289 Other specified disorders of white blood cells: Secondary | ICD-10-CM | POA: Insufficient documentation

## 2020-05-05 ENCOUNTER — Ambulatory Visit: Payer: 59 | Admitting: Internal Medicine

## 2020-05-05 ENCOUNTER — Other Ambulatory Visit: Payer: Self-pay

## 2020-05-05 ENCOUNTER — Encounter: Payer: Self-pay | Admitting: Internal Medicine

## 2020-05-05 VITALS — BP 128/67 | HR 68 | Ht 64.0 in | Wt 181.2 lb

## 2020-05-05 DIAGNOSIS — R072 Precordial pain: Secondary | ICD-10-CM

## 2020-05-05 DIAGNOSIS — R002 Palpitations: Secondary | ICD-10-CM | POA: Diagnosis not present

## 2020-05-05 DIAGNOSIS — E785 Hyperlipidemia, unspecified: Secondary | ICD-10-CM | POA: Diagnosis not present

## 2020-05-05 NOTE — Progress Notes (Signed)
Cardiology Office Note:    Date:  05/05/2020   ID:  Vicki Wright, DOB 1960/10/24, MRN [id number]  PCP:  Unk Pinto, MD  Cardiologist:  No primary care provider on file.  Electrophysiologist:  None   Referring MD: Liane Comber, NP   Chief Complaint/Reason for Referral: CP, SOB, family history of heart disease  History of Present Illness:    Vicki Wright is a 60 y.o. female with a history of HLD, prediabetic.   SOB when carrying things. Chest heaviness with exertion. Several months ago started. Goes away after seconds-minutes. Walking out to the car carrying something.   +palpitations, racing heart  - at least weekly. Minutes. Deep breaths improve. No caffeine. TSH normal. Snoring +.   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   FHx: Mom - MI in 36s, HLD, HTN Dad - HTN, HLD, DM  Past Medical History:  Diagnosis Date  . Abnormal MRI   . Colon polyp   . Hot flashes   . Hypercholesteremia   . Hypothyroid   . Memory loss   . Osteoarthritis     Past Surgical History:  Procedure Laterality Date  . CESAREAN SECTION  82 & 84  . VAGINAL HYSTERECTOMY  2002   TAH    Current Medications: Current Meds  Medication Sig  . atorvastatin (LIPITOR) 40 MG tablet Take     1 tablet     Daily     for Cholesterol  . Cholecalciferol (VITAMIN D3) 5000 UNITS TABS Take 5,000 Units by mouth daily.  Marland Kitchen levothyroxine (SYNTHROID) 150 MCG tablet TAKE 1 TABLET DAILY ON AN EMPTY STOMACH WITH ONLY WATER FOR 96 MINUTES & NO ANTACID,CALCIUM, MAGNESIUM, AVOID BIOTIN  . Multiple Vitamins-Minerals (HAIR/SKIN/NAILS PO) Take by mouth daily.  . vitamin B-12 (CYANOCOBALAMIN) 100 MCG tablet Take 100 mcg by mouth daily.     Allergies:   Cephalexin   Social History   Tobacco Use  . Smoking status: Never Smoker  . Smokeless tobacco: Never Used  Vaping Use  . Vaping Use: Never used  Substance Use Topics  . Alcohol use: No  . Drug use: No      Family History: The patient's family history includes Breast cancer in her maternal aunt, maternal aunt, and paternal aunt; Cancer in her mother, paternal aunt, and paternal aunt; Dementia in her father; Diabetes in her father and mother; Heart disease in her father and mother; Hypertension in her father and mother; Uterine cancer in her paternal aunt.  ROS:   Please see the history of present illness.    All other systems reviewed and are negative.  EKGs/Labs/Other Studies Reviewed:    The following studies were reviewed today:  EKG:  NSR, nonspecific T wave abnl   Recent Labs: 04/23/2020: ALT 31; BUN 13; Creat 1.16; Hemoglobin 13.0; Magnesium 2.1; Platelets 309; Potassium 4.2; Sodium 139; TSH 0.69  Recent Lipid Panel    Component Value Date/Time   CHOL 145 04/23/2020 1618   TRIG 121 04/23/2020 1618   HDL 39 (L) 04/23/2020 1618   CHOLHDL 3.7 04/23/2020 1618   VLDL 19 06/27/2016 1549   LDLCALC 84 04/23/2020 1618    Physical Exam:    VS:  BP 128/67 (BP Location: Left Arm, Patient Position: Sitting)   Pulse 68   Ht $R'5\' 4"'Qh$  (1.626 m)   Wt 181 lb 3.2 oz (82.2 kg)   SpO2 97%   BMI 31.10 kg/m  Wt Readings from Last 5 Encounters:  05/05/20 181 lb 3.2 oz (82.2 kg)  04/23/20 184 lb 6.4 oz (83.6 kg)  01/09/20 185 lb (83.9 kg)  11/28/19 164 lb (74.4 kg)  10/30/19 161 lb (73 kg)    Constitutional: No acute distress Eyes: sclera non-icteric, normal conjunctiva and lids ENMT: normal dentition, moist mucous membranes Cardiovascular: regular rhythm, normal rate, no murmurs. S1 and S2 normal. Radial pulses normal bilaterally. No jugular venous distention.  Respiratory: clear to auscultation bilaterally GI : normal bowel sounds, soft and nontender. No distention.   MSK: extremities warm, well perfused. No edema.  NEURO: grossly nonfocal exam, moves all extremities. PSYCH: alert and oriented x 3, normal mood and affect.   ASSESSMENT:    1. Precordial pain   2. Essential  hypertension   3. Hyperlipidemia, unspecified hyperlipidemia type   4. Palpitations    PLAN:    Precordial pain - Plan: EKG 12-Lead, Basic metabolic panel, CT CORONARY MORPH  - will obtain CCTA for chest pain given risk factor of HLD, and family history and prediabetes  Hyperlipidemia, unspecified hyperlipidemia type - Lipids overall controlled on atorvastatin 40 mg daily.   Palpitations   2 week monitor, echo, and sleep study defered by patient and can discuss at follow up.   Weston Brass, MD [place]  [facility]    Medication Adjustments/Labs and Tests Ordered: Current medicines are reviewed at length with the patient today.  Concerns regarding medicines are outlined above.   Orders Placed This Encounter  Procedures  . CT CORONARY MORPH W/CTA COR W/SCORE W/CA W/CM &/OR WO/CM  . CT CORONARY FRACTIONAL FLOW RESERVE DATA PREP  . CT CORONARY FRACTIONAL FLOW RESERVE FLUID ANALYSIS  . Basic metabolic panel  . EKG 12-Lead    No orders of the defined types were placed in this encounter.   Patient Instructions  Medication Instructions:  No Changes In Medications at this time.  *If you need a refill on your cardiac medications before your next appointment, please call your pharmacy*  Lab Work: BMET- PLEASE HAVE THIS DRAWN 7 DAYS PRIOR TO CTA  If you have labs (blood work) drawn today and your tests are completely normal, you will receive your results only by: Marland Kitchen MyChart Message (if you have MyChart) OR . A paper copy in the mail If you have any lab test that is abnormal or we need to change your treatment, we will call you to review the results.  Testing/Procedures: Your physician has requested that you have cardiac CT. Cardiac computed tomography (CT) is a painless test that uses an x-ray machine to take clear, detailed pictures of your heart. For further information please visit [email or web address] Please follow instruction sheet as given.  Follow-Up: At [facility], you and your health needs are our priority.  As part of our continuing mission to provide you with exceptional heart care, we have created designated Provider Care Teams.  These Care Teams include your primary Cardiologist (physician) and Advanced Practice Providers (APPs -  Physician Assistants and Nurse Practitioners) who all work together to provide you with the care you need, when you need it.  Your next appointment:   6 week(s)  The format for your next appointment:   In Person  Provider:   Weston Brass, MD    Other Instructions Your cardiac CT will be scheduled at one of the below locations:   [facility] [address] [place], [place] [address] [phone]  If scheduled at  [facility], please arrive at the [facility] main entrance of [facility] 30 minutes prior to test start time. Proceed to the [facility] Radiology Department (first floor) to check-in and test prep.  Please follow these instructions carefully (unless otherwise directed):  On the Night Before the Test: . Be sure to Drink plenty of water. . Do not consume any caffeinated/decaffeinated beverages or chocolate 12 hours prior to your test. . Do not take any antihistamines 12 hours prior to your test. .  On the Day of the Test: . Drink plenty of water. Do not drink any water within one hour of the test. . Do not eat any food 4 hours prior to the test. . You may take your regular medications prior to the test.  . HOLD Furosemide/Hydrochlorothiazide morning of the test. . FEMALES- please wear underwire-free bra if available    After the Test: . Drink plenty of water. . After receiving IV contrast, you may experience a mild flushed feeling. This is normal. . On occasion, you may experience a mild rash up to 24 hours after the test. This is not dangerous. If this occurs, you can take Benadryl 25 mg and increase your fluid intake. . If you experience trouble  breathing, this can be serious. If it is severe call 911 IMMEDIATELY. If it is mild, please call our office. . If you take any of these medications: Glipizide/Metformin, Avandament, Glucavance, please do not take 48 hours after completing test unless otherwise instructed.  Once we have confirmed authorization from your insurance company, we will call you to set up a date and time for your test. Based on how quickly your insurance processes prior authorizations requests, please allow up to 4 weeks to be contacted for scheduling your Cardiac CT appointment. Be advised that routine Cardiac CT appointments could be scheduled as many as 8 weeks after your provider has ordered it.  For non-scheduling related questions, please contact the cardiac imaging nurse navigator should you have any questions/concerns: Marchia Bond, Cardiac Imaging Nurse Navigator Burley Saver, Interim Cardiac Imaging Nurse [place] and V[facility] Services Direct Office Dial: [phone]   For scheduling needs, including cancellations and rescheduling, please call [place], [phone].  PLEASE LET us KNOW IF YOU WOULD LIKE TO SCHEDULE FURTHER TESTING.

## 2020-05-05 NOTE — Patient Instructions (Addendum)
Medication Instructions:  No Changes In Medications at this time.  *If you need a refill on your cardiac medications before your next appointment, please call your pharmacy*  Lab Work: BMET- PLEASE HAVE THIS DRAWN 7 DAYS PRIOR TO CTA  If you have labs (blood work) drawn today and your tests are completely normal, you will receive your results only by: Marland Kitchen MyChart Message (if you have MyChart) OR . A paper copy in the mail If you have any lab test that is abnormal or we need to change your treatment, we will call you to review the results.  Testing/Procedures: Your physician has requested that you have cardiac CT. Cardiac computed tomography (CT) is a painless test that uses an x-ray machine to take clear, detailed pictures of your heart. For further information please visit [email or web address]. Please follow instruction sheet as given.  Follow-Up: At [facility], you and your health needs are our priority.  As part of our continuing mission to provide you with exceptional heart care, we have created designated Provider Care Teams.  These Care Teams include your primary Cardiologist (physician) and Advanced Practice Providers (APPs -  Physician Assistants and Nurse Practitioners) who all work together to provide you with the care you need, when you need it.  Your next appointment:   6 week(s)  The format for your next appointment:   In Person  Provider:   Cherlynn Kaiser, MD    Other Instructions Your cardiac CT will be scheduled at one of the below locations:   [facility] [address] [place], [place] [address] [phone]  If scheduled at [facility], please arrive at the [facility] main entrance of [facility] 30 minutes prior to test start time. Proceed to the [facility] Radiology Department (first floor) to check-in and test prep.  Please follow these instructions carefully (unless otherwise directed):  On the Night Before the  Test: . Be sure to Drink plenty of water. . Do not consume any caffeinated/decaffeinated beverages or chocolate 12 hours prior to your test. . Do not take any antihistamines 12 hours prior to your test. .  On the Day of the Test: . Drink plenty of water. Do not drink any water within one hour of the test. . Do not eat any food 4 hours prior to the test. . You may take your regular medications prior to the test.  . HOLD Furosemide/Hydrochlorothiazide morning of the test. . FEMALES- please wear underwire-free bra if available    After the Test: . Drink plenty of water. . After receiving IV contrast, you may experience a mild flushed feeling. This is normal. . On occasion, you may experience a mild rash up to 24 hours after the test. This is not dangerous. If this occurs, you can take Benadryl 25 mg and increase your fluid intake. . If you experience trouble breathing, this can be serious. If it is severe call 911 IMMEDIATELY. If it is mild, please call our office. . If you take any of these medications: Glipizide/Metformin, Avandament, Glucavance, please do not take 48 hours after completing test unless otherwise instructed.  Once we have confirmed authorization from your insurance company, we will call you to set up a date and time for your test. Based on how quickly your insurance processes prior authorizations requests, please allow up to 4 weeks to be contacted for scheduling your Cardiac CT appointment. Be advised that routine Cardiac CT appointments could be scheduled as many as 8  weeks after your provider has ordered it.  For non-scheduling related questions, please contact the cardiac imaging nurse navigator should you have any questions/concerns: Marchia Bond, Cardiac Imaging Nurse Navigator Burley Saver, Interim Cardiac Imaging Nurse [place] and Vascular Services Direct Office Dial: [phone]   For scheduling needs, including cancellations and rescheduling,  please call [place], [phone].  PLEASE LET us KNOW IF YOU WOULD LIKE TO SCHEDULE FURTHER TESTING.

## 2020-05-27 LAB — BASIC METABOLIC PANEL
BUN/Creatinine Ratio: 10 — ABNORMAL LOW (ref 12–28)
BUN: 11 mg/dL (ref 8–27)
CO2: 23 mmol/L (ref 20–29)
Calcium: 9.4 mg/dL (ref 8.7–10.3)
Chloride: 103 mmol/L (ref 96–106)
Creatinine, Ser: 1.07 mg/dL — ABNORMAL HIGH (ref 0.57–1.00)
GFR calc Af Amer: 65 mL/min/{1.73_m2} (ref >59)
GFR calc non Af Amer: 56 mL/min/{1.73_m2} — ABNORMAL LOW (ref >59)
Glucose: 134 mg/dL — ABNORMAL HIGH (ref 65–99)
Potassium: 4.5 mmol/L (ref 3.5–5.2)
Sodium: 141 mmol/L (ref 134–144)

## 2020-06-01 ENCOUNTER — Telehealth (HOSPITAL_COMMUNITY): Payer: Self-pay | Admitting: *Deleted

## 2020-06-01 NOTE — Telephone Encounter (Signed)
Reaching out to patient to offer assistance regarding upcoming cardiac imaging study; pt verbalizes understanding of appt date/time, parking situation and where to check in, pre-test NPO status, and verified current allergies; name and call back number provided for further questions should they arise  Lejon Afzal RN Navigator Cardiac Imaging [place] [phone] office [phone] cell  

## 2020-06-02 ENCOUNTER — Encounter (HOSPITAL_COMMUNITY): Payer: Self-pay

## 2020-06-02 ENCOUNTER — Ambulatory Visit (HOSPITAL_COMMUNITY)
Admission: RE | Admit: 2020-06-02 | Discharge: 2020-06-02 | Disposition: A | Discharge status: Home / Self Care | Payer: 59 | Source: Ambulatory Visit | Attending: Internal Medicine | Admitting: Internal Medicine

## 2020-06-02 ENCOUNTER — Other Ambulatory Visit: Payer: Self-pay

## 2020-06-02 DIAGNOSIS — R072 Precordial pain: Secondary | ICD-10-CM | POA: Insufficient documentation

## 2020-06-02 DIAGNOSIS — Z006 Encounter for examination for normal comparison and control in clinical research program: Secondary | ICD-10-CM

## 2020-06-02 MED ORDER — METOPROLOL TARTRATE 5 MG/5ML IV SOLN
INTRAVENOUS | Status: AC
  Administered 2020-06-02: 5 mg
  Filled 2020-06-02: qty 5

## 2020-06-02 MED ORDER — NITROGLYCERIN 0.4 MG SL SUBL
SUBLINGUAL_TABLET | SUBLINGUAL | Status: AC
  Administered 2020-06-02: 0.8 mg via SUBLINGUAL
  Filled 2020-06-02: qty 2

## 2020-06-02 MED ORDER — NITROGLYCERIN 0.4 MG SL SUBL: 0.8000 mg | SUBLINGUAL_TABLET | Freq: Once | SUBLINGUAL | Status: AC

## 2020-06-02 MED ORDER — IOHEXOL 350 MG/ML SOLN
80.0000 mL | Freq: Once | INTRAVENOUS | Status: AC | PRN
  Administered 2020-06-02: 80 mL via INTRAVENOUS

## 2020-06-02 NOTE — Research (Signed)
IDENTIFY Informed Consent                  Subject Name: Vicki Wright    Subject met inclusion and exclusion criteria.  The informed consent form, study requirements and expectations were reviewed with the subject and questions and concerns were addressed prior to the signing of the consent form.  The subject verbalized understanding of the trial requirements.  The subject agreed to participate in the IDENTIFY trial and signed the informed consent at 09:03AM on 06/02/20.  The informed consent was obtained prior to performance of any protocol-specific procedures for the subject.  A copy of the signed informed consent was given to the subject and a copy was placed in the subject's medical record.   Meade Maw, Naval architect

## 2020-06-07 ENCOUNTER — Other Ambulatory Visit: Payer: Self-pay | Admitting: Adult Health

## 2020-06-07 DIAGNOSIS — E039 Hypothyroidism, unspecified: Secondary | ICD-10-CM

## 2020-06-19 ENCOUNTER — Telehealth: Payer: Self-pay | Admitting: Internal Medicine

## 2020-06-19 NOTE — Telephone Encounter (Signed)
Patient called back to receive her results

## 2020-06-19 NOTE — Telephone Encounter (Signed)
Spoke with patient regarding recent lab results. (BMET)  Parke Poisson, MD  06/09/2020 4:20 PM EST      Stable labs pre CT.   Patient made aware and verbalized understanding. Advised patient to call back with any issues, questions, or concerns.

## 2020-07-03 ENCOUNTER — Other Ambulatory Visit: Payer: Self-pay | Admitting: Internal Medicine

## 2020-07-03 DIAGNOSIS — E785 Hyperlipidemia, unspecified: Secondary | ICD-10-CM

## 2020-07-14 ENCOUNTER — Ambulatory Visit: Payer: 59 | Admitting: Internal Medicine

## 2020-07-14 ENCOUNTER — Encounter: Payer: Self-pay | Admitting: Internal Medicine

## 2020-07-14 ENCOUNTER — Other Ambulatory Visit: Payer: Self-pay

## 2020-07-14 VITALS — BP 146/78 | HR 74 | Ht 64.0 in | Wt 182.2 lb

## 2020-07-14 DIAGNOSIS — R030 Elevated blood-pressure reading, without diagnosis of hypertension: Secondary | ICD-10-CM | POA: Diagnosis not present

## 2020-07-14 DIAGNOSIS — E785 Hyperlipidemia, unspecified: Secondary | ICD-10-CM | POA: Diagnosis not present

## 2020-07-14 DIAGNOSIS — R072 Precordial pain: Secondary | ICD-10-CM | POA: Diagnosis not present

## 2020-07-14 DIAGNOSIS — R002 Palpitations: Secondary | ICD-10-CM | POA: Diagnosis not present

## 2020-07-14 NOTE — Progress Notes (Signed)
Cardiology Office Note:    Date:  07/14/2020   ID:  Vicki Wright, DOB May 20, 1960, MRN [id number]  PCP:  Lucky Cowboy, MD  Cardiologist:  Parke Poisson, MD  Electrophysiologist:  None   Referring MD: Lucky Cowboy, MD   Chief Complaint/Reason for Referral: Follow-up chest pain  History of Present Illness:    Vicki Wright is a 60 y.o. female with a history of hyperlipidemia and prediabetes presents for follow-up after coronary CTA.  Coronary CTA showed minimal CAD and coronary calcium score of 8.  We discussed that this is overall low risk but indicates coronary artery disease requiring secondary prevention.  She is currently taking atorvastatin 4 times a week with an LDL of 84.  We discussed intensifying statin therapy, she will think about this, and will consider increasing the dosing schedule to daily dosing for improvement in lipids.  For now she is comfortable with her lipids as is.  Changed diet since last visit. Chest pain and SOB not there after retiring. 99.9% better sypmtoms.  She would like to defer the monitor and echocardiogram and any other testing at this time.  Hemoglobin A1c is 6.5 when checked in January suggesting diabetes.  She thinks with the changes in her diet things have only improved.  Past Medical History:  Diagnosis Date  . Abnormal MRI   . Colon polyp   . Hot flashes   . Hypercholesteremia   . Hypothyroid   . Memory loss   . Osteoarthritis     Past Surgical History:  Procedure Laterality Date  . CESAREAN SECTION  82 & 84  . VAGINAL HYSTERECTOMY  2002   TAH    Current Medications: Current Meds  Medication Sig  . atorvastatin (LIPITOR) 40 MG tablet TAKE 1 TABLET BY MOUTH EVERY DAY FOR CHOLESTEROL  . Cholecalciferol (VITAMIN D3) 5000 UNITS TABS Take 5,000 Units by mouth daily.  Marland Kitchen levothyroxine (SYNTHROID) 150 MCG tablet Take  1 tablet  Daily  on an empty stomach with only water for 30 minutes & no Antacid meds, Calcium or Magnesium  for 4 hours & avoid Biotin  . Multiple Vitamins-Minerals (HAIR/SKIN/NAILS PO) Take by mouth daily.  . vitamin B-12 (CYANOCOBALAMIN) 100 MCG tablet Take 100 mcg by mouth daily.     Allergies:   Cephalexin   Social History   Tobacco Use  . Smoking status: Never Smoker  . Smokeless tobacco: Never Used  Vaping Use  . Vaping Use: Never used  Substance Use Topics  . Alcohol use: No  . Drug use: No     Family History: The patient's family history includes Breast cancer in her maternal aunt, maternal aunt, and paternal aunt; Cancer in her mother, paternal aunt, and paternal aunt; Dementia in her father; Diabetes in her father and mother; Heart disease in her father and mother; Hypertension in her father and mother; Uterine cancer in her paternal aunt.  ROS:   Please see the history of present illness.    All other systems reviewed and are negative.  EKGs/Labs/Other Studies Reviewed:    The following studies were reviewed today:  Recent Labs: 04/23/2020: ALT 31; Hemoglobin 13.0; Magnesium 2.1; Platelets 309; TSH 0.69 05/26/2020: BUN 11; Creatinine, Ser 1.07; Potassium 4.5; Sodium 141  Recent Lipid Panel    Component Value Date/Time   CHOL 145 04/23/2020 1618   TRIG 121 04/23/2020 1618   HDL 39 (L) 04/23/2020 1618   CHOLHDL 3.7 04/23/2020 1618   VLDL 19 06/27/2016 1549  LDLCALC 84 04/23/2020 1618    Physical Exam:    VS:  BP (!) 146/78   Pulse 74   Ht 5\' 4"  (1.626 m)   Wt 182 lb 3.2 oz (82.6 kg)   SpO2 96%   BMI 31.27 kg/m     Wt Readings from Last 5 Encounters:  07/14/20 182 lb 3.2 oz (82.6 kg)  05/05/20 181 lb 3.2 oz (82.2 kg)  04/23/20 184 lb 6.4 oz (83.6 kg)  01/09/20 185 lb (83.9 kg)  11/28/19 164 lb (74.4 kg)    Constitutional: No acute distress Eyes: sclera non-icteric, normal conjunctiva and lids ENMT: normal dentition, moist mucous membranes Cardiovascular: regular rhythm, normal rate, no murmurs. S1 and S2 normal. Radial pulses normal bilaterally. No  jugular venous distention.  Respiratory: clear to auscultation bilaterally GI : normal bowel sounds, soft and nontender. No distention.   MSK: extremities warm, well perfused. No edema.  NEURO: grossly nonfocal exam, moves all extremities. PSYCH: alert and oriented x 3, normal mood and affect.   ASSESSMENT:    1. Precordial pain   2. Elevated BP without diagnosis of hypertension   3. Hyperlipidemia, unspecified hyperlipidemia type   4. Palpitations    PLAN:    Precordial pain -Chest pain has essentially resolved, coronary CTA low risk with minimal CAD.  Continue atorvastatin 40 mg 4 times a week.  Would consider increasing dosing to 7 days a week to bring her LDL less than 70.  Elevated BP without diagnosis of hypertension -Situationally elevated blood pressure, will serially evaluate and if elevated at next check, will consider adding antihypertensive therapy.  Hyperlipidemia, unspecified hyperlipidemia type -As above, goal LDL is less than 70, consider increasing the dosing schedule of atorvastatin.  Palpitations-these have resolved, observe.   Total time of encounter: 30 minutes total time of encounter, including 20 minutes spent in face-to-face patient care on the date of this encounter. This time includes coordination of care and counseling regarding above mentioned problem list. Remainder of non-face-to-face time involved reviewing chart documents/testing relevant to the patient encounter and documentation in the medical record. I have independently reviewed documentation from referring provider.   11/30/19, MD, [facility]  [facility]    Medication Adjustments/Labs and Tests Ordered: Current medicines are reviewed at length with the patient today.  Concerns regarding medicines are outlined above.   No orders of the defined types were placed in this encounter.    No orders of the defined types were placed in this encounter.   Patient Instructions   Medication Instructions:  No Changes In Medications at this time.  *If you need a refill on your cardiac medications before your next appointment, please call your pharmacy*  Follow-Up: At [facility], you and your health needs are our priority.  As part of our continuing mission to provide you with exceptional heart care, we have created designated Provider Care Teams.  These Care Teams include your primary Cardiologist (physician) and Advanced Practice Providers (APPs -  Physician Assistants and Nurse Practitioners) who all work together to provide you with the care you need, when you need it.  Your next appointment:   6 month(s)  The format for your next appointment:   In Person  Provider:   [facility], MD

## 2020-07-14 NOTE — Patient Instructions (Signed)

## 2020-07-30 ENCOUNTER — Ambulatory Visit: Payer: 59 | Admitting: Adult Health

## 2020-07-30 ENCOUNTER — Other Ambulatory Visit: Payer: Self-pay

## 2020-07-30 ENCOUNTER — Encounter: Payer: Self-pay | Admitting: Adult Health

## 2020-07-30 VITALS — BP 118/68 | HR 80 | Temp 97.3°F | Ht 64.0 in | Wt 182.0 lb

## 2020-07-30 DIAGNOSIS — E039 Hypothyroidism, unspecified: Secondary | ICD-10-CM | POA: Diagnosis not present

## 2020-07-30 DIAGNOSIS — E785 Hyperlipidemia, unspecified: Secondary | ICD-10-CM

## 2020-07-30 DIAGNOSIS — D7289 Other specified disorders of white blood cells: Secondary | ICD-10-CM

## 2020-07-30 DIAGNOSIS — E669 Obesity, unspecified: Secondary | ICD-10-CM

## 2020-07-30 DIAGNOSIS — R7309 Other abnormal glucose: Secondary | ICD-10-CM

## 2020-07-30 DIAGNOSIS — I10 Essential (primary) hypertension: Secondary | ICD-10-CM

## 2020-07-30 DIAGNOSIS — D72820 Lymphocytosis (symptomatic): Secondary | ICD-10-CM

## 2020-07-30 NOTE — Patient Instructions (Addendum)
Goals    . HEMOGLOBIN A1C < 5.7    . Weight (lb) < 170 lb (77.1 kg)       Aim for 1/2-1 cup of beans each day  Whole grains - old fashioned oats, quinoa, brown rice, farro, etc -   Nuts/seeds - 1-2 ounces daily - chia seeds and ground flax seeds are particularly good for fiber  Most non-starchy fruits and veggies are good   High-Fiber Eating Plan Fiber, also called dietary fiber, is a type of carbohydrate. It is found foods such as fruits, vegetables, whole grains, and beans. A high-fiber diet can have many health benefits. Your health care provider may recommend a high-fiber diet to help:  Prevent constipation. Fiber can make your bowel movements more regular.  Lower your cholesterol.  Relieve the following conditions: ? Inflammation of veins in the anus (hemorrhoids). ? Inflammation of specific areas of the digestive tract (uncomplicated diverticulosis). ? A problem of the large intestine, also called the colon, that sometimes causes pain and diarrhea (irritable bowel syndrome, or IBS).  Prevent overeating as part of a weight-loss plan.  Prevent heart disease, type 2 diabetes, and certain cancers. What are tips for following this plan? Reading food labels  Check the nutrition facts label on food products for the amount of dietary fiber. Choose foods that have 5 grams of fiber or more per serving.  The goals for recommended daily fiber intake include: ? Men (age 65 or younger): 34-38 g. ? Men (over age 53): 28-34 g. ? Women (age 58 or younger): 25-28 g. ? Women (over age 75): 22-25 g. Your daily fiber goal is _____________ g.   Shopping  Choose whole fruits and vegetables instead of processed forms, such as apple juice or applesauce.  Choose a wide variety of high-fiber foods such as avocados, lentils, oats, and kidney beans.  Read the nutrition facts label of the foods you choose. Be aware of foods with added fiber. These foods often have high sugar and sodium amounts  per serving. Cooking  Use whole-grain flour for baking and cooking.  Cook with brown rice instead of white rice. Meal planning  Start the day with a breakfast that is high in fiber, such as a cereal that contains 5 g of fiber or more per serving.  Eat breads and cereals that are made with whole-grain flour instead of refined flour or white flour.  Eat brown rice, bulgur wheat, or millet instead of white rice.  Use beans in place of meat in soups, salads, and pasta dishes.  Be sure that half of the grains you eat each day are whole grains. General information  You can get the recommended daily intake of dietary fiber by: ? Eating a variety of fruits, vegetables, grains, nuts, and beans. ? Taking a fiber supplement if you are not able to take in enough fiber in your diet. It is better to get fiber through food than from a supplement.  Gradually increase how much fiber you consume. If you increase your intake of dietary fiber too quickly, you may have bloating, cramping, or gas.  Drink plenty of water to help you digest fiber.  Choose high-fiber snacks, such as berries, raw vegetables, nuts, and popcorn. What foods should I eat? Fruits Berries. Pears. Apples. Oranges. Avocado. Prunes and raisins. Dried figs. Vegetables Sweet potatoes. Spinach. Kale. Artichokes. Cabbage. Broccoli. Cauliflower. Green peas. Carrots. Squash. Grains Whole-grain breads. Multigrain cereal. Oats and oatmeal. Brown rice. Barley. Bulgur wheat. Millet. Quinoa. Bran muffins.  Popcorn. Rye wafer crackers. Meats and other proteins Navy beans, kidney beans, and pinto beans. Soybeans. Split peas. Lentils. Nuts and seeds. Dairy Fiber-fortified yogurt. Beverages Fiber-fortified soy milk. Fiber-fortified orange juice. Other foods Fiber bars. The items listed above may not be a complete list of recommended foods and beverages. Contact a dietitian for more information. What foods should I avoid? Fruits Fruit  juice. Cooked, strained fruit. Vegetables Fried potatoes. Canned vegetables. Well-cooked vegetables. Grains White bread. Pasta made with refined flour. White rice. Meats and other proteins Fatty cuts of meat. Fried chicken or fried fish. Dairy Milk. Yogurt. Cream cheese. Sour cream. Fats and oils Butters. Beverages Soft drinks. Other foods Cakes and pastries. The items listed above may not be a complete list of foods and beverages to avoid. Talk with your dietitian about what choices are best for you. Summary  Fiber is a type of carbohydrate. It is found in foods such as fruits, vegetables, whole grains, and beans.  A high-fiber diet has many benefits. It can help to prevent constipation, lower blood cholesterol, aid weight loss, and reduce your risk of heart disease, diabetes, and certain cancers.  Increase your intake of fiber gradually. Increasing fiber too quickly may cause cramping, bloating, and gas. Drink plenty of water while you increase the amount of fiber you consume.  The best sources of fiber include whole fruits and vegetables, whole grains, nuts, seeds, and beans. This information is not intended to replace advice given to you by your health care provider. Make sure you discuss any questions you have with your health care provider. Document Revised: 08/01/2019 Document Reviewed: 08/01/2019 Elsevier Patient Education  2021 [employer].

## 2020-07-30 NOTE — Progress Notes (Signed)
3 MONTH FOLLOW UP  Assessment and Plan:  Hypothyroidism, unspecified hypothyroidism type Hypothyroidism-check TSH level, continue medications the same, reminded to take on an empty stomach 30-59mins before food.  - TSH  Hyperlipidemia -continue medications, check lipids, decrease fatty foods, increase activity.  - Lipid panel  Prediabetes Continue weight loss Discussed general issues about diabetes pathophysiology and management.  Agricultural engineer distributed.  Encouraged aerobic exercise. - Hemoglobin A1c   Medication management - Magnesium  Vitamin D deficiency At goal at recent check; continue to recommend supplementation for goal of 60-100 Defer vitamin D level  Lymphocytosis/ atypical lymphs on path smear Check flow cytometry  Discussed med's effects and SE's. Screening labs and tests as requested with regular follow-up as recommended. Over 40 minutes of exam, counseling, chart review, and complex, high level critical decision making was performed this visit.   Future Appointments  Date Time Provider Department Center  04/26/2021  3:00 PM [name], NP [facility] None     HPI  60 y.o. AA female  presents for a 3 month follow up for htn, hyperlipidemia, prediabetes, obesity, hypothyroid, vitamin D def.    Newly retired - loving it. Has trip to mountains planned this summer, beach this fall.  BMI is Body mass index is 31.24 kg/m., she has been working on diet and exercise, walking daily weather permitting. She has been eating more salads, doing grilled fish, beans.  Wt Readings from Last 3 Encounters:  07/30/20 182 lb (82.6 kg)  07/14/20 182 lb 3.2 oz (82.6 kg)  05/05/20 181 lb 3.2 oz (82.2 kg)   Her blood pressure has been controlled at home, today their BP is BP: 118/68 She does workout, was walking daily at work until got cold. Denies chest pain, dyspnea, dizziness.   Had low risk coronary CT calcium score of 8 on 06/02/2020  She is on cholesterol  medication, lipitor 40mg  and denies myalgias. Her cholesterol is at goal. The cholesterol last visit was:   Lab Results  Component Value Date   CHOL 145 04/23/2020   HDL 39 (L) 04/23/2020   LDLCALC 84 04/23/2020   TRIG 121 04/23/2020   CHOLHDL 3.7 04/23/2020   She has been working on diet and exercise for prediabetes, and denies paresthesia of the feet, polydipsia, polyuria and visual disturbances. Last A1C in the office was:  Lab Results  Component Value Date   HGBA1C 6.5 (H) 04/23/2020   Last GFR: Lab Results  Component Value Date   GFRAA 65 05/26/2020   Patient is on Vitamin D supplement, she is on 5000 units. Lab Results  Component Value Date   VD25OH 79 04/23/2020     She is on thyroid medication. Her medication was not changed last visit, she is on 1 pill Monday- Thursday, none on F,S,S. She is on a biotin supplement.  Lab Results  Component Value Date   TSH 0.69 04/23/2020      Current Medications:   Current Outpatient Medications (Endocrine & Metabolic):  .  levothyroxine (SYNTHROID) 150 MCG tablet, Take  1 tablet  Daily  on an empty stomach with only water for 30 minutes & no Antacid meds, Calcium or Magnesium for 4 hours & avoid Biotin  Current Outpatient Medications (Cardiovascular):  .  atorvastatin (LIPITOR) 40 MG tablet, TAKE 1 TABLET BY MOUTH EVERY DAY FOR CHOLESTEROL    Current Outpatient Medications (Hematological):  .  vitamin B-12 (CYANOCOBALAMIN) 100 MCG tablet, Take 100 mcg by mouth daily.  Current Outpatient Medications (Other):  .  Cholecalciferol (VITAMIN D3) 5000 UNITS TABS, Take 5,000 Units by mouth daily. .  Multiple Vitamins-Minerals (HAIR/SKIN/NAILS PO), Take by mouth daily.  Medical History:  Past Medical History:  Diagnosis Date  . Abnormal MRI   . Colon polyp   . Hot flashes   . Hypercholesteremia   . Hypothyroid   . Memory loss   . Osteoarthritis    Allergies Allergies  Allergen Reactions  . Cephalexin     Throat  swelling    SURGICAL HISTORY She  has a past surgical history that includes Cesarean section (80 & 82) and Vaginal hysterectomy (2002). FAMILY HISTORY Her family history includes Breast cancer in her maternal aunt, maternal aunt, and paternal aunt; Cancer in her mother, paternal aunt, and paternal aunt; Dementia in her father; Diabetes in her father and mother; Heart disease in her father and mother; Hypertension in her father and mother; Uterine cancer in her paternal aunt. SOCIAL HISTORY She  reports that she has never smoked. She has never used smokeless tobacco. She reports that she does not drink alcohol and does not use drugs.   Review of Systems: Review of Systems  Constitutional: Negative for diaphoresis, malaise/fatigue and weight loss.  HENT: Negative.  Negative for hearing loss and tinnitus.   Eyes: Negative.  Negative for blurred vision and double vision.  Respiratory: Negative for cough, hemoptysis, sputum production, shortness of breath and wheezing.   Cardiovascular: Negative for chest pain, palpitations, orthopnea, claudication, leg swelling and PND.  Gastrointestinal: Negative.  Negative for abdominal pain, blood in stool, constipation, diarrhea, heartburn, melena, nausea and vomiting.  Genitourinary: Negative.   Musculoskeletal: Negative.  Negative for joint pain and myalgias.  Skin: Negative.  Negative for rash.  Neurological: Negative.  Negative for dizziness, tingling, sensory change, weakness and headaches.  Endo/Heme/Allergies: Negative.  Negative for polydipsia.  Psychiatric/Behavioral: Negative.   All other systems reviewed and are negative.   Physical Exam: Estimated body mass index is 31.24 kg/m as calculated from the following:   Height as of this encounter: 5\' 4"  (1.626 m).   Weight as of this encounter: 182 lb (82.6 kg). BP 118/68   Pulse 80   Temp (!) 97.3 F (36.3 C)   Ht 5\' 4"  (1.626 m)   Wt 182 lb (82.6 kg)   SpO2 96%   BMI 31.24 kg/m   General Appearance: Well nourished, in no apparent distress.  Eyes: PERRLA, EOMs, conjunctiva no swelling or erythema Sinuses: No Frontal/maxillary tenderness  ENT/Mouth: Ext aud canals clear, normal light reflex with TMs without erythema, bulging. Good dentition. No erythema, swelling, or exudate on post pharynx. Tonsils not swollen or erythematous. Hearing normal.  Neck: Supple, thyroid normal. No bruits  Respiratory: Respiratory effort normal, BS equal bilaterally without rales, rhonchi, wheezing or stridor.  Cardio: RRR without murmurs, rubs or gallops. Brisk peripheral pulses without edema.  Chest: symmetric, with normal excursions and percussion.  Abdomen: Soft, nontender, no guarding, rebound, hernias, masses, or organomegaly.  Lymphatics: Non tender without lymphadenopathy.  Musculoskeletal: Full ROM all peripheral extremities,5/5 strength, and normal gait.  Skin: Warm, dry without rashes, lesions, ecchymosis. Neuro: Cranial nerves intact, reflexes equal bilaterally. Normal muscle tone, no cerebellar symptoms. Sensation intact.  Psych: Awake and oriented X 3, normal affect, Insight and Judgment appropriate.     Vicki Wright 2:56 PM [facility]

## 2020-08-03 LAB — COMPLETE METABOLIC PANEL WITH GFR
AG Ratio: 1.6 (calc) (ref 1.0–2.5)
ALT: 39 U/L — ABNORMAL HIGH (ref 6–29)
AST: 24 U/L (ref 10–35)
Albumin: 4.4 g/dL (ref 3.6–5.1)
Alkaline phosphatase (APISO): 49 U/L (ref 37–153)
BUN/Creatinine Ratio: 14 (calc) (ref 6–22)
BUN: 15 mg/dL (ref 7–25)
CO2: 31 mmol/L (ref 20–32)
Calcium: 9.6 mg/dL (ref 8.6–10.4)
Chloride: 104 mmol/L (ref 98–110)
Creat: 1.09 mg/dL — ABNORMAL HIGH (ref 0.50–0.99)
GFR, Est African American: 63 mL/min/{1.73_m2} (ref >=60)
GFR, Est Non African American: 55 mL/min/{1.73_m2} — ABNORMAL LOW (ref >=60)
Globulin: 2.7 g/dL (calc) (ref 1.9–3.7)
Glucose, Bld: 124 mg/dL — ABNORMAL HIGH (ref 65–99)
Potassium: 4.3 mmol/L (ref 3.5–5.3)
Sodium: 140 mmol/L (ref 135–146)
Total Bilirubin: 0.6 mg/dL (ref 0.2–1.2)
Total Protein: 7.1 g/dL (ref 6.1–8.1)

## 2020-08-03 LAB — CBC WITH DIFFERENTIAL/PLATELET
Absolute Monocytes: 310 cells/uL (ref 200–950)
Basophils Absolute: 40 cells/uL (ref 0–200)
Basophils Relative: 0.6 %
Eosinophils Absolute: 59 cells/uL (ref 15–500)
Eosinophils Relative: 0.9 %
HCT: 38 % (ref 35.0–45.0)
Hemoglobin: 12.6 g/dL (ref 11.7–15.5)
Lymphs Abs: 2878 cells/uL (ref 850–3900)
MCH: 28.6 pg (ref 27.0–33.0)
MCHC: 33.2 g/dL (ref 32.0–36.0)
MCV: 86.2 fL (ref 80.0–100.0)
MPV: 11.1 fL (ref 7.5–12.5)
Monocytes Relative: 4.7 %
Neutro Abs: 3313 cells/uL (ref 1500–7800)
Neutrophils Relative %: 50.2 %
Platelets: 291 10*3/uL (ref 140–400)
RBC: 4.41 10*6/uL (ref 3.80–5.10)
RDW: 13.7 % (ref 11.0–15.0)
Total Lymphocyte: 43.6 %
WBC: 6.6 10*3/uL (ref 3.8–10.8)

## 2020-08-03 LAB — LEUKEMIA/LYMPHOMA EVALUATION PANEL
NUMBER OF MARKERS:: 22
VIABILITY:: 98 %

## 2020-08-03 LAB — HEMOGLOBIN A1C
Hgb A1c MFr Bld: 6.4 % of total Hgb — ABNORMAL HIGH (ref <5.7)
Mean Plasma Glucose: 137 mg/dL
eAG (mmol/L): 7.6 mmol/L

## 2020-08-03 LAB — LIPID PANEL
Cholesterol: 139 mg/dL (ref <200)
HDL: 38 mg/dL — ABNORMAL LOW (ref >=50)
LDL Cholesterol (Calc): 78 mg/dL (calc)
Non-HDL Cholesterol (Calc): 101 mg/dL (calc) (ref <130)
Total CHOL/HDL Ratio: 3.7 (calc) (ref <5.0)
Triglycerides: 124 mg/dL (ref <150)

## 2020-08-03 LAB — TSH: TSH: 0.38 mIU/L — ABNORMAL LOW (ref 0.40–4.50)

## 2020-08-03 LAB — MAGNESIUM: Magnesium: 2 mg/dL (ref 1.5–2.5)

## 2020-09-17 ENCOUNTER — Telehealth: Payer: Self-pay

## 2020-09-17 DIAGNOSIS — Z006 Encounter for examination for normal comparison and control in clinical research program: Secondary | ICD-10-CM

## 2020-09-17 NOTE — Telephone Encounter (Signed)
I have attempted without success to contact this patient by phone for her Identify 90 day follow up phone call. I left a message for patient to return my phone call with my name and callback number. An e-mail was also sent to patient.  

## 2020-11-23 ENCOUNTER — Other Ambulatory Visit: Payer: Self-pay | Admitting: Internal Medicine

## 2020-11-23 DIAGNOSIS — E039 Hypothyroidism, unspecified: Secondary | ICD-10-CM

## 2020-12-17 ENCOUNTER — Other Ambulatory Visit: Payer: Self-pay | Admitting: Physician Assistant

## 2020-12-17 ENCOUNTER — Other Ambulatory Visit: Payer: Self-pay | Admitting: *Deleted

## 2020-12-17 DIAGNOSIS — Z1231 Encounter for screening mammogram for malignant neoplasm of breast: Secondary | ICD-10-CM

## 2020-12-18 ENCOUNTER — Other Ambulatory Visit: Payer: Self-pay

## 2020-12-18 ENCOUNTER — Ambulatory Visit
Admission: RE | Admit: 2020-12-18 | Discharge: 2020-12-18 | Disposition: A | Discharge status: Home / Self Care | Payer: 59 | Source: Ambulatory Visit | Attending: *Deleted | Admitting: *Deleted

## 2020-12-18 DIAGNOSIS — Z1231 Encounter for screening mammogram for malignant neoplasm of breast: Secondary | ICD-10-CM

## 2021-04-05 ENCOUNTER — Other Ambulatory Visit: Payer: Self-pay | Admitting: Adult Health

## 2021-04-05 DIAGNOSIS — E785 Hyperlipidemia, unspecified: Secondary | ICD-10-CM

## 2021-04-24 ENCOUNTER — Encounter (HOSPITAL_BASED_OUTPATIENT_CLINIC_OR_DEPARTMENT_OTHER): Payer: Self-pay | Admitting: Emergency Medicine

## 2021-04-24 ENCOUNTER — Other Ambulatory Visit: Payer: Self-pay

## 2021-04-24 ENCOUNTER — Emergency Department (HOSPITAL_BASED_OUTPATIENT_CLINIC_OR_DEPARTMENT_OTHER)
Admission: EM | Admit: 2021-04-24 | Discharge: 2021-04-24 | Disposition: A | Discharge status: Home / Self Care | Payer: 59 | Attending: Emergency Medicine | Admitting: Emergency Medicine

## 2021-04-24 ENCOUNTER — Emergency Department (HOSPITAL_BASED_OUTPATIENT_CLINIC_OR_DEPARTMENT_OTHER): Payer: 59

## 2021-04-24 DIAGNOSIS — Z79899 Other long term (current) drug therapy: Secondary | ICD-10-CM | POA: Diagnosis not present

## 2021-04-24 DIAGNOSIS — R109 Unspecified abdominal pain: Secondary | ICD-10-CM | POA: Insufficient documentation

## 2021-04-24 DIAGNOSIS — E039 Hypothyroidism, unspecified: Secondary | ICD-10-CM | POA: Insufficient documentation

## 2021-04-24 LAB — URINALYSIS, ROUTINE W REFLEX MICROSCOPIC
Bilirubin Urine: NEGATIVE
Glucose, UA: NEGATIVE mg/dL
Hgb urine dipstick: NEGATIVE
Ketones, ur: NEGATIVE mg/dL
Nitrite: NEGATIVE
Protein, ur: NEGATIVE mg/dL
Specific Gravity, Urine: 1.025 (ref 1.005–1.030)
pH: 6 (ref 5.0–8.0)

## 2021-04-24 LAB — CBC WITH DIFFERENTIAL/PLATELET
Abs Immature Granulocytes: 0.02 10*3/uL (ref 0.00–0.07)
Basophils Absolute: 0 10*3/uL (ref 0.0–0.1)
Basophils Relative: 0 %
Eosinophils Absolute: 0.1 10*3/uL (ref 0.0–0.5)
Eosinophils Relative: 1 %
HCT: 39.9 % (ref 36.0–46.0)
Hemoglobin: 13.4 g/dL (ref 12.0–15.0)
Immature Granulocytes: 0 %
Lymphocytes Relative: 39 %
Lymphs Abs: 3.8 10*3/uL (ref 0.7–4.0)
MCH: 29.1 pg (ref 26.0–34.0)
MCHC: 33.6 g/dL (ref 30.0–36.0)
MCV: 86.7 fL (ref 80.0–100.0)
Monocytes Absolute: 0.7 10*3/uL (ref 0.1–1.0)
Monocytes Relative: 7 %
Neutro Abs: 5.2 10*3/uL (ref 1.7–7.7)
Neutrophils Relative %: 53 %
Platelets: 308 10*3/uL (ref 150–400)
RBC: 4.6 MIL/uL (ref 3.87–5.11)
RDW: 13.7 % (ref 11.5–15.5)
WBC: 9.7 10*3/uL (ref 4.0–10.5)
nRBC: 0 % (ref 0.0–0.2)

## 2021-04-24 LAB — URINALYSIS, MICROSCOPIC (REFLEX)

## 2021-04-24 LAB — COMPREHENSIVE METABOLIC PANEL
ALT: 66 U/L — ABNORMAL HIGH (ref 0–44)
AST: 38 U/L (ref 15–41)
Albumin: 4.8 g/dL (ref 3.5–5.0)
Alkaline Phosphatase: 57 U/L (ref 38–126)
Anion gap: 9 (ref 5–15)
BUN: 13 mg/dL (ref 8–23)
CO2: 27 mmol/L (ref 22–32)
Calcium: 10 mg/dL (ref 8.9–10.3)
Chloride: 102 mmol/L (ref 98–111)
Creatinine, Ser: 1.03 mg/dL — ABNORMAL HIGH (ref 0.44–1.00)
GFR, Estimated: >60 mL/min (ref >60)
Glucose, Bld: 135 mg/dL — ABNORMAL HIGH (ref 70–99)
Potassium: 3.6 mmol/L (ref 3.5–5.1)
Sodium: 138 mmol/L (ref 135–145)
Total Bilirubin: 0.6 mg/dL (ref 0.3–1.2)
Total Protein: 8.6 g/dL — ABNORMAL HIGH (ref 6.5–8.1)

## 2021-04-24 LAB — LIPASE, BLOOD: Lipase: 29 U/L (ref 11–51)

## 2021-04-24 MED ORDER — KETOROLAC TROMETHAMINE 30 MG/ML IJ SOLN
30.0000 mg | Freq: Once | INTRAMUSCULAR | Status: AC
  Administered 2021-04-24: 30 mg via INTRAMUSCULAR
  Filled 2021-04-24: qty 1

## 2021-04-24 NOTE — ED Triage Notes (Signed)
Pt reports LT flank pain since 1/6

## 2021-04-24 NOTE — ED Notes (Signed)
Patient transported to CT 

## 2021-04-24 NOTE — Discharge Instructions (Signed)
Your work-up today did not show any signs of kidney stones, your work-up is overall reassuring.  I suspect your pain is likely more muscle than abdominal.  Take Tylenol and Motrin for pain, follow-up with your primary doctor in a week.  If the pain changes or worsens return back to the ED for additional reevaluation.

## 2021-04-24 NOTE — ED Provider Notes (Signed)
[facility] Provider Note   CSN: [id number] Arrival date & time: 04/24/21  1837     History  Chief Complaint  Patient presents with   Flank Pain    Vicki Wright is a 62 y.o. female.   Flank Pain Pertinent negatives include no abdominal pain.   Patient is a 61 year old female presenting with left flank pain.  Started 6 days ago, has been intermittent.  Worsened today, no associated nausea, vomiting, hematuria, dysuria.  The pain is aggravated by movement, feels on the left flank and occasionally shooting down her leg.  No previous back injuries or surgeries, denies any strain or injury to the back.  Has not taken any medicine for it at home.  2 previous C-sections, no other abdominal surgeries.  Past medical history of hypercholesterolemia, hypothyroid, osteoarthritis.   Past Medical History:  Diagnosis Date   Abnormal MRI    Colon polyp    Hot flashes    Hypercholesteremia    Hypothyroid    Memory loss    Osteoarthritis      Home Medications Prior to Admission medications   Medication Sig Start Date End Date Taking? Authorizing Provider  atorvastatin (LIPITOR) 40 MG tablet Take 1 tablet Daily for Cholesterol 04/05/21   Unk Pinto, MD  Cholecalciferol (VITAMIN D3) 5000 UNITS TABS Take 5,000 Units by mouth daily.    [provider]  levothyroxine (SYNTHROID) 150 MCG tablet TAKE 1 TABLET DAILY ON AN EMPTY STOMACH WITH ONLY WATER FOR 30 MINUTES & NO ANTACID MEDS, CALCIUM OR MAGNESIUM FOR 4 HOURS & AVOID BIOTIN 11/23/20   Liane Comber, NP  Multiple Vitamins-Minerals (HAIR/SKIN/NAILS PO) Take by mouth daily.    [provider]  vitamin B-12 (CYANOCOBALAMIN) 100 MCG tablet Take 100 mcg by mouth daily.    [provider]      Allergies    Cephalexin    Review of Systems   Review of Systems  Constitutional:  Negative for fever.  Gastrointestinal:  Negative for abdominal pain, constipation, nausea and  vomiting.  Genitourinary:  Positive for flank pain. Negative for dysuria, hematuria, vaginal bleeding and vaginal discharge.  Musculoskeletal:  Positive for back pain.  Skin:  Negative for wound.  Neurological:  Negative for syncope.   Physical Exam Updated Vital Signs BP (!) 155/79 (BP Location: Right Arm)    Pulse 75    Temp 98.6 F (37 C) (Oral)    Resp 16    Ht 5\' 4"  (1.626 m)    Wt 85.3 kg    SpO2 99%    BMI 32.27 kg/m  Physical Exam Vitals and nursing note reviewed. Exam conducted with a chaperone present.  Constitutional:      Appearance: Normal appearance.  HENT:     Head: Normocephalic and atraumatic.  Eyes:     General: No scleral icterus.       Right eye: No discharge.        Left eye: No discharge.     Extraocular Movements: Extraocular movements intact.     Pupils: Pupils are equal, round, and reactive to light.  Cardiovascular:     Rate and Rhythm: Normal rate and regular rhythm.     Pulses: Normal pulses.     Heart sounds: Normal heart sounds. No murmur heard.   No friction rub. No gallop.  Pulmonary:     Effort: Pulmonary effort is normal. No respiratory distress.     Breath sounds: Normal breath sounds.  Abdominal:  General: Abdomen is flat. Bowel sounds are normal. There is no distension.     Palpations: Abdomen is soft.     Tenderness: There is no abdominal tenderness.     Comments: Abdomen is soft nontender, no CVA tenderness.  Musculoskeletal:        General: Tenderness present.     Comments: Paraspinal tenderness to the left, no midline tenderness.  Reproducible pain with straight leg raise.  Skin:    General: Skin is warm and dry.     Coloration: Skin is not jaundiced.  Neurological:     Mental Status: She is alert and oriented to person, place, and time. Mental status is at baseline.     Motor: No weakness.     Coordination: Coordination normal.     Gait: Gait normal.   ED Results / Procedures / Treatments   Labs (all labs ordered are  listed, but only abnormal results are displayed) Labs Reviewed  URINALYSIS, ROUTINE W REFLEX MICROSCOPIC - Abnormal; Notable for the following components:      Result Value   APPearance HAZY (*)    Leukocytes,Ua TRACE (*)    All other components within normal limits  COMPREHENSIVE METABOLIC PANEL - Abnormal; Notable for the following components:   Glucose, Bld 135 (*)    Creatinine, Ser 1.03 (*)    Total Protein 8.6 (*)    ALT 66 (*)    All other components within normal limits  URINALYSIS, MICROSCOPIC (REFLEX) - Abnormal; Notable for the following components:   Bacteria, UA FEW (*)    All other components within normal limits  CBC WITH DIFFERENTIAL/PLATELET  LIPASE, BLOOD    EKG None  Radiology CT Renal Stone Study  Result Date: 04/24/2021 CLINICAL DATA:  Left flank pain for 1 week EXAM: CT ABDOMEN AND PELVIS WITHOUT CONTRAST TECHNIQUE: Multidetector CT imaging of the abdomen and pelvis was performed following the standard protocol without IV contrast. RADIATION DOSE REDUCTION: This exam was performed according to the departmental dose-optimization program which includes automated exposure control, adjustment of the mA and/or kV according to patient size and/or use of iterative reconstruction technique. COMPARISON:  None. FINDINGS: Lower chest: No acute pleural or parenchymal lung disease. Hepatobiliary: Unremarkable unenhanced appearance of the liver and gallbladder. Pancreas: Unremarkable unenhanced appearance. Spleen: Unremarkable unenhanced appearance. Adrenals/Urinary Tract: No urinary tract calculi or obstructive uropathy within either kidney. 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 The adrenals and bladder are unremarkable. Stomach/Bowel: No bowel obstruction or ileus. Normal appendix right lower quadrant. No bowel wall thickening or inflammatory change. Vascular/Lymphatic: Aortic atherosclerosis. No enlarged abdominal or pelvic lymph nodes.  Reproductive: Status post hysterectomy. No adnexal masses. Other: No free fluid or free gas.  No abdominal wall hernia. Musculoskeletal: No acute or destructive bony lesions. Reconstructed images demonstrate no additional findings. IMPRESSION: 1. No evidence of urinary tract calculi or obstructive uropathy. 2.  Aortic Atherosclerosis (ICD10-I70.0). Electronically Signed   By: Randa Ngo M.D.   On: 04/24/2021 21:06    Procedures Procedures    Medications Ordered in ED Medications  ketorolac (TORADOL) 30 MG/ML injection 30 mg (30 mg Intramuscular Given 04/24/21 2132)    ED Course/ Medical Decision Making/ A&P                           Medical Decision Making  Is a 61 year old female presenting with left flank pain.  Her vitals are stable, not febrile.  Benign  physical exam without any CVA tenderness or guarding.  Sounds somewhat like an atypical sciatica given straight leg raise reproduces the pain and its more paraspinal tenderness.  There is no specific CVA tenderness, no focal deficits on neuro exam or obvious trauma.  Given age and consolation of symptoms we will proceed with CT renal.  I considered ovarian involvement but given the lack of lower abdominal tenderness I think that is less likely.  I personally reviewed the laboratory work-up.  My interpretation of pertinent results are below: No leukocytosis or anemia, additionally patient is not febrile is not consistent with a septic stone, UTI or [place].  Patient creatinine slightly elevated but roughly at baseline per chart review.  No signs of acute renal impairment, no gross electrolyte derangement.  Lipase low, not consistent with pancreatitis.    CT renal: I personally reviewed the imaging and agree with radiologist interpretation.  There is no evidence of renal stone, no hydronephrosis concerning for obstruction.  On reassessment patient is pleasant, not in any acute distress.  We discussed the work-up, I expressed I do believe this  is likely more muscle related than an acute abdominal process.  I advised her to follow-up with her PCP and we discussed return precautions.  She was discharged in stable condition.        Final Clinical Impression(s) / ED Diagnoses Final diagnoses:  Flank pain    Rx / DC Orders ED Discharge Orders     None         Sherrill Raring, Hershal Coria 04/24/21 2328    Hayden Rasmussen, MD 04/25/21 1014

## 2021-04-26 ENCOUNTER — Encounter: Payer: 59 | Admitting: Adult Health

## 2021-04-27 NOTE — Progress Notes (Signed)
Complete Physical  Assessment and Plan:  Encounter for general adult medical examination with abnormal findings 1 year  Aortic Atherosclerosis on CT 04/24/21 Control cholesterol, weight and blood pressure  Hypothyroidism, unspecified hypothyroidism type Hypothyroidism-check TSH level, continue medications the same, reminded to take on an empty stomach 30-14mins before food.  Currently taking levothyroxine 150 mcg everyday except Fri, Sat, Sun - TSH  Hyperlipidemia -continue medications, check lipids, decrease fatty foods, increase activity.  - CBC with Differential/Platelet - CMP/GFR - Lipid panel - EKG 12-Lead  Obesity Long discussion about weight loss, diet, and exercise Recommended diet heavy in fruits and veggies and low in animal meats, cheeses, and dairy products, appropriate calorie intake Patient will work on increasing activity, limiting processed carbohydrate and saturated fats Follow up at next visit   Prediabetes Continue weight loss Discussed general issues about diabetes pathophysiology and management.  Agricultural engineer distributed.  Encouraged aerobic exercise. - Hemoglobin A1c  Osteopenia Will get at 63  Osteoarthritis, unspecified osteoarthritis type, unspecified site controlled   Colon polyp Next due 2025   Medication management - Magnesium  Vitamin D deficiency - VITAMIN D 25 Hydroxy (Vit-D Deficiency, Fractures)   Screening for blood or protein in urine - Urinalysis, Routine w reflex microscopic (not at [facility]) - Microalbumin / creatinine urine ratio  Screening for ischemic heart disease -EKG  Abnormal MRI of head Continue follow up neuro; stable 5 year follow up in 2019  Left Flank Pain Unable to reproduce, states pain is not as bad Continue to monitor and if symptoms worsen will refer to orthopedics, normal CT renal study - Urine Culture   Discussed med's effects and SE's. Screening labs and tests as requested with regular  follow-up as recommended. Over 40 minutes of exam, counseling, chart review, and complex, high level critical decision making was performed this visit.   Future Appointments  Date Time Provider Department Center  05/02/2022  2:00 PM Revonda Humphrey, NP [facility] None     HPI  61 y.o. AA female  presents for a complete physical. She has Hyperlipidemia; Abnormal MRI of head; Osteoarthritis; History of colon polyps; Abnormal glucose; Obesity (BMI 30.0-34.9); Osteopenia; Hypothyroidism; Essential hypertension; Mild memory disturbance; Lymphocytosis; and Atypical lymphocytes present on peripheral blood smear on their problem list.  She is divorced, single and happy. 2 daughters, 3 grandchilren close by. She is retired [employer] center.  She was seen in the ER 04/24/21 for left flank pain and had CT scan which showed no evidence of renal stone, no hydronephrosis concerning for obstruction. She is taking Tylenol every 8 hours for pain. Incidental finding of aortic atherosclerosis  No GYN, s/p TAH in 2002. Gets mmg at breast center and is up to date    She has seen Dr. Terrace Arabia for memory disturbance, negative work up June 2017, normal B12, normal ferritin, negative HIV, RPR, she had a positive ANA but negative antiDNA. 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  No enhancement is noted.  There mild changes of chronic microvascular ischemia and chronic paranasal sinusitis.  Overall no significant change compared with MRI dated 02/26/2013.   BMI is Body mass index is 32.3 kg/m., she has been working on diet and exercise, walking daily. She has not been working on diet and exercise Wt Readings from Last 3 Encounters:  04/29/21 188 lb 3.2 oz (85.4 kg)  04/24/21 188 lb (85.3 kg)  07/30/20 182 lb (82.6 kg)   Her  blood pressure has been controlled at home, today their BP is BP: 124/62 BP Readings from Last 3 Encounters:  04/29/21 124/62  04/24/21 (!) 155/79  07/30/20 118/68    She does workout, was walking daily at work until got cold. Concerned due to chest pain, dyspnea when walking with any sort of a load. Mother had several MI.   She is on cholesterol medication, lipitor 40mg  and denies myalgias. Her cholesterol is at goal. The cholesterol last visit was:   Lab Results  Component Value Date   CHOL 139 07/30/2020   HDL 38 (L) 07/30/2020   LDLCALC 78 07/30/2020   TRIG 124 07/30/2020   CHOLHDL 3.7 07/30/2020   She has been working on diet and exercise for prediabetes, and denies paresthesia of the feet, polydipsia, polyuria and visual disturbances.  Last A1C in the office was:  Lab Results  Component Value Date   HGBA1C 6.4 (H) 07/30/2020   Last GFR: Lab Results  Component Value Date   GFRAA 63 07/30/2020   Patient is on Vitamin D supplement, she is on 5000 units. Lab Results  Component Value Date   VD25OH 79 04/23/2020     She is on thyroid medication. Her medication was not changed last visit, she is on 1 pill Monday- Thursday, none on F,S,S. She is on a biotin supplement.  Lab Results  Component Value Date   TSH 0.38 (L) 07/30/2020   She is on B12 supplement;  Lab Results  Component Value Date   VITAMINB12 >2,000 (H) 04/23/2020      Current Medications:   Current Outpatient Medications (Endocrine & Metabolic):    levothyroxine (SYNTHROID) 150 MCG tablet, TAKE 1 TABLET DAILY ON AN EMPTY STOMACH WITH ONLY WATER FOR 30 MINUTES & NO ANTACID MEDS, CALCIUM OR MAGNESIUM FOR 4 HOURS & AVOID BIOTIN  Current Outpatient Medications (Cardiovascular):    atorvastatin (LIPITOR) 40 MG tablet, Take 1 tablet Daily for Cholesterol    Current Outpatient Medications (Hematological):    vitamin B-12 (CYANOCOBALAMIN) 100 MCG tablet, Take 100 mcg by mouth daily.  Current Outpatient  Medications (Other):    Cholecalciferol (VITAMIN D3) 5000 UNITS TABS, Take 5,000 Units by mouth daily.   Multiple Vitamins-Minerals (HAIR/SKIN/NAILS PO), Take by mouth daily.  Health Maintenance:   Immunization History  Administered Date(s) Administered   PFIZER(Purple Top)SARS-COV-2 Vaccination 08/31/2019, 09/23/2019   PPD Test 03/13/2014   Tdap 04/11/2006, 04/10/2017   TDAP: 2018 Pneumovax: N/A Prevnar 13: N/A Flu vaccine: declines Shingles: ask insurance   LMP: Hysterectomy Pap: 2016 PAP with negative HPV - Done due to hysterectomy  MGM: 12/18/20 DEXA: 02/2020 osteopenia- T-1.3 Colonoscopy: 06/2018 5 year recall, family history , due 2025 EGD: N/A  MRI brain 2019- abnormal but unchanged  Last eye: Dr. 2020, Dr. Farris Has, last 2021, goes annually Last dental: Dr. 2022, last 2019, overdue   Patient Care Team: 2020, MD as PCP - General (Internal Medicine) Lucky Cowboy, MD as PCP - Cardiology (Cardiology) Parke Poisson, MD as Consulting Physician (Gastroenterology) Charna Elizabeth, MD as Consulting Physician (Orthopedic Surgery) Marcene Corning, MD as Referring Physician (Internal Medicine)  Medical History:  Past Medical History:  Diagnosis Date   Abnormal MRI    Colon polyp    Hot flashes    Hypercholesteremia    Hypothyroid    Memory loss    Osteoarthritis  Allergies Allergies  Allergen Reactions   Cephalexin     Throat swelling    SURGICAL HISTORY She  has a past surgical history that includes Cesarean section (80 & 82) and Vaginal hysterectomy (2002). FAMILY HISTORY Her family history includes Breast cancer in her maternal aunt, maternal aunt, and paternal aunt; Cancer in her mother, paternal aunt, and paternal aunt; Dementia in her father; Diabetes in her father and mother; Heart disease in her father and mother; Hypertension in her father and mother; Uterine cancer in her paternal aunt. SOCIAL HISTORY She  reports that she has  never smoked. She has never used smokeless tobacco. She reports that she does not drink alcohol and does not use drugs.  Review of Systems: Review of Systems  Constitutional:  Negative for chills, diaphoresis, fever and malaise/fatigue.  HENT: Negative.  Negative for congestion, hearing loss, sinus pain, sore throat and tinnitus.   Eyes: Negative.  Negative for blurred vision and double vision.  Respiratory:  Positive for shortness of breath (exertional). Negative for cough, hemoptysis, sputum production and wheezing.   Cardiovascular:  Negative for chest pain, palpitations, orthopnea, claudication, leg swelling and PND.  Gastrointestinal: Negative.  Negative for abdominal pain, constipation, diarrhea, heartburn, nausea and vomiting.  Genitourinary:  Positive for flank pain (left flank pain- negative renal CT). Negative for dysuria and urgency.  Musculoskeletal:  Negative for back pain, falls, joint pain, myalgias and neck pain.  Skin: Negative.  Negative for rash.  Neurological: Negative.  Negative for dizziness, tingling, tremors, weakness and headaches.  Endo/Heme/Allergies: Negative.  Does not bruise/bleed easily.  Psychiatric/Behavioral: Negative.  Negative for depression and suicidal ideas. The patient is not nervous/anxious and does not have insomnia.    Physical Exam: Estimated body mass index is 32.3 kg/m as calculated from the following:   Height as of this encounter: 5\' 4"  (1.626 m).   Weight as of this encounter: 188 lb 3.2 oz (85.4 kg). BP 124/62    Pulse 68    Temp (!) 97.5 F (36.4 C)    Ht 5\' 4"  (1.626 m)    Wt 188 lb 3.2 oz (85.4 kg)    SpO2 96%    BMI 32.30 kg/m  General Appearance: Well nourished, in no apparent distress.  Eyes: PERRLA, EOMs, conjunctiva no swelling or erythema Sinuses: No Frontal/maxillary tenderness  ENT/Mouth: Ext aud canals clear, normal light reflex with TMs without erythema, bulging. Good dentition. No erythema, swelling, or exudate on post  pharynx. Tonsils not swollen or erythematous. Hearing normal.  Neck: Supple, thyroid normal. No bruits  Respiratory: Respiratory effort normal, BS equal bilaterally without rales, rhonchi, wheezing or stridor.  Cardio: RRR without murmurs, rubs or gallops. Brisk peripheral pulses without edema.  Chest: symmetric, with normal excursions and percussion.  Breasts: declines today  Abdomen: Soft, nontender, no guarding, rebound, hernias, masses, or organomegaly.  Lymphatics: Non tender without lymphadenopathy.  Genitourinary: defer Musculoskeletal: Full ROM all peripheral extremities,5/5 strength, and normal gait, negative straight leg.  Non tender through anterior chest wall Skin: Warm, dry without rashes, lesions, ecchymosis. Neuro: Cranial nerves intact, reflexes equal bilaterally. Normal muscle tone, no cerebellar symptoms. Sensation intact.  Psych: Awake and oriented X 3, normal affect, Insight and Judgment appropriate.   EKG: NSR, no ST changes   Lillias Difrancesco W Yenifer Saccente 2:08 PM [facility]

## 2021-04-29 ENCOUNTER — Encounter: Payer: Self-pay | Admitting: Nurse Practitioner

## 2021-04-29 ENCOUNTER — Ambulatory Visit (INDEPENDENT_AMBULATORY_CARE_PROVIDER_SITE_OTHER): Payer: 59 | Admitting: Nurse Practitioner

## 2021-04-29 ENCOUNTER — Other Ambulatory Visit: Payer: Self-pay

## 2021-04-29 VITALS — BP 124/62 | HR 68 | Temp 97.5°F | Ht 64.0 in | Wt 188.2 lb

## 2021-04-29 DIAGNOSIS — Z0000 Encounter for general adult medical examination without abnormal findings: Secondary | ICD-10-CM

## 2021-04-29 DIAGNOSIS — I700 Atherosclerosis of aorta: Secondary | ICD-10-CM

## 2021-04-29 DIAGNOSIS — Z136 Encounter for screening for cardiovascular disorders: Secondary | ICD-10-CM

## 2021-04-29 DIAGNOSIS — R109 Unspecified abdominal pain: Secondary | ICD-10-CM

## 2021-04-29 DIAGNOSIS — M8580 Other specified disorders of bone density and structure, unspecified site: Secondary | ICD-10-CM

## 2021-04-29 DIAGNOSIS — M1990 Unspecified osteoarthritis, unspecified site: Secondary | ICD-10-CM

## 2021-04-29 DIAGNOSIS — Z1389 Encounter for screening for other disorder: Secondary | ICD-10-CM

## 2021-04-29 DIAGNOSIS — I10 Essential (primary) hypertension: Secondary | ICD-10-CM | POA: Diagnosis not present

## 2021-04-29 DIAGNOSIS — E559 Vitamin D deficiency, unspecified: Secondary | ICD-10-CM

## 2021-04-29 DIAGNOSIS — Z0001 Encounter for general adult medical examination with abnormal findings: Secondary | ICD-10-CM

## 2021-04-29 DIAGNOSIS — Z79899 Other long term (current) drug therapy: Secondary | ICD-10-CM

## 2021-04-29 DIAGNOSIS — R7309 Other abnormal glucose: Secondary | ICD-10-CM

## 2021-04-29 DIAGNOSIS — E785 Hyperlipidemia, unspecified: Secondary | ICD-10-CM

## 2021-04-29 DIAGNOSIS — E039 Hypothyroidism, unspecified: Secondary | ICD-10-CM

## 2021-04-29 DIAGNOSIS — E669 Obesity, unspecified: Secondary | ICD-10-CM

## 2021-04-29 NOTE — Patient Instructions (Signed)

## 2021-04-30 LAB — COMPLETE METABOLIC PANEL WITH GFR
AG Ratio: 1.7 (calc) (ref 1.0–2.5)
ALT: 54 U/L — ABNORMAL HIGH (ref 6–29)
AST: 28 U/L (ref 10–35)
Albumin: 4.6 g/dL (ref 3.6–5.1)
Alkaline phosphatase (APISO): 56 U/L (ref 37–153)
BUN/Creatinine Ratio: 12 (calc) (ref 6–22)
BUN: 13 mg/dL (ref 7–25)
CO2: 34 mmol/L — ABNORMAL HIGH (ref 20–32)
Calcium: 9.8 mg/dL (ref 8.6–10.4)
Chloride: 104 mmol/L (ref 98–110)
Creat: 1.07 mg/dL — ABNORMAL HIGH (ref 0.50–1.05)
Globulin: 2.7 g/dL (calc) (ref 1.9–3.7)
Glucose, Bld: 130 mg/dL — ABNORMAL HIGH (ref 65–99)
Potassium: 4.5 mmol/L (ref 3.5–5.3)
Sodium: 141 mmol/L (ref 135–146)
Total Bilirubin: 0.6 mg/dL (ref 0.2–1.2)
Total Protein: 7.3 g/dL (ref 6.1–8.1)
eGFR: 59 mL/min/{1.73_m2} — ABNORMAL LOW (ref >=60)

## 2021-04-30 LAB — LIPID PANEL
Cholesterol: 158 mg/dL (ref <200)
HDL: 42 mg/dL — ABNORMAL LOW (ref >=50)
LDL Cholesterol (Calc): 94 mg/dL (calc)
Non-HDL Cholesterol (Calc): 116 mg/dL (calc) (ref <130)
Total CHOL/HDL Ratio: 3.8 (calc) (ref <5.0)
Triglycerides: 121 mg/dL (ref <150)

## 2021-04-30 LAB — CBC WITH DIFFERENTIAL/PLATELET
Absolute Monocytes: 600 cells/uL (ref 200–950)
Basophils Absolute: 38 cells/uL (ref 0–200)
Basophils Relative: 0.5 %
Eosinophils Absolute: 60 cells/uL (ref 15–500)
Eosinophils Relative: 0.8 %
HCT: 37.8 % (ref 35.0–45.0)
Hemoglobin: 12.6 g/dL (ref 11.7–15.5)
Lymphs Abs: 3548 cells/uL (ref 850–3900)
MCH: 28.9 pg (ref 27.0–33.0)
MCHC: 33.3 g/dL (ref 32.0–36.0)
MCV: 86.7 fL (ref 80.0–100.0)
MPV: 10.8 fL (ref 7.5–12.5)
Monocytes Relative: 8 %
Neutro Abs: 3255 cells/uL (ref 1500–7800)
Neutrophils Relative %: 43.4 %
Platelets: 279 10*3/uL (ref 140–400)
RBC: 4.36 10*6/uL (ref 3.80–5.10)
RDW: 13.1 % (ref 11.0–15.0)
Total Lymphocyte: 47.3 %
WBC: 7.5 10*3/uL (ref 3.8–10.8)

## 2021-04-30 LAB — HEMOGLOBIN A1C
Hgb A1c MFr Bld: 6.5 % of total Hgb — ABNORMAL HIGH (ref <5.7)
Mean Plasma Glucose: 140 mg/dL
eAG (mmol/L): 7.7 mmol/L

## 2021-04-30 LAB — URINALYSIS, ROUTINE W REFLEX MICROSCOPIC
Bilirubin Urine: NEGATIVE
Glucose, UA: NEGATIVE
Hgb urine dipstick: NEGATIVE
Ketones, ur: NEGATIVE
Leukocytes,Ua: NEGATIVE
Nitrite: NEGATIVE
Protein, ur: NEGATIVE
Specific Gravity, Urine: 1.019 (ref 1.001–1.035)
pH: 5.5 (ref 5.0–8.0)

## 2021-04-30 LAB — TSH: TSH: 0.57 mIU/L (ref 0.40–4.50)

## 2021-04-30 LAB — VITAMIN D 25 HYDROXY (VIT D DEFICIENCY, FRACTURES): Vit D, 25-Hydroxy: 51 ng/mL (ref 30–100)

## 2021-04-30 LAB — URINE CULTURE
MICRO NUMBER:: 12892882
Result:: NO GROWTH
SPECIMEN QUALITY:: ADEQUATE

## 2021-04-30 LAB — MICROALBUMIN / CREATININE URINE RATIO
Creatinine, Urine: 146 mg/dL (ref 20–275)
Microalb Creat Ratio: 5 mcg/mg creat (ref <30)
Microalb, Ur: 0.7 mg/dL

## 2021-04-30 LAB — MAGNESIUM: Magnesium: 2.1 mg/dL (ref 1.5–2.5)

## 2021-06-04 ENCOUNTER — Encounter: Payer: Self-pay | Admitting: Nurse Practitioner

## 2021-08-27 ENCOUNTER — Ambulatory Visit: Payer: 59 | Admitting: Internal Medicine

## 2021-08-27 ENCOUNTER — Encounter: Payer: Self-pay | Admitting: Internal Medicine

## 2021-08-27 VITALS — BP 130/66 | HR 64 | Ht 63.5 in | Wt 184.2 lb

## 2021-08-27 DIAGNOSIS — R9431 Abnormal electrocardiogram [ECG] [EKG]: Secondary | ICD-10-CM | POA: Diagnosis not present

## 2021-08-27 DIAGNOSIS — E785 Hyperlipidemia, unspecified: Secondary | ICD-10-CM

## 2021-08-27 DIAGNOSIS — R079 Chest pain, unspecified: Secondary | ICD-10-CM

## 2021-08-27 NOTE — Patient Instructions (Signed)
Medication Instructions:  PLEASE DO NOT TAKE YOUR ATORVASTATIN FOR 2 WEEKS- PLEASE CALL TO LET us KNOW HOW YOU ARE DOING OR SEND [place]  *If you need a refill on your cardiac medications before your next appointment, please call your pharmacy*  Testing/Procedures: Your physician has requested that you have an echocardiogram. Echocardiography is a painless test that uses sound waves to create images of your heart. It provides your doctor with information about the size and shape of your heart and how well your heart's chambers and valves are working. You may receive an ultrasound enhancing agent through an IV if needed to better visualize your heart during the echo.This procedure takes approximately one hour. There are no restrictions for this procedure. This will take place at the [address].   Follow-Up: At [facility], you and your health needs are our priority.  As part of our continuing mission to provide you with exceptional heart care, we have created designated Provider Care Teams.  These Care Teams include your primary Cardiologist (physician) and Advanced Practice Providers (APPs -  Physician Assistants and Nurse Practitioners) who all work together to provide you with the care you need, when you need it.  Your next appointment:   6 month(s)  The format for your next appointment:   In Person  Provider:   Elouise Munroe, MD

## 2021-08-27 NOTE — Progress Notes (Signed)
Cardiology Office Note:    Date:  08/27/2021  ID:  Vicki Wright, DOB 01-May-1960, MRN [id number]  PCP:  Unk Pinto, MD  Cardiologist:  Elouise Munroe, MD  Electrophysiologist:  None   Referring MD: Unk Pinto, MD   Chief Complaint/Reason for Referral: Follow-up chest pain  History of Present Illness:    Vicki Wright is a 61 y.o. female with a history of hyperlipidemia and prediabetes presents for follow-up of chest pain.  Coronary CTA 06/02/20 showed minimal CAD and coronary calcium score of 8.  She notes discomfort in her chest with features of possible cardiac chest pain. Substernal chest pain while lying down to sleep. Not significant while active. Occurring for approximately last 2 months, she wrote to her PCP with chest pain in February suggesting longer time frame. We reviewed that with reassuring CCTA this is unlikely ischemic. We discussed statin holiday for possible manifestation of myalgias, as well as echo to evaluate for other structural causes of chest pain.  The patient denies dyspnea at rest or with exertion, palpitations, PND, orthopnea, or leg swelling. Denies cough, fever, chills. Denies nausea, vomiting. Denies syncope or presyncope. Denies dizziness or lightheadedness.  Past Medical History:  Diagnosis Date   Abnormal MRI    Colon polyp    Hot flashes    Hypercholesteremia    Hypothyroid    Memory loss    Osteoarthritis     Past Surgical History:  Procedure Laterality Date   CESAREAN SECTION  80 & 82   VAGINAL HYSTERECTOMY  2002   TAH    Current Medications: Current Meds  Medication Sig   atorvastatin (LIPITOR) 40 MG tablet Take 1 tablet Daily for Cholesterol   Cholecalciferol (VITAMIN D3) 5000 UNITS TABS Take 5,000 Units by mouth daily.   levothyroxine (SYNTHROID) 150 MCG tablet TAKE 1 TABLET DAILY ON AN EMPTY STOMACH WITH ONLY WATER FOR 30 MINUTES & NO ANTACID MEDS, CALCIUM OR MAGNESIUM FOR 4 HOURS & AVOID BIOTIN   Multiple  Vitamins-Minerals (HAIR/SKIN/NAILS PO) Take by mouth daily.   vitamin B-12 (CYANOCOBALAMIN) 100 MCG tablet Take 100 mcg by mouth daily.     Allergies:   Cephalexin   Social History   Tobacco Use   Smoking status: Never   Smokeless tobacco: Never  Vaping Use   Vaping Use: Never used  Substance Use Topics   Alcohol use: No   Drug use: No     Family History: The patient's family history includes Breast cancer in her maternal aunt, maternal aunt, and paternal aunt; Cancer in her mother, paternal aunt, and paternal aunt; Dementia in her father; Diabetes in her father and mother; Heart disease in her father and mother; Hypertension in her father and mother; Uterine cancer in her paternal aunt.  ROS:   Please see the history of present illness.    All other systems reviewed and are negative.  EKGs/Labs/Other Studies Reviewed:    The following studies were reviewed today:  EKG: NSR, nonspecific T wave abnormality  Recent Labs: 04/29/2021: ALT 54; BUN 13; Creat 1.07; Hemoglobin 12.6; Magnesium 2.1; Platelets 279; Potassium 4.5; Sodium 141; TSH 0.57  Recent Lipid Panel    Component Value Date/Time   CHOL 158 04/29/2021 1433   TRIG 121 04/29/2021 1433   HDL 42 (L) 04/29/2021 1433   CHOLHDL 3.8 04/29/2021 1433   VLDL 19 06/27/2016 1549   LDLCALC 94 04/29/2021 1433    Physical Exam:    VS:  BP 130/66   Pulse 64  Ht 5' 3.5" (1.613 m)   Wt 184 lb 3.2 oz (83.6 kg)   SpO2 98%   BMI 32.12 kg/m     Wt Readings from Last 5 Encounters:  08/27/21 184 lb 3.2 oz (83.6 kg)  04/29/21 188 lb 3.2 oz (85.4 kg)  04/24/21 188 lb (85.3 kg)  07/30/20 182 lb (82.6 kg)  07/14/20 182 lb 3.2 oz (82.6 kg)    Constitutional: No acute distress Eyes: sclera non-icteric, normal conjunctiva and lids ENMT: normal dentition, moist mucous membranes Cardiovascular: regular rhythm, normal rate, no murmurs. S1 and S2 normal. Radial pulses normal bilaterally. No jugular venous distention.   Respiratory: clear to auscultation bilaterally GI : normal bowel sounds, soft and nontender. No distention.   MSK: extremities warm, well perfused. No edema.  NEURO: grossly nonfocal exam, moves all extremities. PSYCH: alert and oriented x 3, normal mood and affect.   ASSESSMENT:    1. Chest pain of uncertain etiology   2. Nonspecific abnormal electrocardiogram (ECG) (EKG)     PLAN:    Precordial pain -possible cardiac pain, coronary CTA low risk with minimal CAD.  Will order echo to evaluate for pericardial disease with positional pain, or other structural abnormality with no prior echo and no significant CAD. - wants to try a statin holiday to see if this will improve chest pain, ok to hold for 2 weeks and we will reassess at close follow up. Should remain on statin longitudinally as below if tolerated.  Elevated BP without diagnosis of hypertension -Mildly elevated BP, no antiHTN on board. Will assess for LVH on echo, if present would start low dose antihypertensive agent.   Hyperlipidemia, unspecified hyperlipidemia type -goal LDL is less than 70, pt will take statin holiday for benefit of chest pain and if no improvement, should resume statin atorvastatin 40 mg daily.   Palpitations-these have resolved, observe.   Total time of encounter: 30 minutes total time of encounter, including 20 minutes spent in face-to-face patient care on the date of this encounter. This time includes coordination of care and counseling regarding above mentioned problem list. Remainder of non-face-to-face time involved reviewing chart documents/testing relevant to the patient encounter and documentation in the medical record. I have independently reviewed documentation from referring provider.   Weston Brass, MD, [facility]  [facility]    Medication Adjustments/Labs and Tests Ordered: Current medicines are reviewed at length with the patient today.  Concerns regarding medicines are  outlined above.   Orders Placed This Encounter  Procedures   EKG 12-Lead   ECHOCARDIOGRAM COMPLETE     No orders of the defined types were placed in this encounter.   Patient Instructions  Medication Instructions:  PLEASE DO NOT TAKE YOUR ATORVASTATIN FOR 2 WEEKS- PLEASE CALL TO LET us KNOW HOW YOU ARE DOING OR SEND MYCHART MESSAGE  *If you need a refill on your cardiac medications before your next appointment, please call your pharmacy*  Testing/Procedures: Your physician has requested that you have an echocardiogram. Echocardiography is a painless test that uses sound waves to create images of your heart. It provides your doctor with information about the size and shape of your heart and how well your heart's chambers and valves are working. You may receive an ultrasound enhancing agent through an IV if needed to better visualize your heart during the echo.This procedure takes approximately one hour. There are no restrictions for this procedure. This will take place at the [address].  Follow-Up: At [facility], you and your health needs are our priority.  As part of our continuing mission to provide you with exceptional heart care, we have created designated Provider Care Teams.  These Care Teams include your primary Cardiologist (physician) and Advanced Practice Providers (APPs -  Physician Assistants and Nurse Practitioners) who all work together to provide you with the care you need, when you need it.  Your next appointment:   6 month(s)  The format for your next appointment:   In Person  Provider:   Elouise Munroe, MD

## 2021-09-07 ENCOUNTER — Ambulatory Visit (HOSPITAL_COMMUNITY): Payer: 59 | Attending: Cardiovascular Disease

## 2021-09-07 DIAGNOSIS — R9431 Abnormal electrocardiogram [ECG] [EKG]: Secondary | ICD-10-CM

## 2021-09-07 DIAGNOSIS — R079 Chest pain, unspecified: Secondary | ICD-10-CM

## 2021-09-08 ENCOUNTER — Ambulatory Visit: Payer: 59 | Admitting: Internal Medicine

## 2021-09-08 LAB — ECHOCARDIOGRAM COMPLETE
Area-P 1/2: 2.91 cm2
S' Lateral: 2.4 cm

## 2021-09-27 DIAGNOSIS — E785 Hyperlipidemia, unspecified: Secondary | ICD-10-CM

## 2021-10-08 ENCOUNTER — Encounter: Payer: Self-pay | Admitting: Nurse Practitioner

## 2021-10-26 NOTE — Progress Notes (Signed)
6 MONTH FOLLOW UP  Assessment and Plan:  Hypothyroidism, unspecified hypothyroidism type Hypothyroidism-check TSH level, continue medications the same, reminded to take on an empty stomach 30-59mins before food.  - TSH  Essential Hypertension - continue medications, DASH diet, exercise and monitor at home. Call if greater than 130/80.  - CBC, CMP  Aortic Atherosclerosis(HCC) Continue to monitor blood pressure, blood sugars, cholesterol ans weight  Hyperlipidemia - She was taken off of Atorvastatin 40 mg by cardiologist -  decrease fatty foods, increase activity.  - Lipid panel  Abnormal Glucose Continue weight loss Discussed general issues about diabetes pathophysiology and management.  Agricultural engineer distributed.  Encouraged aerobic exercise. - Hemoglobin A1c   Medication management Continued  Vitamin D deficiency At goal at recent check; continue to recommend supplementation for goal of 60-100 Defer vitamin D level  Right Heel Pain Will get xray of right foot and ankle Treat pending results  Discussed med's effects and SE's. Screening labs and tests as requested with regular follow-up as recommended. Over 40 minutes of exam, counseling, chart review, and complex, high level critical decision making was performed this visit.   Future Appointments  Date Time Provider Department Center  10/28/2021 10:00 AM [facility] PHARMACIST [facility]  05/02/2022  2:00 PM Raynelle Dick, NP [facility] None     HPI  61 y.o. AA female  presents for a 3 month follow up for htn, hyperlipidemia, prediabetes, obesity, hypothyroid, vitamin D def.    She is having pain in her right heel , sharp pain that happens when pressure is put on the foot.  She will occasionally have a sharp electrical type pain that can radiate from heel to toes.  Occurs when just sitting as well. This has been occurring x several months.     BMI is Body mass index is 31.56 kg/m., she has been  working on diet and exercise, walking daily weather permitting. She has been eating more salads, doing grilled fish, beans.  Wt Readings from Last 3 Encounters:  10/27/21 181 lb (82.1 kg)  08/27/21 184 lb 3.2 oz (83.6 kg)  04/29/21 188 lb 3.2 oz (85.4 kg)   Her blood pressure has been controlled at home, today their BP is BP: 118/64 BP Readings from Last 3 Encounters:  10/27/21 118/64  08/27/21 130/66  04/29/21 124/62  She does workout, was walking daily at work until got cold. Denies chest pain, dyspnea, dizziness.   Had low risk coronary CT calcium score of 8 on 06/02/2020  She was on Atorvastatin 40 mg but was d/c'd by cardiologist due to muscle pain .  Has appointment with pharmacist at their office to discuss medication.  The pain did resolve. Her cholesterol is at goal. The cholesterol last visit was:   Lab Results  Component Value Date   CHOL 158 04/29/2021   HDL 42 (L) 04/29/2021   LDLCALC 94 04/29/2021   TRIG 121 04/29/2021   CHOLHDL 3.8 04/29/2021   She has been working on diet and exercise for prediabetes, and denies paresthesia of the feet, polydipsia, polyuria and visual disturbances. Last A1C in the office was:  Lab Results  Component Value Date   HGBA1C 6.5 (H) 04/29/2021   Last GFR: Lab Results  Component Value Date   GFRAA 63 07/30/2020   Patient is on Vitamin D supplement, she is on 5000 units. Lab Results  Component Value Date   VD25OH 51 04/29/2021     She is on thyroid medication. Her medication was not  changed last visit, she is on 1 pill Monday- Thursday, none on F,S,S. She is on a biotin supplement.  Lab Results  Component Value Date   TSH 0.57 04/29/2021      Current Medications:   Current Outpatient Medications (Endocrine & Metabolic):    levothyroxine (SYNTHROID) 150 MCG tablet, TAKE 1 TABLET DAILY ON AN EMPTY STOMACH WITH ONLY WATER FOR 30 MINUTES & NO ANTACID MEDS, CALCIUM OR MAGNESIUM FOR 4 HOURS & AVOID BIOTIN  Current Outpatient  Medications (Cardiovascular):    atorvastatin (LIPITOR) 40 MG tablet, Take 1 tablet Daily for Cholesterol (Patient not taking: Reported on 10/27/2021)    Current Outpatient Medications (Hematological):    vitamin B-12 (CYANOCOBALAMIN) 100 MCG tablet, Take 100 mcg by mouth daily.  Current Outpatient Medications (Other):    BIOTIN PO, Take by mouth.   Cholecalciferol (VITAMIN D3) 5000 UNITS TABS, Take 5,000 Units by mouth daily.   Multiple Vitamins-Minerals (HAIR/SKIN/NAILS PO), Take by mouth daily.  Medical History:  Past Medical History:  Diagnosis Date   Abnormal MRI    Colon polyp    Hot flashes    Hypercholesteremia    Hypothyroid    Memory loss    Osteoarthritis    Allergies Allergies  Allergen Reactions   Cephalexin     Throat swelling    SURGICAL HISTORY She  has a past surgical history that includes Cesarean section (80 & 82) and Vaginal hysterectomy (2002). FAMILY HISTORY Her family history includes Breast cancer in her maternal aunt, maternal aunt, and paternal aunt; Cancer in her mother, paternal aunt, and paternal aunt; Dementia in her father; Diabetes in her father and mother; Heart disease in her father and mother; Hypertension in her father and mother; Uterine cancer in her paternal aunt. SOCIAL HISTORY She  reports that she has never smoked. She has never used smokeless tobacco. She reports that she does not drink alcohol and does not use drugs.   Review of Systems: Review of Systems  Constitutional:  Negative for diaphoresis, malaise/fatigue and weight loss.  HENT: Negative.  Negative for hearing loss and tinnitus.   Eyes: Negative.  Negative for blurred vision and double vision.  Respiratory:  Negative for cough, hemoptysis, sputum production, shortness of breath and wheezing.   Cardiovascular:  Negative for chest pain, palpitations, orthopnea, claudication, leg swelling and PND.  Gastrointestinal: Negative.  Negative for abdominal pain, blood in stool,  constipation, diarrhea, heartburn, melena, nausea and vomiting.  Genitourinary: Negative.   Musculoskeletal:  Positive for joint pain (right heel and foot). Negative for myalgias.  Skin: Negative.  Negative for rash.  Neurological: Negative.  Negative for dizziness, tingling, sensory change, weakness and headaches.  Endo/Heme/Allergies: Negative.  Negative for polydipsia.  Psychiatric/Behavioral: Negative.    All other systems reviewed and are negative.   Physical Exam: Estimated body mass index is 31.56 kg/m as calculated from the following:   Height as of this encounter: 5' 3.5" (1.613 m).   Weight as of this encounter: 181 lb (82.1 kg). BP 118/64   Pulse 69   Temp (!) 97.2 F (36.2 C)   Ht 5' 3.5" (1.613 m)   Wt 181 lb (82.1 kg)   SpO2 96%   BMI 31.56 kg/m  General Appearance: Well nourished, in no apparent distress.  Eyes: PERRLA, EOMs, conjunctiva no swelling or erythema Sinuses: No Frontal/maxillary tenderness  ENT/Mouth: Ext aud canals clear, normal light reflex with TMs without erythema, bulging. Good dentition. No erythema, swelling, or exudate on post pharynx. Tonsils not  swollen or erythematous. Hearing normal.  Neck: Supple, thyroid normal. No bruits  Respiratory: Respiratory effort normal, BS equal bilaterally without rales, rhonchi, wheezing or stridor.  Cardio: RRR without murmurs, rubs or gallops. Brisk peripheral pulses without edema.  Chest: symmetric, with normal excursions and percussion.  Abdomen: Soft, nontender, no guarding, rebound, hernias, masses, or organomegaly.  Lymphatics: Non tender without lymphadenopathy.  Musculoskeletal: Full ROM all peripheral extremities,5/5 strength, and normal gait. Unable to reproduce right heel pain on exam Skin: Warm, dry without rashes, lesions, ecchymosis. Neuro: Cranial nerves intact, reflexes equal bilaterally. Normal muscle tone, no cerebellar symptoms. Sensation intact.  Psych: Awake and oriented X 3, normal affect,  Insight and Judgment appropriate.     Burlon Centrella E  11:50 AM [facility]

## 2021-10-27 ENCOUNTER — Ambulatory Visit: Payer: 59 | Admitting: Nurse Practitioner

## 2021-10-27 ENCOUNTER — Encounter: Payer: Self-pay | Admitting: Nurse Practitioner

## 2021-10-27 VITALS — BP 118/64 | HR 69 | Temp 97.2°F | Ht 63.5 in | Wt 181.0 lb

## 2021-10-27 DIAGNOSIS — E785 Hyperlipidemia, unspecified: Secondary | ICD-10-CM

## 2021-10-27 DIAGNOSIS — I10 Essential (primary) hypertension: Secondary | ICD-10-CM | POA: Diagnosis not present

## 2021-10-27 DIAGNOSIS — I700 Atherosclerosis of aorta: Secondary | ICD-10-CM

## 2021-10-27 DIAGNOSIS — M79671 Pain in right foot: Secondary | ICD-10-CM

## 2021-10-27 DIAGNOSIS — E559 Vitamin D deficiency, unspecified: Secondary | ICD-10-CM

## 2021-10-27 DIAGNOSIS — R7309 Other abnormal glucose: Secondary | ICD-10-CM | POA: Diagnosis not present

## 2021-10-27 DIAGNOSIS — Z79899 Other long term (current) drug therapy: Secondary | ICD-10-CM

## 2021-10-27 DIAGNOSIS — E1122 Type 2 diabetes mellitus with diabetic chronic kidney disease: Secondary | ICD-10-CM

## 2021-10-27 DIAGNOSIS — E039 Hypothyroidism, unspecified: Secondary | ICD-10-CM

## 2021-10-27 DIAGNOSIS — N183 Chronic kidney disease, stage 3 unspecified: Secondary | ICD-10-CM

## 2021-10-27 NOTE — Patient Instructions (Signed)
Xray of right foot and ankle ordered at [facility] [address] Walk In M-F 8:30-3:45   High Cholesterol  High cholesterol is a condition in which the blood has high levels of a white, waxy substance similar to fat (cholesterol). The liver makes all the cholesterol that the body needs. The human body needs small amounts of cholesterol to help build cells. A person gets extra or excess cholesterol from the food that he or she eats. The blood carries cholesterol from the liver to the rest of the body. If you have high cholesterol, deposits (plaques) may build up on the walls of your arteries. Arteries are the blood vessels that carry blood away from your heart. These plaques make the arteries narrow and stiff. Cholesterol plaques increase your risk for heart attack and stroke. Work with your health care provider to keep your cholesterol levels in a healthy range. What increases the risk? The following factors may make you more likely to develop this condition: Eating foods that are high in animal fat (saturated fat) or cholesterol. Being overweight. Not getting enough exercise. A family history of high cholesterol (familial hypercholesterolemia). Use of tobacco products. Having diabetes. What are the signs or symptoms? In most cases, high cholesterol does not usually cause any symptoms. In severe cases, very high cholesterol levels can cause: Fatty bumps under the skin (xanthomas). A white or gray ring around the black center (pupil) of the eye. How is this diagnosed? This condition may be diagnosed based on the results of a blood test. If you are older than 63 years of age, your health care provider may check your cholesterol levels every 4-6 years. You may be checked more often if you have high cholesterol or other risk factors for heart disease. The blood test for cholesterol measures: "Bad" cholesterol, or LDL cholesterol. This is the main type of cholesterol that causes  heart disease. The desired level is less than 100 mg/dL (3.81 mmol/L). "Good" cholesterol, or HDL cholesterol. HDL helps protect against heart disease by cleaning the arteries and carrying the LDL to the liver for processing. The desired level for HDL is 60 mg/dL (0.17 mmol/L) or higher. Triglycerides. These are fats that your body can store or burn for energy. The desired level is less than 150 mg/dL (5.10 mmol/L). Total cholesterol. This measures the total amount of cholesterol in your blood and includes LDL, HDL, and triglycerides. The desired level is less than 200 mg/dL (2.58 mmol/L). How is this treated? Treatment for high cholesterol starts with lifestyle changes, such as diet and exercise. Diet changes. You may be asked to eat foods that have more fiber and less saturated fats or added sugar. Lifestyle changes. These may include regular exercise, maintaining a healthy weight, and quitting use of tobacco products. Medicines. These are given when diet and lifestyle changes have not worked. You may be prescribed a statin medicine to help lower your cholesterol levels. Follow these instructions at home: Eating and drinking  Eat a healthy, balanced diet. This diet includes: Daily servings of a variety of fresh, frozen, or canned fruits and vegetables. Daily servings of whole grain foods that are rich in fiber. Foods that are low in saturated fats and trans fats. These include poultry and fish without skin, lean cuts of meat, and low-fat dairy products. A variety of fish, especially oily fish that contain omega-3 fatty acids. Aim to eat fish at least 2 times a week. Avoid foods and drinks that have added sugar. Use  healthy cooking methods, such as roasting, grilling, broiling, baking, poaching, steaming, and stir-frying. Do not fry your food except for stir-frying. If you drink alcohol: Limit how much you have to: 0-1 drink a day for women who are not pregnant. 0-2 drinks a day for men. Know  how much alcohol is in a drink. In the [place] one drink equals one 12 oz bottle of beer (355 mL), one 5 oz glass of wine (148 mL), or one 1 oz glass of hard liquor (44 mL). Lifestyle  Get regular exercise. Aim to exercise for a total of 150 minutes a week. Increase your activity level by doing activities such as gardening, walking, and taking the stairs. Do not use any products that contain nicotine or tobacco. These products include cigarettes, chewing tobacco, and vaping devices, such as e-cigarettes. If you need help quitting, ask your health care provider. General instructions Take over-the-counter and prescription medicines only as told by your health care provider. Keep all follow-up visits. This is important. Where to find more information American Heart Association: www.heart.org National Heart, Lung, and Blood Institute: PopSteam.is Contact a health care provider if: You have trouble achieving or maintaining a healthy diet or weight. You are starting an exercise program. You are unable to stop smoking. Get help right away if: You have chest pain. You have trouble breathing. You have discomfort or pain in your jaw, neck, back, shoulder, or arm. You have any symptoms of a stroke. "BE FAST" is an easy way to remember the main warning signs of a stroke: B - Balance. Signs are dizziness, sudden trouble walking, or loss of balance. E - Eyes. Signs are trouble seeing or a sudden change in vision. F - Face. Signs are sudden weakness or numbness of the face, or the face or eyelid drooping on one side. A - Arms. Signs are weakness or numbness in an arm. This happens suddenly and usually on one side of the body. S - Speech. Signs are sudden trouble speaking, slurred speech, or trouble understanding what people say. T - Time. Time to call emergency services. Write down what time symptoms started. You have other signs of a stroke, such as: A sudden, severe headache with no known  cause. Nausea or vomiting. Seizure. These symptoms may represent a serious problem that is an emergency. Do not wait to see if the symptoms will go away. Get medical help right away. Call your local emergency services (911 in the [place] Do not drive yourself to the hospital. Summary Cholesterol plaques increase your risk for heart attack and stroke. Work with your health care provider to keep your cholesterol levels in a healthy range. Eat a healthy, balanced diet, get regular exercise, and maintain a healthy weight. Do not use any products that contain nicotine or tobacco. These products include cigarettes, chewing tobacco, and vaping devices, such as e-cigarettes. Get help right away if you have any symptoms of a stroke. This information is not intended to replace advice given to you by your health care provider. Make sure you discuss any questions you have with your health care provider. Document Revised: 06/11/2020 Document Reviewed: 06/01/2020 Elsevier Patient Education  2023 [employer].

## 2021-10-28 ENCOUNTER — Encounter: Payer: Self-pay | Admitting: Pharmacist Clinician (PhC)/ Clinical Pharmacy Specialist

## 2021-10-28 ENCOUNTER — Ambulatory Visit: Payer: 59 | Admitting: Pharmacist Clinician (PhC)/ Clinical Pharmacy Specialist

## 2021-10-28 DIAGNOSIS — E7801 Familial hypercholesterolemia: Secondary | ICD-10-CM

## 2021-10-28 LAB — HEMOGLOBIN A1C
Hgb A1c MFr Bld: 6.1 % of total Hgb — ABNORMAL HIGH (ref <5.7)
Mean Plasma Glucose: 128 mg/dL
eAG (mmol/L): 7.1 mmol/L

## 2021-10-28 LAB — CBC WITH DIFFERENTIAL/PLATELET
Absolute Monocytes: 501 cells/uL (ref 200–950)
Basophils Absolute: 52 cells/uL (ref 0–200)
Basophils Relative: 0.8 %
Eosinophils Absolute: 59 cells/uL (ref 15–500)
Eosinophils Relative: 0.9 %
HCT: 38.1 % (ref 35.0–45.0)
Hemoglobin: 13.1 g/dL (ref 11.7–15.5)
Lymphs Abs: 3413 cells/uL (ref 850–3900)
MCH: 29.8 pg (ref 27.0–33.0)
MCHC: 34.4 g/dL (ref 32.0–36.0)
MCV: 86.6 fL (ref 80.0–100.0)
MPV: 11.4 fL (ref 7.5–12.5)
Monocytes Relative: 7.7 %
Neutro Abs: 2477 cells/uL (ref 1500–7800)
Neutrophils Relative %: 38.1 %
Platelets: 305 10*3/uL (ref 140–400)
RBC: 4.4 10*6/uL (ref 3.80–5.10)
RDW: 13.3 % (ref 11.0–15.0)
Total Lymphocyte: 52.5 %
WBC: 6.5 10*3/uL (ref 3.8–10.8)

## 2021-10-28 LAB — COMPLETE METABOLIC PANEL WITH GFR
AG Ratio: 1.6 (calc) (ref 1.0–2.5)
ALT: 55 U/L — ABNORMAL HIGH (ref 6–29)
AST: 29 U/L (ref 10–35)
Albumin: 4.5 g/dL (ref 3.6–5.1)
Alkaline phosphatase (APISO): 52 U/L (ref 37–153)
BUN/Creatinine Ratio: 10 (calc) (ref 6–22)
BUN: 11 mg/dL (ref 7–25)
CO2: 24 mmol/L (ref 20–32)
Calcium: 9.9 mg/dL (ref 8.6–10.4)
Chloride: 105 mmol/L (ref 98–110)
Creat: 1.14 mg/dL — ABNORMAL HIGH (ref 0.50–1.05)
Globulin: 2.9 g/dL (calc) (ref 1.9–3.7)
Glucose, Bld: 106 mg/dL — ABNORMAL HIGH (ref 65–99)
Potassium: 4.4 mmol/L (ref 3.5–5.3)
Sodium: 140 mmol/L (ref 135–146)
Total Bilirubin: 0.5 mg/dL (ref 0.2–1.2)
Total Protein: 7.4 g/dL (ref 6.1–8.1)
eGFR: 54 mL/min/{1.73_m2} — ABNORMAL LOW (ref >=60)

## 2021-10-28 LAB — LIPID PANEL
Cholesterol: 278 mg/dL — ABNORMAL HIGH (ref <200)
HDL: 44 mg/dL — ABNORMAL LOW (ref >=50)
LDL Cholesterol (Calc): 201 mg/dL (calc) — ABNORMAL HIGH
Non-HDL Cholesterol (Calc): 234 mg/dL (calc) — ABNORMAL HIGH (ref <130)
Total CHOL/HDL Ratio: 6.3 (calc) — ABNORMAL HIGH (ref <5.0)
Triglycerides: 161 mg/dL — ABNORMAL HIGH (ref <150)

## 2021-10-28 LAB — TSH: TSH: 0.54 mIU/L (ref 0.40–4.50)

## 2021-10-28 MED ORDER — REPATHA SURECLICK 140 MG/ML ~~LOC~~ SOAJ: 140.0000 mg | SUBCUTANEOUS | 12 refills | Status: DC

## 2021-10-28 NOTE — Progress Notes (Signed)
   10/28/2021 Vicki Wright 05/11/1960 [id number]   HPI:  Vicki Wright is a 61 y.o. female patient of Dr Jacques Navy, who presents today for a lipid clinic evaluation.  See pertinent past medical history below.  Dr. Jacques Navy ordered a coronary calcium score because of chest tightness/ache and patient was found to have a score of 8, which put her in the 75th percentile for matched controls.  Patient notes that since stopping atorvastatin she has had significantly less chest wall ache and her hips are also less achy.  Labs drawn after discontinuation of atorvastatin show an LDL of 201, indicating familial hyperlipidemia.   She is here today to discuss options for lowering cholesterol.    Past Medical History: hypertension Controlled with lifestyle management  hypothyroidism 7/23 TSH WNL on levothyroxine 150   Diabetes A1c 6.1 (has hit 6.5 twice, both in [place] - 2021,2022), no meds  obesity BMI 31.56 - stable   Current Medications: none  Cholesterol Goals: LDL < 70   Intolerant/previously tried: simvastatin, atorvastatin  Family history: mother with multiple MI's died from cancer, father now seeing cardiologist - 51 years old; 3 sisters without heart issues; 2 daughters both healthy  Diet: mix of home and eating out, some fast foods; tries to avoid fried foods; loves squash, zucchini; trying to avoid snack foods, trying to watch weight  Exercise:  walking at least 4 days per week, 1 to 1.5 miles/day  Labs: 7/23 TC 278, TG 161, HDL 44, LDL 201   Current Outpatient Medications  Medication Sig Dispense Refill   atorvastatin (LIPITOR) 40 MG tablet Take 1 tablet Daily for Cholesterol (Patient not taking: Reported on 10/27/2021) 90 tablet 3   BIOTIN PO Take by mouth.     Cholecalciferol (VITAMIN D3) 5000 UNITS TABS Take 5,000 Units by mouth daily.     levothyroxine (SYNTHROID) 150 MCG tablet TAKE 1 TABLET DAILY ON AN EMPTY STOMACH WITH ONLY WATER FOR 30 MINUTES & NO ANTACID MEDS,  CALCIUM OR MAGNESIUM FOR 4 HOURS & AVOID BIOTIN 90 tablet 3   Multiple Vitamins-Minerals (HAIR/SKIN/NAILS PO) Take by mouth daily.     vitamin B-12 (CYANOCOBALAMIN) 100 MCG tablet Take 100 mcg by mouth daily.     No current facility-administered medications for this visit.    Allergies  Allergen Reactions   Cephalexin     Throat swelling    Past Medical History:  Diagnosis Date   Abnormal MRI    Colon polyp    Hot flashes    Hypercholesteremia    Hypothyroid    Memory loss    Osteoarthritis     There were no vitals taken for this visit.   Hyperlipidemia Patient with familial hyperlipidemia, baseline LDL at 201.  Has been intolerant to multiple statin drugs.  Reviewed options for lowering LDL cholesterol, including ezetimibe, PCSK-9 inhibitors, bempedoic acid and inclisiran.  Discussed mechanisms of action, dosing, side effects and potential decreases in LDL cholesterol.  Also reviewed cost information and potential options for patient assistance.  Answered all patient questions.  Based on this information, patient would prefer to start Repatha 140 mg.  Reviewed injection technique and patient gave first dose in office today.  Will submit to insurance for PA and repeat labs after 4-6 doses.   Phillips Hay PharmD CPP [facility] [address] [place], [place] [address] [phone]

## 2021-10-28 NOTE — Patient Instructions (Addendum)
Your Results:             Your most recent labs Goal  Total Cholesterol 278 < 200  Triglycerides 161 < 150  HDL (happy/good cholesterol) 44 > 40  LDL (lousy/bad cholesterol 201 < 70   Medication changes:   We will start the process to get Repatha covered by your insurance.  Once the prior authorization is complete, [facility] will call you to let you know and confirm pharmacy information.   You will take one injection every 14 days  Lab orders:  We want to repeat labs after 2-3 months.  We will send you a lab order to remind you once we get closer to that time.     Thank you for choosing [facility]

## 2021-10-28 NOTE — Assessment & Plan Note (Signed)
Patient with familial hyperlipidemia, baseline LDL at 201.  Has been intolerant to multiple statin drugs.  Reviewed options for lowering LDL cholesterol, including ezetimibe, PCSK-9 inhibitors, bempedoic acid and inclisiran.  Discussed mechanisms of action, dosing, side effects and potential decreases in LDL cholesterol.  Also reviewed cost information and potential options for patient assistance.  Answered all patient questions.  Based on this information, patient would prefer to start Repatha 140 mg.  Reviewed injection technique and patient gave first dose in office today.  Will submit to insurance for PA and repeat labs after 4-6 doses.

## 2021-10-29 ENCOUNTER — Ambulatory Visit
Admission: RE | Admit: 2021-10-29 | Discharge: 2021-10-29 | Disposition: A | Discharge status: Home / Self Care | Payer: 59 | Source: Ambulatory Visit | Attending: Nurse Practitioner | Admitting: Nurse Practitioner

## 2021-10-29 DIAGNOSIS — M79671 Pain in right foot: Secondary | ICD-10-CM

## 2021-11-01 ENCOUNTER — Telehealth: Payer: Self-pay | Admitting: Pharmacist Clinician (PhC)/ Clinical Pharmacy Specialist

## 2021-11-01 DIAGNOSIS — E785 Hyperlipidemia, unspecified: Secondary | ICD-10-CM

## 2021-11-01 MED ORDER — REPATHA SURECLICK 140 MG/ML ~~LOC~~ SOAJ: 140.0000 mg | SUBCUTANEOUS | 12 refills | Status: DC

## 2021-11-01 NOTE — Telephone Encounter (Signed)
Repatha approved  CMM:  [id number] Approved to 10/29/2022

## 2021-11-19 ENCOUNTER — Other Ambulatory Visit: Payer: Self-pay | Admitting: Nurse Practitioner

## 2021-11-19 DIAGNOSIS — Z1231 Encounter for screening mammogram for malignant neoplasm of breast: Secondary | ICD-10-CM

## 2021-12-01 ENCOUNTER — Other Ambulatory Visit: Payer: Self-pay

## 2021-12-01 DIAGNOSIS — E039 Hypothyroidism, unspecified: Secondary | ICD-10-CM

## 2021-12-01 MED ORDER — LEVOTHYROXINE SODIUM 150 MCG PO TABS: ORAL_TABLET | ORAL | 3 refills | Status: DC

## 2021-12-02 ENCOUNTER — Telehealth: Payer: Self-pay | Admitting: Internal Medicine

## 2021-12-02 NOTE — Telephone Encounter (Signed)
Patient returning call for echo results. 

## 2021-12-02 NOTE — Telephone Encounter (Signed)
Patient informed of echo results.Vicki KitchenMarland Wright"No worrisome findings on echo. Hyperdynamic ventricle, would encourage her to hydrate well. With mild LVH, would consider starting amlodipine 5 mg daily and follow up in 1 mo."  Patient did not want to stat amlodipine at this time. She said her Bps are normal. Her blood pressures have been 124/62, 130/66, 118/64 at office visits.  Appointment made for Oct. 10, 23 for 1 month f/u

## 2021-12-21 ENCOUNTER — Ambulatory Visit
Admission: RE | Admit: 2021-12-21 | Discharge: 2021-12-21 | Disposition: A | Discharge status: Home / Self Care | Payer: 59 | Source: Ambulatory Visit | Attending: Nurse Practitioner | Admitting: Nurse Practitioner

## 2021-12-21 DIAGNOSIS — Z1231 Encounter for screening mammogram for malignant neoplasm of breast: Secondary | ICD-10-CM

## 2022-01-10 ENCOUNTER — Other Ambulatory Visit: Payer: Self-pay | Admitting: Pharmacist Clinician (PhC)/ Clinical Pharmacy Specialist

## 2022-01-10 ENCOUNTER — Encounter: Payer: Self-pay | Admitting: Pharmacist Clinician (PhC)/ Clinical Pharmacy Specialist

## 2022-01-10 DIAGNOSIS — E785 Hyperlipidemia, unspecified: Secondary | ICD-10-CM

## 2022-01-18 ENCOUNTER — Ambulatory Visit: Payer: 59 | Admitting: Internal Medicine

## 2022-04-28 NOTE — Progress Notes (Signed)
Complete Physical  Assessment and Plan:  Encounter for general adult medical examination with abnormal findings 1 year  Aortic Atherosclerosis on CT 04/24/21 Control cholesterol, weight and blood pressure  Hypothyroidism, unspecified hypothyroidism type Hypothyroidism-check TSH level, continue medications the same, reminded to take on an empty stomach 30-28mins before food.  Currently taking levothyroxine 150 mcg everyday except Fri, Sat, Sun - TSH  Hyperlipidemia - Had myalgias with atorvastatin so was started on Repatha - does not want to take tha medication.  Will try Rosuvastatin 20 mg qd and have pt follow up with cardiology.  - CBC with Differential/Platelet - CMP/GFR - Lipid panel  LVH Start on Amlodipine 5 mg per Dr Jacques Navy suggestion Follow up with cardiology  Obesity Long discussion about weight loss, diet, and exercise Recommended diet heavy in fruits and veggies and low in animal meats, cheeses, and dairy products, appropriate calorie intake Patient will work on increasing activity, limiting processed carbohydrate and saturated fats Follow up at next visit   Abnormal Glucose Continue weight loss Discussed general issues about diabetes pathophysiology and management.  Agricultural engineer distributed.  Encouraged aerobic exercise. - Hemoglobin A1c  Osteopenia Will get at age 2  Osteoarthritis, unspecified osteoarthritis type, unspecified site controlled   Colon polyp Next due 2025   Medication management - Magnesium  Vitamin D deficiency - VITAMIN D 25 Hydroxy (Vit-D Deficiency, Fractures)   Screening for blood or protein in urine - Urinalysis, Routine w reflex microscopic (not at [facility]) - Microalbumin / creatinine urine ratio  Screening for AAA - Abd U/S retroperitoneal LTD  Abnormal MRI of head Continue follow up neuro; stable 5 year follow up in 2019    Discussed med's effects and SE's. Screening labs and tests as requested with regular  follow-up as recommended. Over 40 minutes of exam, counseling, chart review, and complex, high level critical decision making was performed this visit.   No future appointments.    HPI  62 y.o. AA female  presents for a complete physical. She has Hyperlipidemia; Abnormal MRI of head; Osteoarthritis; History of colon polyps; Abnormal glucose; Obesity (BMI 30.0-34.9); Osteopenia; Hypothyroidism; Essential hypertension; Mild memory disturbance; Lymphocytosis; and Atypical lymphocytes present on peripheral blood smear on their problem list.  She is divorced, single and happy. 2 daughters, 3 grandchilren close by. She is retired [employer] center.  She was seen in the ER 04/24/21 for left flank pain and had CT scan which showed no evidence of renal stone, no hydronephrosis concerning for obstruction. Incidental finding of aortic atherosclerosis  No GYN, s/p TAH in 2002. Gets mmg at breast center and is up to date    She has seen Dr. Terrace Arabia for memory disturbance, negative work up June 2017, normal B12, normal ferritin, negative HIV, RPR, she had a positive ANA but negative antiDNA. 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  No enhancement is noted.  There mild changes of chronic microvascular ischemia and chronic paranasal sinusitis.  Overall no significant change compared with MRI dated 02/26/2013.  Saw Dr Jacques Navy on 09/07/21 and had echo- results were No worrisome findings on echo. Hyperdynamic ventricle, would encourage her to hydrate well. With mild LVH, would consider starting amlodipine 5 mg daily and follow up in 1 mo. Pt did not start the medication or follow up with oncology  BMI is Body mass index is 32.2 kg/m., she has been working on diet and exercise, She is back at work for 02/09/22 until April.   She is walking more steps.  Wt Readings from Last 3 Encounters:  05/02/22 181 lb 12.8 oz (82.5 kg)  10/27/21 181 lb (82.1 kg)  08/27/21 184 lb 3.2 oz (83.6 kg)   Her blood pressure has been controlled at home, today their BP is BP: 128/68 BP Readings from Last 3 Encounters:  05/02/22 128/68  10/27/21 118/64  08/27/21 130/66   She does workout,    She is on cholesterol medication, lipitor 40mg  caused myalgias  She was started on repatha but has not used that medication for some time.  Her cholesterol is at goal. The cholesterol last visit was:   Lab Results  Component Value Date   CHOL 278 (H) 10/27/2021   HDL 44 (L) 10/27/2021   LDLCALC 201 (H) 10/27/2021   TRIG 161 (H) 10/27/2021   CHOLHDL 6.3 (H) 10/27/2021   She has been working on diet and exercise for abnormal glucose, and denies paresthesia of the feet, polydipsia, polyuria and visual disturbances.  Last A1C in the office was:  Lab Results  Component Value Date   HGBA1C 6.1 (H) 10/27/2021   Trying to drink more water Last GFR: Lab Results  Component Value Date   EGFR 54 (L) 10/27/2021    Patient is on Vitamin D supplement, she is on 5000 units. Lab Results  Component Value Date   VD25OH 51 04/29/2021     She is on thyroid medication. Her medication was not changed last visit, she is on 1 pill of Levothyroxine 150 mcg daily Monday- Thursday, none on F,S,S. She is on a biotin supplement.  Lab Results  Component Value Date   TSH 0.54 10/27/2021         Current Medications:   Current Outpatient Medications (Endocrine & Metabolic):    levothyroxine (SYNTHROID) 150 MCG tablet, TAKE 1 TABLET DAILY ON AN EMPTY STOMACH WITH ONLY WATER FOR 30 MINUTES & NO ANTACID MEDS, CALCIUM OR MAGNESIUM FOR 4 HOURS & AVOID BIOTIN  Current Outpatient Medications (Cardiovascular):    atorvastatin (LIPITOR) 40 MG tablet, Take 1 tablet Daily for Cholesterol   Evolocumab (REPATHA SURECLICK) 326 MG/ML SOAJ, Inject 140 mg into the skin  every 14 (fourteen) days. (Patient not taking: Reported on 05/02/2022)    Current Outpatient Medications (Hematological):    vitamin B-12 (CYANOCOBALAMIN) 100 MCG tablet, Take 100 mcg by mouth daily.  Current Outpatient Medications (Other):    BIOTIN PO, Take by mouth.   Cholecalciferol (VITAMIN D3) 5000 UNITS TABS, Take 5,000 Units by mouth daily.   Multiple Vitamins-Minerals (HAIR/SKIN/NAILS PO), Take by mouth daily.  Health Maintenance:   Immunization History  Administered Date(s) Administered   PFIZER(Purple Top)SARS-COV-2 Vaccination 08/31/2019, 09/23/2019   PPD Test 03/13/2014   Tdap 04/11/2006, 04/10/2017   TDAP: 2018 Pneumovax: N/A Prevnar 13: N/A Flu vaccine: declines Shingles: ask insurance   LMP: Hysterectomy Pap: 2016 PAP with negative HPV - Done due to hysterectomy  MGM: 12/21/21 DEXA: 02/2020 osteopenia- T-1.3 Colonoscopy: 06/2018 5 year recall, family history , due 2025 EGD: N/A  MRI brain 2019- abnormal but unchanged  Last eye: Dr. Trevor Iha, Dr. Katy Fitch, last 2021, goes annually Last dental: Dr. Freda Munro, last 2019, overdue   Patient Care Team: Unk Pinto, MD as PCP - General (Internal Medicine) Elouise Munroe, MD as PCP - Cardiology (Cardiology) Juanita Craver, MD as Consulting Physician (Gastroenterology)  Melrose Nakayama, MD as Consulting Physician (Orthopedic Surgery) Unk Pinto, MD as Referring Physician (Internal Medicine)  Medical History:  Past Medical History:  Diagnosis Date   Abnormal MRI    Colon polyp    Hot flashes    Hypercholesteremia    Hypothyroid    Memory loss    Osteoarthritis    Allergies Allergies  Allergen Reactions   Cephalexin     Throat swelling    SURGICAL HISTORY She  has a past surgical history that includes Cesarean section (80 & 82) and Vaginal hysterectomy (2002). FAMILY HISTORY Her family history includes Breast cancer in her maternal aunt, maternal aunt, and paternal aunt; Cancer in her mother,  paternal aunt, and paternal aunt; Dementia in her father; Diabetes in her father and mother; Heart disease in her father and mother; Hypertension in her father and mother; Uterine cancer in her paternal aunt. SOCIAL HISTORY She  reports that she has never smoked. She has never used smokeless tobacco. She reports that she does not drink alcohol and does not use drugs.  Review of Systems: Review of Systems  Constitutional:  Negative for chills, diaphoresis, fever and malaise/fatigue.  HENT: Negative.  Negative for congestion, hearing loss, sinus pain, sore throat and tinnitus.   Eyes: Negative.  Negative for blurred vision and double vision.  Respiratory:  Positive for shortness of breath (on exertion). Negative for cough, hemoptysis, sputum production and wheezing.   Cardiovascular:  Negative for chest pain, palpitations, orthopnea, claudication, leg swelling and PND.  Gastrointestinal: Negative.  Negative for abdominal pain, constipation, diarrhea, heartburn, nausea and vomiting.  Genitourinary:  Negative for dysuria, flank pain and urgency.  Musculoskeletal:  Negative for back pain, falls, joint pain, myalgias and neck pain.  Skin: Negative.  Negative for rash.  Neurological: Negative.  Negative for dizziness, tingling, tremors, weakness and headaches.  Endo/Heme/Allergies: Negative.  Does not bruise/bleed easily.  Psychiatric/Behavioral: Negative.  Negative for depression and suicidal ideas. The patient is not nervous/anxious and does not have insomnia.     Physical Exam: Estimated body mass index is 32.2 kg/m as calculated from the following:   Height as of this encounter: 5\' 3"  (1.6 m).   Weight as of this encounter: 181 lb 12.8 oz (82.5 kg). BP 128/68   Pulse 74   Temp 97.9 F (36.6 C)   Ht 5\' 3"  (1.6 m)   Wt 181 lb 12.8 oz (82.5 kg)   SpO2 97%   BMI 32.20 kg/m  General Appearance: Well nourished, in no apparent distress.  Eyes: PERRLA, EOMs, conjunctiva no swelling or  erythema Sinuses: No Frontal/maxillary tenderness  ENT/Mouth: Ext aud canals clear, normal light reflex with TMs without erythema, bulging. Good dentition. No erythema, swelling, or exudate on post pharynx. Tonsils not swollen or erythematous. Hearing normal.  Neck: Supple, thyroid normal. No bruits  Respiratory: Respiratory effort normal, BS equal bilaterally without rales, rhonchi, wheezing or stridor.  Cardio: RRR without murmurs, rubs or gallops. Brisk peripheral pulses without edema.  Chest: symmetric, with normal excursions and percussion.  Breasts: declines today  Abdomen: Soft, nontender, no guarding, rebound, hernias, masses, or organomegaly.  Lymphatics: Non tender without lymphadenopathy.  Genitourinary: defer Musculoskeletal: Full ROM all peripheral extremities,5/5 strength, and normal gait, negative straight leg.  Non tender through anterior chest wall Skin: Warm, dry without rashes, lesions, ecchymosis. Neuro: Cranial nerves intact, reflexes equal bilaterally. Normal muscle tone, no cerebellar symptoms. Sensation intact.  Psych: Awake and oriented X 3, normal affect, Insight and Judgment appropriate.  EKG: Defer to cardiology AAA: < 3 cm  Needham Biggins E  2:23 PM [facility]

## 2022-05-02 ENCOUNTER — Ambulatory Visit: Payer: 59 | Admitting: Nurse Practitioner

## 2022-05-02 ENCOUNTER — Encounter: Payer: Self-pay | Admitting: Nurse Practitioner

## 2022-05-02 VITALS — BP 128/68 | HR 74 | Temp 97.9°F | Ht 63.0 in | Wt 181.8 lb

## 2022-05-02 DIAGNOSIS — M8580 Other specified disorders of bone density and structure, unspecified site: Secondary | ICD-10-CM

## 2022-05-02 DIAGNOSIS — Z0001 Encounter for general adult medical examination with abnormal findings: Secondary | ICD-10-CM

## 2022-05-02 DIAGNOSIS — E669 Obesity, unspecified: Secondary | ICD-10-CM

## 2022-05-02 DIAGNOSIS — I700 Atherosclerosis of aorta: Secondary | ICD-10-CM

## 2022-05-02 DIAGNOSIS — N183 Chronic kidney disease, stage 3 unspecified: Secondary | ICD-10-CM

## 2022-05-02 DIAGNOSIS — Z1389 Encounter for screening for other disorder: Secondary | ICD-10-CM

## 2022-05-02 DIAGNOSIS — Z79899 Other long term (current) drug therapy: Secondary | ICD-10-CM

## 2022-05-02 DIAGNOSIS — Z0000 Encounter for general adult medical examination without abnormal findings: Secondary | ICD-10-CM | POA: Diagnosis not present

## 2022-05-02 DIAGNOSIS — M1990 Unspecified osteoarthritis, unspecified site: Secondary | ICD-10-CM

## 2022-05-02 DIAGNOSIS — I10 Essential (primary) hypertension: Secondary | ICD-10-CM

## 2022-05-02 DIAGNOSIS — I517 Cardiomegaly: Secondary | ICD-10-CM

## 2022-05-02 DIAGNOSIS — E039 Hypothyroidism, unspecified: Secondary | ICD-10-CM

## 2022-05-02 DIAGNOSIS — E785 Hyperlipidemia, unspecified: Secondary | ICD-10-CM

## 2022-05-02 DIAGNOSIS — Z136 Encounter for screening for cardiovascular disorders: Secondary | ICD-10-CM

## 2022-05-02 DIAGNOSIS — R7309 Other abnormal glucose: Secondary | ICD-10-CM

## 2022-05-02 DIAGNOSIS — E559 Vitamin D deficiency, unspecified: Secondary | ICD-10-CM

## 2022-05-02 MED ORDER — AMLODIPINE BESYLATE 5 MG PO TABS: 5.0000 mg | ORAL_TABLET | Freq: Every day | ORAL | 11 refills | Status: DC

## 2022-05-02 MED ORDER — ROSUVASTATIN CALCIUM 20 MG PO TABS: 20.0000 mg | ORAL_TABLET | Freq: Every day | ORAL | 11 refills | Status: DC

## 2022-05-02 NOTE — Patient Instructions (Signed)
Start Amlodipine 5 mg daily per Dr. Margaretann Loveless suggestion and schedule a follow up  appointment in the next 3 months  Start Rosuvastatin daily, take at bedtime High Cholesterol  High cholesterol is a condition in which the blood has high levels of a white, waxy substance similar to fat (cholesterol). The liver makes all the cholesterol that the body needs. The human body needs small amounts of cholesterol to help build cells. A person gets extra or excess cholesterol from the food that he or she eats. The blood carries cholesterol from the liver to the rest of the body. If you have high cholesterol, deposits (plaques) may build up on the walls of your arteries. Arteries are the blood vessels that carry blood away from your heart. These plaques make the arteries narrow and stiff. Cholesterol plaques increase your risk for heart attack and stroke. Work with your health care provider to keep your cholesterol levels in a healthy range. What increases the risk? The following factors may make you more likely to develop this condition: Eating foods that are high in animal fat (saturated fat) or cholesterol. Being overweight. Not getting enough exercise. A family history of high cholesterol (familial hypercholesterolemia). Use of tobacco products. Having diabetes. What are the signs or symptoms? In most cases, high cholesterol does not usually cause any symptoms. In severe cases, very high cholesterol levels can cause: Fatty bumps under the skin (xanthomas). A white or gray ring around the black center (pupil) of the eye. How is this diagnosed? This condition may be diagnosed based on the results of a blood test. If you are older than 64 years of age, your health care provider may check your cholesterol levels every 4-6 years. You may be checked more often if you have high cholesterol or other risk factors for heart disease. The blood test for cholesterol measures: "Bad" cholesterol, or LDL  cholesterol. This is the main type of cholesterol that causes heart disease. The desired level is less than 100 mg/dL (2.59 mmol/L). "Good" cholesterol, or HDL cholesterol. HDL helps protect against heart disease by cleaning the arteries and carrying the LDL to the liver for processing. The desired level for HDL is 60 mg/dL (1.55 mmol/L) or higher. Triglycerides. These are fats that your body can store or burn for energy. The desired level is less than 150 mg/dL (1.69 mmol/L). Total cholesterol. This measures the total amount of cholesterol in your blood and includes LDL, HDL, and triglycerides. The desired level is less than 200 mg/dL (5.17 mmol/L). How is this treated? Treatment for high cholesterol starts with lifestyle changes, such as diet and exercise. Diet changes. You may be asked to eat foods that have more fiber and less saturated fats or added sugar. Lifestyle changes. These may include regular exercise, maintaining a healthy weight, and quitting use of tobacco products. Medicines. These are given when diet and lifestyle changes have not worked. You may be prescribed a statin medicine to help lower your cholesterol levels. Follow these instructions at home: Eating and drinking  Eat a healthy, balanced diet. This diet includes: Daily servings of a variety of fresh, frozen, or canned fruits and vegetables. Daily servings of whole grain foods that are rich in fiber. Foods that are low in saturated fats and trans fats. These include poultry and fish without skin, lean cuts of meat, and low-fat dairy products. A variety of fish, especially oily fish that contain omega-3 fatty acids. Aim to eat fish at least 2 times a week. Avoid  foods and drinks that have added sugar. Use healthy cooking methods, such as roasting, grilling, broiling, baking, poaching, steaming, and stir-frying. Do not fry your food except for stir-frying. If you drink alcohol: Limit how much you have to: 0-1 drink a day  for women who are not pregnant. 0-2 drinks a day for men. Know how much alcohol is in a drink. In the [place] one drink equals one 12 oz bottle of beer (355 mL), one 5 oz glass of wine (148 mL), or one 1 oz glass of hard liquor (44 mL). Lifestyle  Get regular exercise. Aim to exercise for a total of 150 minutes a week. Increase your activity level by doing activities such as gardening, walking, and taking the stairs. Do not use any products that contain nicotine or tobacco. These products include cigarettes, chewing tobacco, and vaping devices, such as e-cigarettes. If you need help quitting, ask your health care provider. General instructions Take over-the-counter and prescription medicines only as told by your health care provider. Keep all follow-up visits. This is important. Where to find more information American Heart Association: www.heart.org National Heart, Lung, and Blood Institute: https://wilson-eaton.com/ Contact a health care provider if: You have trouble achieving or maintaining a healthy diet or weight. You are starting an exercise program. You are unable to stop smoking. Get help right away if: You have chest pain. You have trouble breathing. You have discomfort or pain in your jaw, neck, back, shoulder, or arm. You have any symptoms of a stroke. "BE FAST" is an easy way to remember the main warning signs of a stroke: B - Balance. Signs are dizziness, sudden trouble walking, or loss of balance. E - Eyes. Signs are trouble seeing or a sudden change in vision. F - Face. Signs are sudden weakness or numbness of the face, or the face or eyelid drooping on one side. A - Arms. Signs are weakness or numbness in an arm. This happens suddenly and usually on one side of the body. S - Speech. Signs are sudden trouble speaking, slurred speech, or trouble understanding what people say. T - Time. Time to call emergency services. Write down what time symptoms started. You have other signs of a  stroke, such as: A sudden, severe headache with no known cause. Nausea or vomiting. Seizure. These symptoms may represent a serious problem that is an emergency. Do not wait to see if the symptoms will go away. Get medical help right away. Call your local emergency services (911 in the [place] Do not drive yourself to the hospital. Summary Cholesterol plaques increase your risk for heart attack and stroke. Work with your health care provider to keep your cholesterol levels in a healthy range. Eat a healthy, balanced diet, get regular exercise, and maintain a healthy weight. Do not use any products that contain nicotine or tobacco. These products include cigarettes, chewing tobacco, and vaping devices, such as e-cigarettes. Get help right away if you have any symptoms of a stroke. This information is not intended to replace advice given to you by your health care provider. Make sure you discuss any questions you have with your health care provider. Document Revised: 10/29/2021 Document Reviewed: 06/01/2020 Elsevier Patient Education  [place].

## 2022-05-03 ENCOUNTER — Other Ambulatory Visit: Payer: Self-pay | Admitting: Nurse Practitioner

## 2022-05-04 LAB — COMPLETE METABOLIC PANEL WITH GFR
AG Ratio: 1.5 (calc) (ref 1.0–2.5)
ALT: 42 U/L — ABNORMAL HIGH (ref 6–29)
AST: 27 U/L (ref 10–35)
Albumin: 4.7 g/dL (ref 3.6–5.1)
Alkaline phosphatase (APISO): 59 U/L (ref 37–153)
BUN/Creatinine Ratio: 9 (calc) (ref 6–22)
BUN: 11 mg/dL (ref 7–25)
CO2: 32 mmol/L (ref 20–32)
Calcium: 10 mg/dL (ref 8.6–10.4)
Chloride: 103 mmol/L (ref 98–110)
Creat: 1.21 mg/dL — ABNORMAL HIGH (ref 0.50–1.05)
Globulin: 3.1 g/dL (calc) (ref 1.9–3.7)
Glucose, Bld: 85 mg/dL (ref 65–99)
Potassium: 4.3 mmol/L (ref 3.5–5.3)
Sodium: 141 mmol/L (ref 135–146)
Total Bilirubin: 0.7 mg/dL (ref 0.2–1.2)
Total Protein: 7.8 g/dL (ref 6.1–8.1)
eGFR: 50 mL/min/{1.73_m2} — ABNORMAL LOW (ref >=60)

## 2022-05-04 LAB — URINALYSIS W MICROSCOPIC + REFLEX CULTURE
Bacteria, UA: NONE SEEN /HPF
Bilirubin Urine: NEGATIVE
Glucose, UA: NEGATIVE
Hgb urine dipstick: NEGATIVE
Hyaline Cast: NONE SEEN /LPF
Ketones, ur: NEGATIVE
Nitrites, Initial: NEGATIVE
Protein, ur: NEGATIVE
RBC / HPF: NONE SEEN /HPF (ref 0–2)
Specific Gravity, Urine: 1.016 (ref 1.001–1.035)
Squamous Epithelial / HPF: NONE SEEN /HPF (ref <=5)
pH: 5.5 (ref 5.0–8.0)

## 2022-05-04 LAB — URINE CULTURE
MICRO NUMBER:: 14457042
Result:: NO GROWTH
SPECIMEN QUALITY:: ADEQUATE

## 2022-05-04 LAB — CBC WITH DIFFERENTIAL/PLATELET
Absolute Monocytes: 602 cells/uL (ref 200–950)
Basophils Absolute: 52 cells/uL (ref 0–200)
Basophils Relative: 0.6 %
Eosinophils Absolute: 77 cells/uL (ref 15–500)
Eosinophils Relative: 0.9 %
HCT: 37.9 % (ref 35.0–45.0)
Hemoglobin: 12.9 g/dL (ref 11.7–15.5)
Lymphs Abs: 4076 cells/uL — ABNORMAL HIGH (ref 850–3900)
MCH: 29.9 pg (ref 27.0–33.0)
MCHC: 34 g/dL (ref 32.0–36.0)
MCV: 87.9 fL (ref 80.0–100.0)
MPV: 10.9 fL (ref 7.5–12.5)
Monocytes Relative: 7 %
Neutro Abs: 3793 cells/uL (ref 1500–7800)
Neutrophils Relative %: 44.1 %
Platelets: 281 10*3/uL (ref 140–400)
RBC: 4.31 10*6/uL (ref 3.80–5.10)
RDW: 12.9 % (ref 11.0–15.0)
Total Lymphocyte: 47.4 %
WBC: 8.6 10*3/uL (ref 3.8–10.8)

## 2022-05-04 LAB — MICROALBUMIN / CREATININE URINE RATIO
Creatinine, Urine: 137 mg/dL (ref 20–275)
Microalb Creat Ratio: 8 mcg/mg creat (ref <30)
Microalb, Ur: 1.1 mg/dL

## 2022-05-04 LAB — VITAMIN D 25 HYDROXY (VIT D DEFICIENCY, FRACTURES): Vit D, 25-Hydroxy: 51 ng/mL (ref 30–100)

## 2022-05-04 LAB — TSH: TSH: 5.02 mIU/L — ABNORMAL HIGH (ref 0.40–4.50)

## 2022-05-04 LAB — HEMOGLOBIN A1C
Hgb A1c MFr Bld: 6.3 % of total Hgb — ABNORMAL HIGH (ref <5.7)
Mean Plasma Glucose: 134 mg/dL
eAG (mmol/L): 7.4 mmol/L

## 2022-05-04 LAB — LIPID PANEL
Cholesterol: 172 mg/dL (ref <200)
HDL: 56 mg/dL (ref >=50)
LDL Cholesterol (Calc): 97 mg/dL (calc)
Non-HDL Cholesterol (Calc): 116 mg/dL (calc) (ref <130)
Total CHOL/HDL Ratio: 3.1 (calc) (ref <5.0)
Triglycerides: 99 mg/dL (ref <150)

## 2022-05-04 LAB — MAGNESIUM: Magnesium: 2.2 mg/dL (ref 1.5–2.5)

## 2022-05-04 LAB — CULTURE INDICATED

## 2022-05-17 ENCOUNTER — Encounter: Payer: Self-pay | Admitting: Nurse Practitioner

## 2022-05-18 ENCOUNTER — Other Ambulatory Visit: Payer: Self-pay | Admitting: Nurse Practitioner

## 2022-05-18 DIAGNOSIS — E785 Hyperlipidemia, unspecified: Secondary | ICD-10-CM

## 2022-05-18 MED ORDER — EZETIMIBE 10 MG PO TABS: 10.0000 mg | ORAL_TABLET | Freq: Every day | ORAL | 11 refills | Status: AC

## 2022-08-05 ENCOUNTER — Encounter: Payer: Self-pay | Admitting: Internal Medicine

## 2022-08-05 ENCOUNTER — Ambulatory Visit: Payer: 59 | Attending: Internal Medicine | Admitting: Internal Medicine

## 2022-08-05 VITALS — BP 128/78 | HR 70 | Ht 63.0 in | Wt 182.4 lb

## 2022-08-05 DIAGNOSIS — R9431 Abnormal electrocardiogram [ECG] [EKG]: Secondary | ICD-10-CM | POA: Diagnosis not present

## 2022-08-05 DIAGNOSIS — I10 Essential (primary) hypertension: Secondary | ICD-10-CM

## 2022-08-05 DIAGNOSIS — R079 Chest pain, unspecified: Secondary | ICD-10-CM

## 2022-08-05 DIAGNOSIS — E7801 Familial hypercholesterolemia: Secondary | ICD-10-CM

## 2022-08-05 DIAGNOSIS — E785 Hyperlipidemia, unspecified: Secondary | ICD-10-CM | POA: Diagnosis not present

## 2022-08-05 NOTE — Progress Notes (Signed)
Cardiology Office Note:    Date:  08/05/2022  ID:  Vicki Wright, DOB 02-20-1961, MRN [id number]  PCP:  Lucky Cowboy, MD  Cardiologist:  Parke Poisson, MD  Electrophysiologist:  None   Referring MD: Lucky Cowboy, MD   Chief Complaint/Reason for Referral: Follow-up chest pain  History of Present Illness:    Vicki Wright is a 62 y.o. female with a history of hyperlipidemia and prediabetes presents for follow-up of chest pain.  Coronary CTA 06/02/20 showed minimal CAD and coronary calcium score of 8.    At her last visit 08/2021 she complained of substernal chest pain while lying down to sleep. Not significant while active. Occurring for approximately 2 months, she wrote to her PCP with chest pain in February suggesting longer time frame. We reviewed that with reassuring CCTA this is unlikely ischemic. We discussed statin holiday for possible manifestation of myalgias, as well as echo to evaluate for other structural causes of chest pain. She was amenable and 2 week statin holiday was initiated. After 1 week she reported improvement of her nocturnal chest pain and she did not wish to resume atorvastatin. She was referred to lipid clinic and seen by our pharmacist 10/28/2021. Baseline LDL 201. After shared decision making she was started on Repatha 140 mg; initial dose given in office.  She had an echocardiogram 08/2021 with no worrisome findings. Hyperdynamic ventricle, would encourage her to hydrate well. With mild LVH, would consider starting amlodipine 5 mg daily and follow up in 1 mo. Patient did not want to stat amlodipine. She reported normal blood pressures, with readings of 124/62, 130/66, 118/64 at office visits.  As of 01/2022 she reported intolerance of Repatha after three doses. It did not agree with her, so she resumed the pills that she was taking previously.  Today, she states she is feeling pretty good. She denies any recurring chest pains. Her blood pressure is well  controlled in clinic on 5 mg amlodipine.   Sometimes she is aware of some swelling in her ankles, but never to a concerning degree.  She confirms that she is not taking the rosuvastatin at this time. She is taking 10 mg Zetia. We reviewed her most recent labs showing triglycerides 99, LDL 97, HDL 56.  She endorses prediabetes followed by her PCP Anda Kraft, NP. Currently she is focusing on her diet and exercise. We discussed diet recommendations from a cardiovascular perspective.  She denies any palpitations, shortness of breath, or peripheral edema. No lightheadedness, headaches, syncope, orthopnea, or PND.   Past Medical History:  Diagnosis Date   Abnormal MRI    Colon polyp    Hot flashes    Hypercholesteremia    Hypothyroid    Memory loss    Osteoarthritis     Past Surgical History:  Procedure Laterality Date   CESAREAN SECTION  80 & 69   VAGINAL HYSTERECTOMY  2002   TAH    Current Medications: Current Meds  Medication Sig   amLODipine (NORVASC) 5 MG tablet Take 1 tablet (5 mg total) by mouth daily.   BIOTIN PO Take by mouth.   Cholecalciferol (VITAMIN D3) 5000 UNITS TABS Take 5,000 Units by mouth daily.   ezetimibe (ZETIA) 10 MG tablet Take 1 tablet (10 mg total) by mouth daily.   levothyroxine (SYNTHROID) 150 MCG tablet TAKE 1 TABLET DAILY ON AN EMPTY STOMACH WITH ONLY WATER FOR 30 MINUTES & NO ANTACID MEDS, CALCIUM OR MAGNESIUM FOR 4 HOURS & AVOID BIOTIN  Multiple Vitamins-Minerals (HAIR/SKIN/NAILS PO) Take by mouth daily.   vitamin B-12 (CYANOCOBALAMIN) 100 MCG tablet Take 100 mcg by mouth daily.     Allergies:   Cephalexin   Social History   Tobacco Use   Smoking status: Never   Smokeless tobacco: Never  Vaping Use   Vaping Use: Never used  Substance Use Topics   Alcohol use: No   Drug use: No     Family History: The patient's family history includes Breast cancer in her maternal aunt, maternal aunt, and paternal aunt; Cancer in her mother,  paternal aunt, and paternal aunt; Dementia in her father; Diabetes in her father and mother; Heart disease in her father and mother; Hypertension in her father and mother; Uterine cancer in her paternal aunt.  ROS:   Please see the history of present illness.    All other systems reviewed and are negative.  EKGs/Labs/Other Studies Reviewed:    The following studies were reviewed today:  Echocardiogram  09/07/2021: 1. Mild intracavitary gradient. Peak velocity 1.07 m/s. Peak gradient 4.5  mmHg. Left ventricular ejection fraction, by estimation, is 65 to 70%. The  left ventricle has normal function. The left ventricle has no regional  wall motion abnormalities. There  is mild concentric left ventricular hypertrophy. Left ventricular  diastolic parameters are consistent with Grade I diastolic dysfunction  (impaired relaxation). The average left ventricular global longitudinal  strain is -27.2 %. The global longitudinal  strain is normal.   2. Right ventricular systolic function is normal. The right ventricular  size is normal.   3. The mitral valve is normal in structure. Trivial mitral valve  regurgitation. No evidence of mitral stenosis.   4. The aortic valve is tricuspid. Aortic valve regurgitation is not  visualized. No aortic stenosis is present.   5. The inferior vena cava is normal in size with greater than 50%  respiratory variability, suggesting right atrial pressure of 3 mmHg.   Coronary CT  06/02/2020: IMPRESSION: 1. Minimal CAD, CADRADS = 1. Significant motion artifact affects assessment of mid LAD and OM1. If recurrent symptoms consider alternate ischemic evaluation if clinically indicated.   2. Coronary calcium score is 8, which places the patient in the 75th percentile for age and sex matched control.   3. Normal coronary origin with right dominance.   EKG:  EKG is personally reviewed. 08/05/2022:  NSR, T wave abnormality inferior and lateral. 08/27/2021:  NSR,  nonspecific T wave abnormality  Recent Labs: 05/02/2022: ALT 42; BUN 11; Creat 1.21; Hemoglobin 12.9; Magnesium 2.2; Platelets 281; Potassium 4.3; Sodium 141; TSH 5.02   Recent Lipid Panel    Component Value Date/Time   CHOL 172 05/02/2022 1451   TRIG 99 05/02/2022 1451   HDL 56 05/02/2022 1451   CHOLHDL 3.1 05/02/2022 1451   VLDL 19 06/27/2016 1549   LDLCALC 97 05/02/2022 1451    Physical Exam:    VS:  BP 128/78   Pulse 70   Ht 5\' 3"  (1.6 m)   Wt 182 lb 6.4 oz (82.7 kg)   SpO2 95%   BMI 32.31 kg/m     Wt Readings from Last 5 Encounters:  08/05/22 182 lb 6.4 oz (82.7 kg)  05/02/22 181 lb 12.8 oz (82.5 kg)  10/27/21 181 lb (82.1 kg)  08/27/21 184 lb 3.2 oz (83.6 kg)  04/29/21 188 lb 3.2 oz (85.4 kg)    Constitutional: No acute distress Eyes: sclera non-icteric, normal conjunctiva and lids ENMT: normal dentition, moist mucous membranes Cardiovascular:  regular rhythm, normal rate, no murmurs. S1 and S2 normal. Radial pulses normal bilaterally. No jugular venous distention.  Respiratory: clear to auscultation bilaterally GI : normal bowel sounds, soft and nontender. No distention.   MSK: extremities warm, well perfused. No edema.  NEURO: grossly nonfocal exam, moves all extremities. PSYCH: alert and oriented x 3, normal mood and affect.   ASSESSMENT:    1. Hyperlipidemia, unspecified hyperlipidemia type   2. Familial hypercholesterolemia   3. Chest pain of uncertain etiology   4. Nonspecific abnormal electrocardiogram (ECG) (EKG)   5. Primary hypertension     PLAN:    Precordial pain Abnormal EKG -No recurrence of chest pain, coronary CTA low risk with minimal CAD.  Echocardiogram grossly unremarkable -Abnormal EKG persist but is unchanged from prior  Hypertension-continue amlodipine 5 mg daily  Hyperlipidemia, unspecified hyperlipidemia type -goal LDL is less than 70, patient did not tolerate statin or Repatha.  She is attempting diet and lifestyle  modification we discussed Mediterranean diet today.  Follow-up:  1 year.  Total time of encounter: 20 minutes total time of encounter, including 15 minutes spent in face-to-face patient care on the date of this encounter. This time includes coordination of care and counseling regarding above mentioned problem list. Remainder of non-face-to-face time involved reviewing chart documents/testing relevant to the patient encounter and documentation in the medical record. I have independently reviewed documentation from referring provider.   Weston Brass, MD, [facility]  [facility]   Medication Adjustments/Labs and Tests Ordered: Current medicines are reviewed at length with the patient today.  Concerns regarding medicines are outlined above.   Orders Placed This Encounter  Procedures   EKG 12-Lead   No orders of the defined types were placed in this encounter.  Patient Instructions  Medication Instructions:  No Changes In Medications at this time.  *If you need a refill on your cardiac medications before your next appointment, please call your pharmacy*  Follow-Up: At [facility], you and your health needs are our priority.  As part of our continuing mission to provide you with exceptional heart care, we have created designated Provider Care Teams.  These Care Teams include your primary Cardiologist (physician) and Advanced Practice Providers (APPs -  Physician Assistants and Nurse Practitioners) who all work together to provide you with the care you need, when you need it.  Your next appointment:   1 year(s)   Provider:    Parke Poisson, MD       [facility],acting as a scribe for Parke Poisson, MD.,have documented all relevant documentation on the behalf of Parke Poisson, MD,as directed by  Parke Poisson, MD while in the presence of Parke Poisson, MD.  I, Parke Poisson, MD, have reviewed all documentation for the visit on 08/05/2022.  The documentation on today's date of service for the exam, diagnosis, procedures, and orders are all accurate and complete.

## 2022-08-05 NOTE — Patient Instructions (Signed)
Medication Instructions:  No Changes In Medications at this time.  *If you need a refill on your cardiac medications before your next appointment, please call your pharmacy*  Follow-Up: At [facility], you and your health needs are our priority.  As part of our continuing mission to provide you with exceptional heart care, we have created designated Provider Care Teams.  These Care Teams include your primary Cardiologist (physician) and Advanced Practice Providers (APPs -  Physician Assistants and Nurse Practitioners) who all work together to provide you with the care you need, when you need it.  Your next appointment:   1 year(s)  Provider:   Gayatri A Acharya, MD    

## 2022-08-18 NOTE — Progress Notes (Signed)
6 MONTH FOLLOW UP  Assessment and Plan:  Hypothyroidism, unspecified hypothyroidism type Hypothyroidism-check TSH level, continue medications the same, reminded to take on an empty stomach 30-4mins before food.  - TSH  Essential Hypertension - continue medications- Amlodipine 5 mg , DASH diet, exercise and monitor at home. Call if greater than 130/80.  - CBC, CMP  Aortic Atherosclerosis(HCC) Continue to monitor blood pressure, blood sugars, cholesterol ans weight  Hyperlipidemia - She was taken off of Atorvastatin 40 mg by cardiologist, continues on Zetia 10 mg QD -  decrease fatty foods, increase activity.  - Lipid panel  Abnormal Glucose Continue weight loss Discussed general issues about diabetes pathophysiology and management.  Agricultural engineer distributed.  Encouraged aerobic exercise. -CMP   Medication management Continued  Vitamin D deficiency At goal at recent check; continue to recommend supplementation for goal of 60-100 Defer vitamin D level  Obesity Long discussion about weight loss, diet, and exercise Recommended diet heavy in fruits and veggies and low in animal meats, cheeses, and dairy products, appropriate calorie intake Patient will work on decreasing saturated fats and simple carbs.  Increase lean protein and exercise Follow up at next visit   Discussed med's effects and SE's. Screening labs and tests as requested with regular follow-up as recommended. Over 40 minutes of exam, counseling, chart review, and complex, high level critical decision making was performed this visit.   Future Appointments  Date Time Provider Department Center  05/05/2023 10:00 AM Raynelle Dick, NP [facility] None     HPI  62 y.o. AA female  presents for a 3 month follow up for htn, hyperlipidemia, prediabetes, obesity, hypothyroid, vitamin D def.    Pt states she has no issues today and is doing well.   BMI is Body mass index is 32.31 kg/m., she has been  working on diet and exercise, walking daily weather permitting. She has been eating more salads, doing grilled fish, beans.  Wt Readings from Last 3 Encounters:  08/19/22 182 lb 6.4 oz (82.7 kg)  08/05/22 182 lb 6.4 oz (82.7 kg)  05/02/22 181 lb 12.8 oz (82.5 kg)   Her blood pressure has been controlled at home with use of Amlodipine 5 mg QD, today their BP is BP: 124/68 BP Readings from Last 3 Encounters:  08/19/22 124/68  08/05/22 128/78  05/02/22 128/68  She does workout, was walking daily at work until got cold. Denies chest pain, dyspnea, dizziness.   Had low risk coronary CT calcium score of 8 on 06/02/2020 She was on Atorvastatin 40 mg but was d/c'd by cardiologist due to muscle pain .  Has appointment with pharmacist at their office to discuss medication.  The pain did resolve. Currently on Zetia 10 mg QD  Her cholesterol is not at goal. The cholesterol last visit was:   Lab Results  Component Value Date   CHOL 172 05/02/2022   HDL 56 05/02/2022   LDLCALC 97 05/02/2022   TRIG 99 05/02/2022   CHOLHDL 3.1 05/02/2022   She has been working on diet and exercise for prediabetes, and denies paresthesia of the feet, polydipsia, polyuria and visual disturbances. Last A1C in the office was:  Lab Results  Component Value Date   HGBA1C 6.3 (H) 05/02/2022   She is trying to drink more water. Last GFR: Lab Results  Component Value Date   EGFR 50 (L) 05/02/2022     Patient is on Vitamin D supplement, she is on 5000 units. Lab Results  Component Value Date  VD25OH 51 05/02/2022     She is on thyroid medication. Her medication was not changed last visit, she is on 1 pill Monday- Thursday, none on F,S,S. She is on a biotin supplement.  Lab Results  Component Value Date   TSH 5.02 (H) 05/02/2022      Current Medications:   Current Outpatient Medications (Endocrine & Metabolic):    levothyroxine (SYNTHROID) 150 MCG tablet, TAKE 1 TABLET DAILY ON AN EMPTY STOMACH WITH ONLY  WATER FOR 30 MINUTES & NO ANTACID MEDS, CALCIUM OR MAGNESIUM FOR 4 HOURS & AVOID BIOTIN  Current Outpatient Medications (Cardiovascular):    amLODipine (NORVASC) 5 MG tablet, Take 1 tablet (5 mg total) by mouth daily.   ezetimibe (ZETIA) 10 MG tablet, Take 1 tablet (10 mg total) by mouth daily.   Evolocumab (REPATHA SURECLICK) 140 MG/ML SOAJ, Inject 140 mg into the skin every 14 (fourteen) days. (Patient not taking: Reported on 05/02/2022)   rosuvastatin (CRESTOR) 20 MG tablet, Take 1 tablet (20 mg total) by mouth daily.    Current Outpatient Medications (Hematological):    vitamin B-12 (CYANOCOBALAMIN) 100 MCG tablet, Take 100 mcg by mouth daily.  Current Outpatient Medications (Other):    BIOTIN PO, Take by mouth.   Cholecalciferol (VITAMIN D3) 5000 UNITS TABS, Take 5,000 Units by mouth daily.   COLLAGEN PO, Take by mouth.   Flaxseed, Linseed, (FLAX SEED OIL PO), Take by mouth.   Multiple Vitamins-Minerals (HAIR/SKIN/NAILS PO), Take by mouth daily.  Medical History:  Past Medical History:  Diagnosis Date   Abnormal MRI    Colon polyp    Hot flashes    Hypercholesteremia    Hypothyroid    Memory loss    Osteoarthritis    Allergies Allergies  Allergen Reactions   Cephalexin     Throat swelling    SURGICAL HISTORY She  has a past surgical history that includes Cesarean section (80 & 82) and Vaginal hysterectomy (2002). FAMILY HISTORY Her family history includes Breast cancer in her maternal aunt, maternal aunt, and paternal aunt; Cancer in her mother, paternal aunt, and paternal aunt; Dementia in her father; Diabetes in her father and mother; Heart disease in her father and mother; Hypertension in her father and mother; Uterine cancer in her paternal aunt. SOCIAL HISTORY She  reports that she has never smoked. She has never used smokeless tobacco. She reports that she does not drink alcohol and does not use drugs.   Review of Systems: Review of Systems  Constitutional:   Negative for diaphoresis, malaise/fatigue and weight loss.  HENT: Negative.  Negative for hearing loss and tinnitus.   Eyes: Negative.  Negative for blurred vision and double vision.  Respiratory:  Negative for cough, hemoptysis, sputum production, shortness of breath and wheezing.   Cardiovascular:  Negative for chest pain, palpitations, orthopnea, claudication, leg swelling and PND.  Gastrointestinal: Negative.  Negative for abdominal pain, blood in stool, constipation, diarrhea, heartburn, melena, nausea and vomiting.  Genitourinary: Negative.   Musculoskeletal:  Positive for back pain (low back intermittent) and joint pain (hips intermittent). Negative for myalgias.  Skin: Negative.  Negative for rash.  Neurological: Negative.  Negative for dizziness, tingling, sensory change, weakness and headaches.  Endo/Heme/Allergies: Negative.  Negative for polydipsia.  Psychiatric/Behavioral: Negative.    All other systems reviewed and are negative.   Physical Exam: Estimated body mass index is 32.31 kg/m as calculated from the following:   Height as of this encounter: 5\' 3"  (1.6 m).   Weight as  of this encounter: 182 lb 6.4 oz (82.7 kg). BP 124/68   Pulse (!) 58   Temp (!) 97.5 F (36.4 C)   Ht 5\' 3"  (1.6 m)   Wt 182 lb 6.4 oz (82.7 kg)   SpO2 97%   BMI 32.31 kg/m  General Appearance: Well nourished, in no apparent distress.  Eyes: PERRLA, EOMs, conjunctiva no swelling or erythema Sinuses: No Frontal/maxillary tenderness  ENT/Mouth: Ext aud canals clear, normal light reflex with TMs without erythema, bulging. Good dentition. No erythema, swelling, or exudate on post pharynx. Tonsils not swollen or erythematous. Hearing normal.  Neck: Supple, thyroid normal. No bruits  Respiratory: Respiratory effort normal, BS equal bilaterally without rales, rhonchi, wheezing or stridor.  Cardio: RRR without murmurs, rubs or gallops. Brisk peripheral pulses without edema.  Chest: symmetric, with normal  excursions and percussion.  Abdomen: Soft, nontender, no guarding, rebound, hernias, masses, or organomegaly.  Lymphatics: Non tender without lymphadenopathy.  Musculoskeletal: Full ROM all peripheral extremities,5/5 strength, and normal gait. Unable to reproduce right heel pain on exam Skin: Warm, dry . Multiple moles on face  Neuro: Cranial nerves intact, reflexes equal bilaterally. Normal muscle tone, no cerebellar symptoms. Sensation intact.  Psych: Awake and oriented X 3, normal affect, Insight and Judgment appropriate.     Raynelle Dick 9:43 AM [facility]

## 2022-08-19 ENCOUNTER — Ambulatory Visit: Payer: 59 | Admitting: Nurse Practitioner

## 2022-08-19 ENCOUNTER — Encounter: Payer: Self-pay | Admitting: Nurse Practitioner

## 2022-08-19 VITALS — BP 124/68 | HR 58 | Temp 97.5°F | Ht 63.0 in | Wt 182.4 lb

## 2022-08-19 DIAGNOSIS — E559 Vitamin D deficiency, unspecified: Secondary | ICD-10-CM

## 2022-08-19 DIAGNOSIS — I700 Atherosclerosis of aorta: Secondary | ICD-10-CM | POA: Diagnosis not present

## 2022-08-19 DIAGNOSIS — E039 Hypothyroidism, unspecified: Secondary | ICD-10-CM

## 2022-08-19 DIAGNOSIS — E669 Obesity, unspecified: Secondary | ICD-10-CM

## 2022-08-19 DIAGNOSIS — I10 Essential (primary) hypertension: Secondary | ICD-10-CM

## 2022-08-19 DIAGNOSIS — Z79899 Other long term (current) drug therapy: Secondary | ICD-10-CM

## 2022-08-19 DIAGNOSIS — E1122 Type 2 diabetes mellitus with diabetic chronic kidney disease: Secondary | ICD-10-CM

## 2022-08-19 DIAGNOSIS — E785 Hyperlipidemia, unspecified: Secondary | ICD-10-CM | POA: Diagnosis not present

## 2022-08-19 DIAGNOSIS — R7309 Other abnormal glucose: Secondary | ICD-10-CM

## 2022-08-19 DIAGNOSIS — N183 Chronic kidney disease, stage 3 unspecified: Secondary | ICD-10-CM

## 2022-08-19 LAB — CBC WITH DIFFERENTIAL/PLATELET
Eosinophils Relative: 0.9 %
Hemoglobin: 13.2 g/dL (ref 11.7–15.5)
Lymphs Abs: 4307 cells/uL — ABNORMAL HIGH (ref 850–3900)
MPV: 11 fL (ref 7.5–12.5)
Monocytes Relative: 7.9 %
Neutro Abs: 3576 cells/uL (ref 1500–7800)
Neutrophils Relative %: 41.1 %
RBC: 4.46 10*6/uL (ref 3.80–5.10)

## 2022-08-19 MED ORDER — LEVOTHYROXINE SODIUM 150 MCG PO TABS
ORAL_TABLET | ORAL | 3 refills | Status: DC
Start: 2022-08-19 — End: 2022-12-15

## 2022-08-19 NOTE — Patient Instructions (Signed)

## 2022-08-20 LAB — COMPLETE METABOLIC PANEL WITH GFR
AG Ratio: 1.4 (calc) (ref 1.0–2.5)
ALT: 29 U/L (ref 6–29)
AST: 20 U/L (ref 10–35)
Albumin: 4.3 g/dL (ref 3.6–5.1)
Alkaline phosphatase (APISO): 51 U/L (ref 37–153)
BUN/Creatinine Ratio: 13 (calc) (ref 6–22)
BUN: 15 mg/dL (ref 7–25)
CO2: 29 mmol/L (ref 20–32)
Calcium: 9.8 mg/dL (ref 8.6–10.4)
Chloride: 105 mmol/L (ref 98–110)
Creat: 1.15 mg/dL — ABNORMAL HIGH (ref 0.50–1.05)
Globulin: 3 g/dL (calc) (ref 1.9–3.7)
Glucose, Bld: 118 mg/dL — ABNORMAL HIGH (ref 65–99)
Potassium: 4.3 mmol/L (ref 3.5–5.3)
Sodium: 141 mmol/L (ref 135–146)
Total Bilirubin: 0.5 mg/dL (ref 0.2–1.2)
Total Protein: 7.3 g/dL (ref 6.1–8.1)
eGFR: 54 mL/min/{1.73_m2} — ABNORMAL LOW (ref >=60)

## 2022-08-20 LAB — LIPID PANEL
Cholesterol: 225 mg/dL — ABNORMAL HIGH (ref <200)
HDL: 47 mg/dL — ABNORMAL LOW (ref >=50)
LDL Cholesterol (Calc): 152 mg/dL (calc) — ABNORMAL HIGH
Non-HDL Cholesterol (Calc): 178 mg/dL (calc) — ABNORMAL HIGH (ref <130)
Total CHOL/HDL Ratio: 4.8 (calc) (ref <5.0)
Triglycerides: 139 mg/dL (ref <150)

## 2022-08-20 LAB — CBC WITH DIFFERENTIAL/PLATELET
Absolute Monocytes: 687 cells/uL (ref 200–950)
Basophils Absolute: 52 cells/uL (ref 0–200)
Basophils Relative: 0.6 %
Eosinophils Absolute: 78 cells/uL (ref 15–500)
HCT: 38.8 % (ref 35.0–45.0)
MCH: 29.6 pg (ref 27.0–33.0)
MCHC: 34 g/dL (ref 32.0–36.0)
MCV: 87 fL (ref 80.0–100.0)
Platelets: 310 10*3/uL (ref 140–400)
RDW: 13.1 % (ref 11.0–15.0)
Total Lymphocyte: 49.5 %
WBC: 8.7 10*3/uL (ref 3.8–10.8)

## 2022-08-20 LAB — TSH: TSH: 2.32 mIU/L (ref 0.40–4.50)

## 2022-10-20 ENCOUNTER — Encounter: Payer: Self-pay | Admitting: Internal Medicine

## 2022-10-20 ENCOUNTER — Other Ambulatory Visit: Payer: Self-pay | Admitting: Nurse Practitioner

## 2022-10-20 DIAGNOSIS — R059 Cough, unspecified: Secondary | ICD-10-CM

## 2022-10-20 MED ORDER — PROMETHAZINE-DM 6.25-15 MG/5ML PO SYRP
5.0000 mL | ORAL_SOLUTION | Freq: Four times a day (QID) | ORAL | 1 refills | Status: DC | PRN
Start: 2022-10-20 — End: 2023-01-26

## 2022-12-15 ENCOUNTER — Other Ambulatory Visit: Payer: Self-pay | Admitting: Nurse Practitioner

## 2022-12-15 DIAGNOSIS — E039 Hypothyroidism, unspecified: Secondary | ICD-10-CM

## 2023-01-10 ENCOUNTER — Other Ambulatory Visit: Payer: Self-pay | Admitting: Nurse Practitioner

## 2023-01-10 DIAGNOSIS — Z1231 Encounter for screening mammogram for malignant neoplasm of breast: Secondary | ICD-10-CM

## 2023-01-15 IMAGING — MG MM DIGITAL SCREENING BILAT W/ TOMO AND CAD
8 series · 8 of 24 positions shown · non-contrast
Comparison: Previous exam(s).

CLINICAL DATA: Screening.

EXAM:
DIGITAL SCREENING BILATERAL MAMMOGRAM WITH TOMOSYNTHESIS AND CAD
TECHNIQUE: Bilateral screening digital craniocaudal and mediolateral oblique
mammograms were obtained. Bilateral screening digital breast
tomosynthesis was performed. The images were evaluated with
computer-aided detection.

[R CC synth-2D]
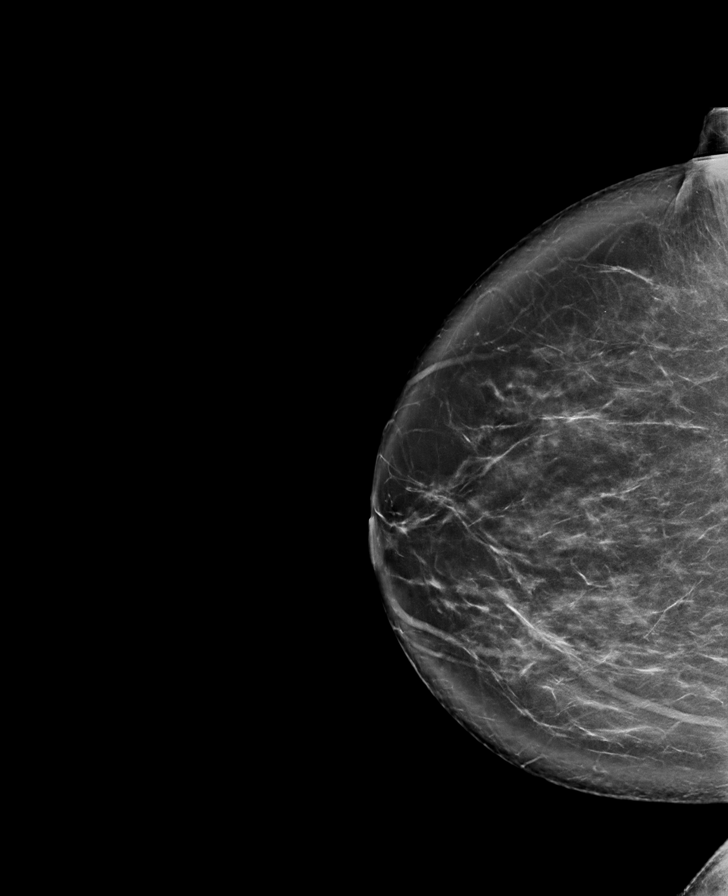

[L CC synth-2D]
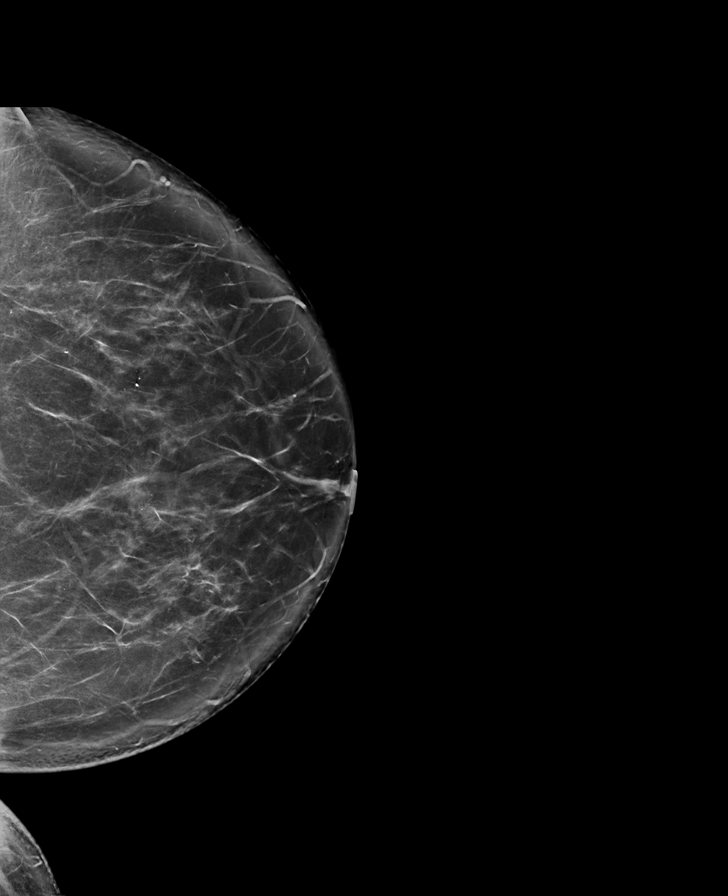

[R MLO synth-2D]
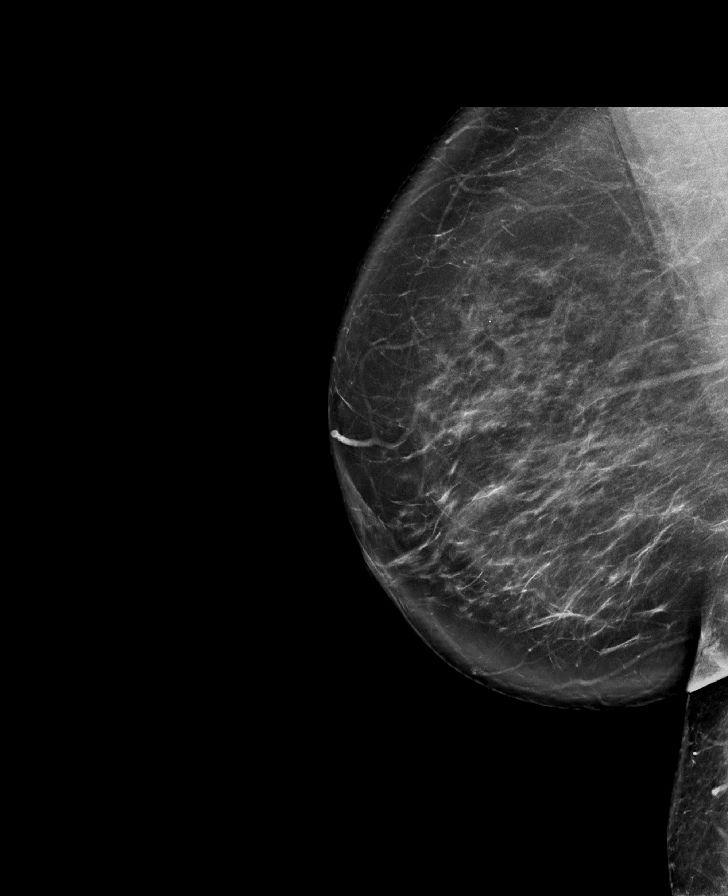

[L MLO synth-2D]
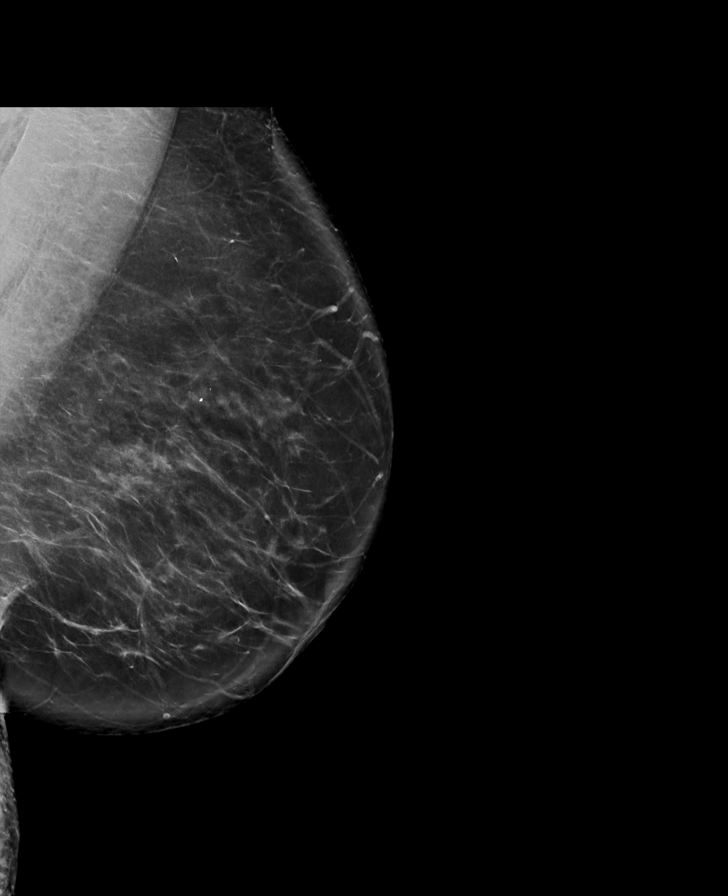

[L CC tomo · tomo slice 47/94.0]
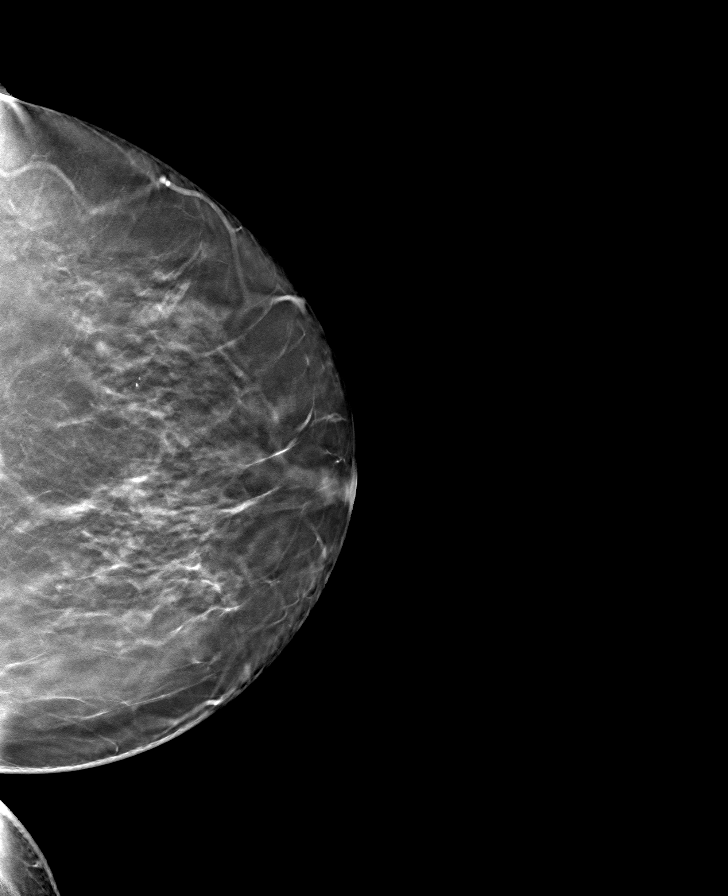

[L MLO tomo · tomo slice 51/102.0]
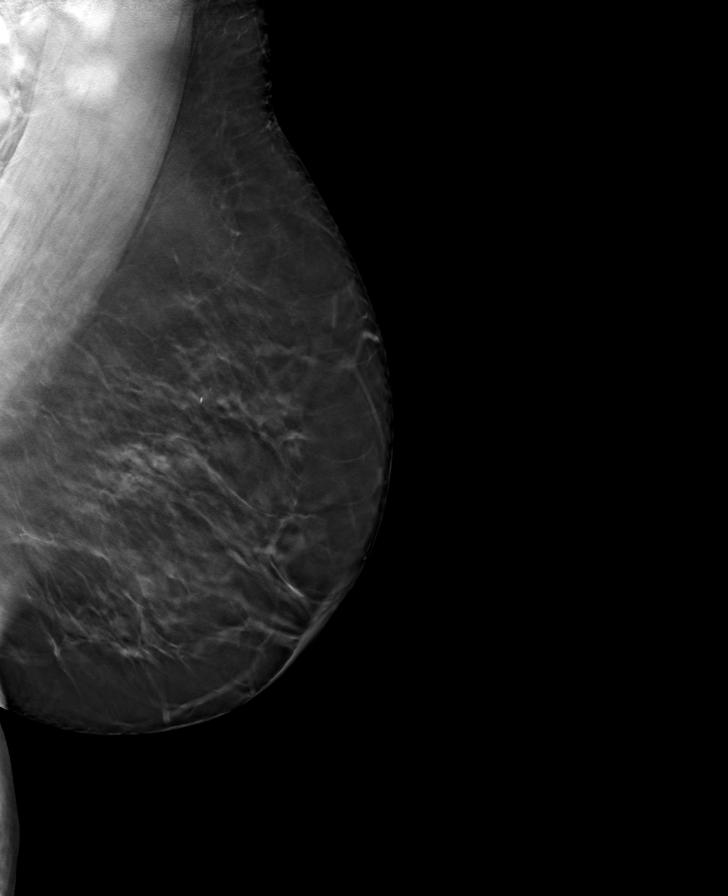

[R MLO tomo · tomo slice 51/100.0]
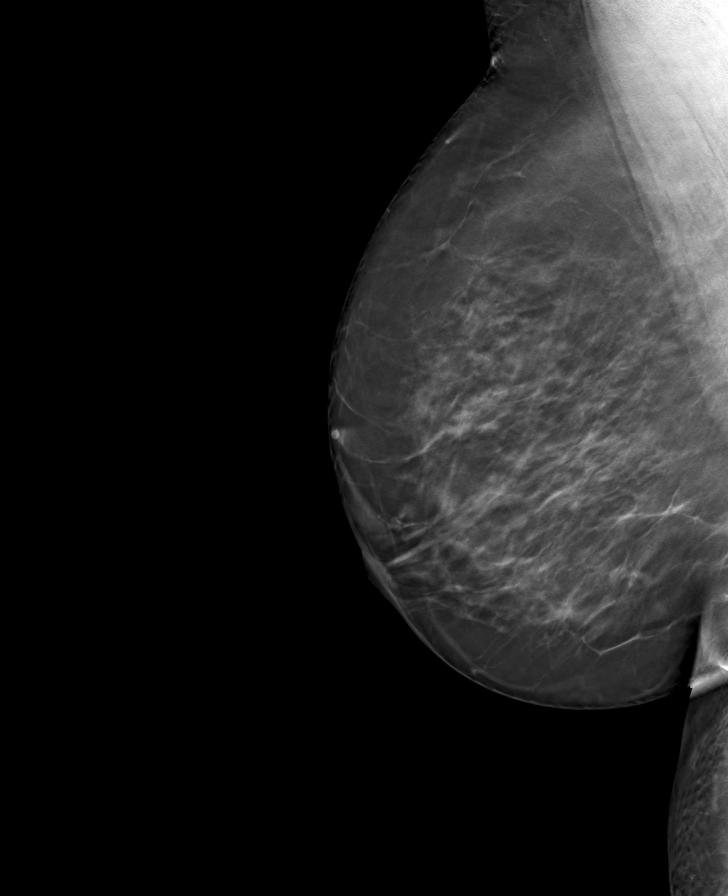

[R CC tomo · tomo slice 49/97.0]
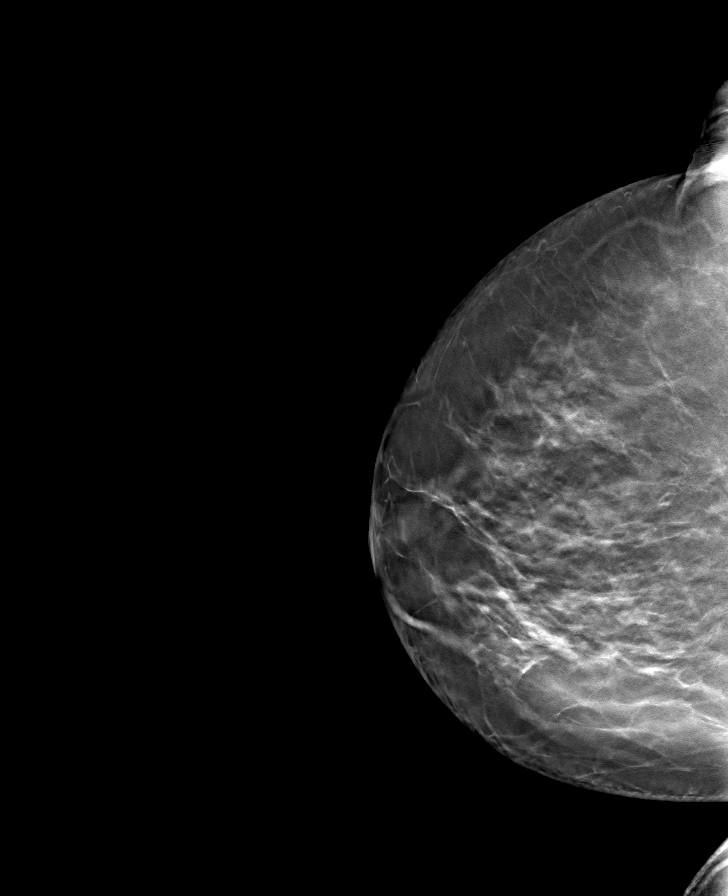

[8 of 24 positions shown; findings below may reference images not displayed]

ACR Breast Density Category b: There are scattered areas of
fibroglandular density.
FINDINGS: There are no findings suspicious for malignancy.
IMPRESSION: No mammographic evidence of malignancy. A result letter of this
screening mammogram will be mailed directly to the patient.

RECOMMENDATION:
Screening mammogram in one year. (Code:51-O-LD2)

BI-RADS CATEGORY  1: Negative.

## 2023-01-25 ENCOUNTER — Ambulatory Visit: Payer: 59 | Admitting: Nurse Practitioner

## 2023-01-26 ENCOUNTER — Ambulatory Visit: Payer: 59 | Admitting: Nurse Practitioner

## 2023-01-26 ENCOUNTER — Encounter: Payer: Self-pay | Admitting: Nurse Practitioner

## 2023-01-26 VITALS — BP 118/60 | HR 72 | Temp 97.9°F | Ht 63.0 in | Wt 184.6 lb

## 2023-01-26 DIAGNOSIS — E66811 Obesity, class 1: Secondary | ICD-10-CM

## 2023-01-26 DIAGNOSIS — I10 Essential (primary) hypertension: Secondary | ICD-10-CM | POA: Diagnosis not present

## 2023-01-26 DIAGNOSIS — E039 Hypothyroidism, unspecified: Secondary | ICD-10-CM

## 2023-01-26 DIAGNOSIS — E559 Vitamin D deficiency, unspecified: Secondary | ICD-10-CM

## 2023-01-26 DIAGNOSIS — I700 Atherosclerosis of aorta: Secondary | ICD-10-CM

## 2023-01-26 DIAGNOSIS — M7552 Bursitis of left shoulder: Secondary | ICD-10-CM

## 2023-01-26 DIAGNOSIS — Z79899 Other long term (current) drug therapy: Secondary | ICD-10-CM

## 2023-01-26 DIAGNOSIS — E785 Hyperlipidemia, unspecified: Secondary | ICD-10-CM

## 2023-01-26 DIAGNOSIS — N1831 Chronic kidney disease, stage 3a: Secondary | ICD-10-CM

## 2023-01-26 DIAGNOSIS — R7309 Other abnormal glucose: Secondary | ICD-10-CM

## 2023-01-26 DIAGNOSIS — M545 Low back pain, unspecified: Secondary | ICD-10-CM

## 2023-01-26 NOTE — Patient Instructions (Signed)

## 2023-01-26 NOTE — Progress Notes (Signed)
6 MONTH FOLLOW UP  Assessment and Plan:  Hypothyroidism, unspecified hypothyroidism type Hypothyroidism-check TSH level, continue medications the same, reminded to take on an empty stomach 30-15mins before food.  - TSH  Essential Hypertension - continue medications- Amlodipine 5 mg , DASH diet, exercise and monitor at home. Call if greater than 130/80.  - CBC, CMP  Stage 3a CKD(HCC) Increase fluids, avoid NSAIDS, monitor sugars, will monitor   Aortic Atherosclerosis(HCC) Continue to monitor blood pressure, blood sugars, cholesterol ans weight  Hyperlipidemia - She was taken off of Atorvastatin 40 mg by cardiologist, continues on Zetia 10 mg QD -  decrease fatty foods, increase activity.  - Lipid panel  Abnormal Glucose Continue weight loss Discussed general issues about diabetes pathophysiology and management.  Agricultural engineer distributed.  Encouraged aerobic exercise. -A1c  Vitamin D deficiency At goal at recent check; continue to recommend supplementation for goal of 60-100 Defer vitamin D level  Obesity Long discussion about weight loss, diet, and exercise Recommended diet heavy in fruits and veggies and low in animal meats, cheeses, and dairy products, appropriate calorie intake Patient will work on decreasing saturated fats and simple carbs.  Increase lean protein and activity as tolerated Follow up at next visit  Medication management -     CBC with Differential/Platelet -     COMPLETE METABOLIC PANEL WITH GFR -     Lipid panel -     Magnesium -     Hemoglobin A1C w/out eAG -     TSH  Acute bursitis of left shoulder Continue to follow with Dr. Noel Gerold Scheduled for injection on 02/03/23  Lumbar pain Continue to follow with spine and scoliosis specialists     Discussed med's effects and SE's. Screening labs and tests as requested with regular follow-up as recommended. Over 40 minutes of exam, counseling, chart review, and complex, high level critical  decision making was performed this visit.   Future Appointments  Date Time Provider Department Center  02/15/2023 11:10 AM [facility]  05/05/2023 10:00 AM Raynelle Dick, NP [facility] None     HPI  62 y.o. AA female  presents for a 3 month follow up for htn, hyperlipidemia, prediabetes, obesity, hypothyroid, vitamin D def.    Pt states she has been having pain in left shoulder and is seeing orthopedics, due for injection on 02/03/23.  She is having a catching in lower back when going from sitting to standing.  She is following with [facility]. She is followed by Dr. Noel Gerold.  BMI is Body mass index is 32.7 kg/m., she has been working on diet and exercise. She has been eating more salads, doing grilled fish, beans. Currently doing limited exercise due to pain. Wt Readings from Last 3 Encounters:  01/26/23 184 lb 9.6 oz (83.7 kg)  08/19/22 182 lb 6.4 oz (82.7 kg)  08/05/22 182 lb 6.4 oz (82.7 kg)   Her blood pressure has been controlled at home with use of Amlodipine 5 mg QD, today their BP is BP: 118/60 BP Readings from Last 3 Encounters:  01/26/23 118/60  08/19/22 124/68  08/05/22 128/78  She does workout, was walking daily at work until got cold. Denies chest pain, dyspnea, dizziness.   Had low risk coronary CT calcium score of 8 on 06/02/2020 She was on Atorvastatin 40 mg but was d/c'd by cardiologist due to muscle pain .  Has appointment with pharmacist at their office to discuss medication.  The pain did  resolve. Currently on Zetia 10 mg QD  Her cholesterol is not at goal. The cholesterol last visit was:   Lab Results  Component Value Date   CHOL 225 (H) 08/19/2022   HDL 47 (L) 08/19/2022   LDLCALC 152 (H) 08/19/2022   TRIG 139 08/19/2022   CHOLHDL 4.8 08/19/2022   She has been working on diet and exercise for prediabetes, and denies paresthesia of the feet, polydipsia, polyuria and visual disturbances. Last A1C in the office was:  Lab  Results  Component Value Date   HGBA1C 6.3 (H) 05/02/2022   She has not been drinking much water, but more than normal Last GFR: Lab Results  Component Value Date   EGFR 54 (L) 08/19/2022    Patient is on Vitamin D supplement, she is on 5000 units. Lab Results  Component Value Date   VD25OH 51 05/02/2022     She is on thyroid medication. Her medication was not changed last visit, she is on Levothyroxine 150 mcg  1 pill Monday- Thursday, none on F,S,S. She is on a biotin supplement.  Lab Results  Component Value Date   TSH 2.32 08/19/2022      Current Medications:   Current Outpatient Medications (Endocrine & Metabolic):    levothyroxine (SYNTHROID) 150 MCG tablet, TAKE 1 TABLET DAILY ON AN EMPTY STOMACH WITH ONLY WATER FOR 30 MINUTES & NO ANTACID MEDS, CALCIUM OR MAGNESIUM FOR 4 HOURS & AVOID BIOTIN  Current Outpatient Medications (Cardiovascular):    amLODipine (NORVASC) 5 MG tablet, Take 1 tablet (5 mg total) by mouth daily.   ezetimibe (ZETIA) 10 MG tablet, Take 1 tablet (10 mg total) by mouth daily.  Current Outpatient Medications (Respiratory):    promethazine-dextromethorphan (PROMETHAZINE-DM) 6.25-15 MG/5ML syrup, Take 5 mLs by mouth 4 (four) times daily as needed for cough. (Patient not taking: Reported on 01/26/2023)   Current Outpatient Medications (Hematological):    vitamin B-12 (CYANOCOBALAMIN) 100 MCG tablet, Take 100 mcg by mouth daily.  Current Outpatient Medications (Other):    BIOTIN PO, Take by mouth.   Cholecalciferol (VITAMIN D3) 5000 UNITS TABS, Take 5,000 Units by mouth daily.   COLLAGEN PO, Take by mouth.   Flaxseed, Linseed, (FLAX SEED OIL PO), Take by mouth.   Multiple Vitamins-Minerals (HAIR/SKIN/NAILS PO), Take by mouth daily.  Medical History:  Past Medical History:  Diagnosis Date   Abnormal MRI    Colon polyp    Hot flashes    Hypercholesteremia    Hypothyroid    Memory loss    Osteoarthritis    Allergies Allergies  Allergen  Reactions   Cephalexin     Throat swelling    SURGICAL HISTORY She  has a past surgical history that includes Cesarean section (80 & 82) and Vaginal hysterectomy (2002). FAMILY HISTORY Her family history includes Breast cancer in her maternal aunt, maternal aunt, and paternal aunt; Cancer in her mother, paternal aunt, and paternal aunt; Dementia in her father; Diabetes in her father and mother; Heart disease in her father and mother; Hypertension in her father and mother; Uterine cancer in her paternal aunt. SOCIAL HISTORY She  reports that she has never smoked. She has never used smokeless tobacco. She reports that she does not drink alcohol and does not use drugs.   Review of Systems: Review of Systems  Constitutional:  Negative for diaphoresis, malaise/fatigue and weight loss.  HENT: Negative.  Negative for hearing loss and tinnitus.   Eyes: Negative.  Negative for blurred vision and double vision.  Respiratory:  Negative for cough, hemoptysis, sputum production, shortness of breath and wheezing.   Cardiovascular:  Negative for chest pain, palpitations, orthopnea, claudication, leg swelling and PND.  Gastrointestinal: Negative.  Negative for abdominal pain, blood in stool, constipation, diarrhea, heartburn, melena, nausea and vomiting.  Genitourinary: Negative.   Musculoskeletal:  Positive for back pain (low back intermittent) and joint pain (hips intermittent, left shoulder). Negative for myalgias.  Skin: Negative.  Negative for rash.  Neurological: Negative.  Negative for dizziness, tingling, sensory change, weakness and headaches.  Endo/Heme/Allergies: Negative.  Negative for polydipsia.  Psychiatric/Behavioral: Negative.    All other systems reviewed and are negative.   Physical Exam: Estimated body mass index is 32.7 kg/m as calculated from the following:   Height as of this encounter: 5\' 3"  (1.6 m).   Weight as of this encounter: 184 lb 9.6 oz (83.7 kg). BP 118/60   Pulse  72   Temp 97.9 F (36.6 C)   Ht 5\' 3"  (1.6 m)   Wt 184 lb 9.6 oz (83.7 kg)   SpO2 98%   BMI 32.70 kg/m  General Appearance: Well nourished, in no apparent distress.  Eyes: PERRLA, EOMs, conjunctiva no swelling or erythema Sinuses: No Frontal/maxillary tenderness  ENT/Mouth: Ext aud canals clear, normal light reflex with TMs without erythema, bulging. Good dentition. No erythema, swelling, or exudate on post pharynx.  Hearing normal.  Neck: Supple, thyroid normal.  Respiratory: Respiratory effort normal, BS equal bilaterally without rales, rhonchi, wheezing or stridor.  Cardio: RRR without murmurs, rubs or gallops. Brisk peripheral pulses without edema.  Chest: symmetric, with normal excursions and percussion.  Abdomen: Soft, + BS Musculoskeletal: Full ROM all peripheral extremities,5/5 strength, and normal gait. No left shoulder pain on exam today Skin: Warm, dry . Multiple moles on face  Neuro: Cranial nerves intact, reflexes equal bilaterally. Normal muscle tone, no cerebellar symptoms. Sensation intact.  Psych: Awake and oriented X 3, normal affect, Insight and Judgment appropriate.     Vicki Wright  9:54 AM [facility]

## 2023-01-27 LAB — COMPLETE METABOLIC PANEL WITH GFR
AG Ratio: 1.5 (calc) (ref 1.0–2.5)
ALT: 40 U/L — ABNORMAL HIGH (ref 6–29)
AST: 24 U/L (ref 10–35)
Albumin: 4.5 g/dL (ref 3.6–5.1)
Alkaline phosphatase (APISO): 53 U/L (ref 37–153)
BUN/Creatinine Ratio: 9 (calc) (ref 6–22)
BUN: 10 mg/dL (ref 7–25)
CO2: 30 mmol/L (ref 20–32)
Calcium: 9.7 mg/dL (ref 8.6–10.4)
Chloride: 104 mmol/L (ref 98–110)
Creat: 1.1 mg/dL — ABNORMAL HIGH (ref 0.50–1.05)
Globulin: 3.1 g/dL (ref 1.9–3.7)
Glucose, Bld: 126 mg/dL — ABNORMAL HIGH (ref 65–99)
Potassium: 4.5 mmol/L (ref 3.5–5.3)
Sodium: 141 mmol/L (ref 135–146)
Total Bilirubin: 0.5 mg/dL (ref 0.2–1.2)
Total Protein: 7.6 g/dL (ref 6.1–8.1)
eGFR: 56 mL/min/{1.73_m2} — ABNORMAL LOW (ref >=60)

## 2023-01-27 LAB — CBC WITH DIFFERENTIAL/PLATELET
Absolute Lymphocytes: 4188 {cells}/uL — ABNORMAL HIGH (ref 850–3900)
Absolute Monocytes: 591 {cells}/uL (ref 200–950)
Basophils Absolute: 41 {cells}/uL (ref 0–200)
Basophils Relative: 0.5 %
Eosinophils Absolute: 81 {cells}/uL (ref 15–500)
Eosinophils Relative: 1 %
HCT: 40.2 % (ref 35.0–45.0)
Hemoglobin: 13.2 g/dL (ref 11.7–15.5)
MCH: 29.1 pg (ref 27.0–33.0)
MCHC: 32.8 g/dL (ref 32.0–36.0)
MCV: 88.5 fL (ref 80.0–100.0)
MPV: 11 fL (ref 7.5–12.5)
Monocytes Relative: 7.3 %
Neutro Abs: 3200 {cells}/uL (ref 1500–7800)
Neutrophils Relative %: 39.5 %
Platelets: 330 10*3/uL (ref 140–400)
RBC: 4.54 10*6/uL (ref 3.80–5.10)
RDW: 13.1 % (ref 11.0–15.0)
Total Lymphocyte: 51.7 %
WBC: 8.1 10*3/uL (ref 3.8–10.8)

## 2023-01-27 LAB — LIPID PANEL
Cholesterol: 237 mg/dL — ABNORMAL HIGH (ref <200)
HDL: 54 mg/dL (ref >=50)
LDL Cholesterol (Calc): 157 mg/dL — ABNORMAL HIGH
Non-HDL Cholesterol (Calc): 183 mg/dL — ABNORMAL HIGH (ref <130)
Total CHOL/HDL Ratio: 4.4 (calc) (ref <5.0)
Triglycerides: 132 mg/dL (ref <150)

## 2023-01-27 LAB — TSH: TSH: 3.2 m[IU]/L (ref 0.40–4.50)

## 2023-01-27 LAB — HEMOGLOBIN A1C W/OUT EAG: Hgb A1c MFr Bld: 6.3 %{Hb} — ABNORMAL HIGH (ref <5.7)

## 2023-01-27 LAB — MAGNESIUM: Magnesium: 2.1 mg/dL (ref 1.5–2.5)

## 2023-02-15 ENCOUNTER — Ambulatory Visit: Payer: 59

## 2023-05-05 ENCOUNTER — Encounter: Payer: 59 | Admitting: Nurse Practitioner

## 2023-05-23 ENCOUNTER — Encounter: Payer: 59 | Admitting: Nurse Practitioner

## 2023-09-29 ENCOUNTER — Ambulatory Visit
Admission: RE | Admit: 2023-09-29 | Discharge: 2023-09-29 | Disposition: A | Discharge status: Home / Self Care | Source: Ambulatory Visit | Attending: Nurse Practitioner | Admitting: Nurse Practitioner

## 2023-09-29 ENCOUNTER — Other Ambulatory Visit: Payer: Self-pay | Admitting: Physician Assistant

## 2023-09-29 DIAGNOSIS — Z1231 Encounter for screening mammogram for malignant neoplasm of breast: Secondary | ICD-10-CM

## 2023-10-04 ENCOUNTER — Other Ambulatory Visit: Payer: Self-pay | Admitting: Physician Assistant

## 2023-10-04 DIAGNOSIS — R928 Other abnormal and inconclusive findings on diagnostic imaging of breast: Secondary | ICD-10-CM

## 2023-10-10 ENCOUNTER — Ambulatory Visit
Admission: RE | Admit: 2023-10-10 | Discharge: 2023-10-10 | Disposition: A | Discharge status: Home / Self Care | Source: Ambulatory Visit | Attending: Physician Assistant | Admitting: Physician Assistant

## 2023-10-10 DIAGNOSIS — R928 Other abnormal and inconclusive findings on diagnostic imaging of breast: Secondary | ICD-10-CM

## 2023-10-11 ENCOUNTER — Other Ambulatory Visit: Payer: Self-pay | Admitting: Physician Assistant

## 2023-10-11 DIAGNOSIS — R921 Mammographic calcification found on diagnostic imaging of breast: Secondary | ICD-10-CM

## 2024-01-11 DIAGNOSIS — E7800 Pure hypercholesterolemia, unspecified: Secondary | ICD-10-CM | POA: Diagnosis not present

## 2024-02-05 DIAGNOSIS — H2513 Age-related nuclear cataract, bilateral: Secondary | ICD-10-CM | POA: Diagnosis not present

## 2024-02-05 DIAGNOSIS — H40023 Open angle with borderline findings, high risk, bilateral: Secondary | ICD-10-CM | POA: Diagnosis not present

## 2024-02-05 DIAGNOSIS — H43813 Vitreous degeneration, bilateral: Secondary | ICD-10-CM | POA: Diagnosis not present

## 2024-02-05 DIAGNOSIS — H35371 Puckering of macula, right eye: Secondary | ICD-10-CM | POA: Diagnosis not present

## 2024-02-05 DIAGNOSIS — H40033 Anatomical narrow angle, bilateral: Secondary | ICD-10-CM | POA: Diagnosis not present

## 2024-02-05 DIAGNOSIS — H0102B Squamous blepharitis left eye, upper and lower eyelids: Secondary | ICD-10-CM | POA: Diagnosis not present

## 2024-02-05 DIAGNOSIS — H0102A Squamous blepharitis right eye, upper and lower eyelids: Secondary | ICD-10-CM | POA: Diagnosis not present

## 2024-04-23 ENCOUNTER — Other Ambulatory Visit: Payer: Self-pay | Admitting: Physician Assistant

## 2024-04-23 ENCOUNTER — Ambulatory Visit
Admission: RE | Admit: 2024-04-23 | Discharge: 2024-04-23 | Disposition: A | Source: Ambulatory Visit | Attending: Physician Assistant | Admitting: Physician Assistant

## 2024-04-23 DIAGNOSIS — R928 Other abnormal and inconclusive findings on diagnostic imaging of breast: Secondary | ICD-10-CM

## 2024-04-23 DIAGNOSIS — R921 Mammographic calcification found on diagnostic imaging of breast: Secondary | ICD-10-CM

## 2024-05-09 ENCOUNTER — Other Ambulatory Visit (HOSPITAL_COMMUNITY): Payer: Self-pay

## 2024-05-09 ENCOUNTER — Ambulatory Visit: Payer: Self-pay | Admitting: Internal Medicine

## 2024-05-09 VITALS — BP 164/80 | HR 63 | Ht 63.0 in | Wt 184.3 lb

## 2024-05-09 DIAGNOSIS — I517 Cardiomegaly: Secondary | ICD-10-CM | POA: Diagnosis not present

## 2024-05-09 DIAGNOSIS — E785 Hyperlipidemia, unspecified: Secondary | ICD-10-CM

## 2024-05-09 DIAGNOSIS — E78019 Familial hypercholesterolemia, unspecified: Secondary | ICD-10-CM | POA: Diagnosis not present

## 2024-05-09 DIAGNOSIS — I10 Essential (primary) hypertension: Secondary | ICD-10-CM

## 2024-05-09 DIAGNOSIS — R9431 Abnormal electrocardiogram [ECG] [EKG]: Secondary | ICD-10-CM

## 2024-05-09 DIAGNOSIS — Z79899 Other long term (current) drug therapy: Secondary | ICD-10-CM

## 2024-05-09 DIAGNOSIS — R079 Chest pain, unspecified: Secondary | ICD-10-CM

## 2024-05-09 MED ORDER — AMLODIPINE BESYLATE 5 MG PO TABS
5.0000 mg | ORAL_TABLET | Freq: Every day | ORAL | 3 refills | Status: AC
  Filled 2024-05-09: qty 90, 90d supply, fill #0

## 2024-05-09 NOTE — Progress Notes (Signed)
 " Cardiology Office Note:  .   Date:  05/09/2024  ID:  Vicki Wright, DOB 03-May-1960, MRN [id number] PCP: Tonita Fallow, MD (Inactive)  [facility] Providers Cardiologist:  Soyla DELENA Merck, MD    History of Present Illness: .   Vicki Wright is a 64 y.o. female.  Discussed the use of AI scribe software for clinical note transcription with the patient, who gave verbal consent to proceed.  History of Present Illness Vicki Wright is a 64 year old female with hyperlipidemia and prediabetes who presents for cardiovascular follow-up.  Two years ago she had chest pain and a coronary CTA on June 02, 2020 showed minimal coronary artery disease with a coronary calcium  score of 8. An echocardiogram in May 2023 showed a hyperdynamic ventricle and mild left ventricular hypertrophy.  She was on Zetia  10 mg daily with LDL 97. She was recently resumed Crestor  (rosuvastatin ) but developed muscle weakness and stopped it this week. She recalls combination Zetia  and Crestor  in December with improved cholesterol. She is now taking Zetia  only and has stopped Crestor  due to muscle aches. She is using dietary turmeric and honey which is fine if consumed as part of diet.  She was prescribed amlodipine  5 mg daily for hypertension but has not taken it for a long time. Her blood pressure was high today. She does not recall a specific adverse effect such as leg swelling that led her to stop amlodipine .  She has chest pain described as a straining sensation when lying on her left side. It has not improved since stopping Crestor . It does not improve with Tylenol or ibuprofen and is relieved by changing position.      ROS: negative except per HPI above.  Studies Reviewed: SABRA   EKG Interpretation Date/Time:  Thursday May 09 2024 08:38:34 EST Ventricular Rate:  63 PR Interval:  158 QRS Duration:  70 QT Interval:  410 QTC Calculation: 419 R Axis:   55  Text Interpretation: Normal sinus  rhythm Nonspecific T wave abnormality When compared with ECG of 22-Jan-2001 08:54, Nonspecific T wave abnormality now evident in Lateral leads Confirmed by Rees Santistevan ([id number]) on 05/09/2024 8:41:10 AM    Results Labs Cholesterol panel (03/2024): Within normal limits  Radiology Coronary CTA (06/02/2020): Minimal coronary artery disease; coronary calcium  score 8  Diagnostic Echocardiogram (08/2021): Hyperdynamic left ventricle; mild left ventricular hypertrophy; no wall motion abnormalities EKG: Minor abnormality, unchanged from prior; overall normal (Independently interpreted) Risk Assessment/Calculations:       Physical Exam:   VS:  BP (!) 164/80 (BP Location: Right Arm, Patient Position: Sitting, Cuff Size: Large)   Pulse 63   Ht 5' 3 (1.6 m)   Wt 184 lb 4.8 oz (83.6 kg)   SpO2 97%   BMI 32.65 kg/m    Wt Readings from Last 3 Encounters:  05/09/24 184 lb 4.8 oz (83.6 kg)  01/26/23 184 lb 9.6 oz (83.7 kg)  08/19/22 182 lb 6.4 oz (82.7 kg)     Physical Exam VITALS: BP- 164/80 GENERAL: Alert, cooperative, well developed, no acute distress HEENT: Normocephalic, normal oropharynx, moist mucous membranes CHEST: Clear to auscultation bilaterally, no wheezes, rhonchi, or crackles CARDIOVASCULAR: Normal heart rate and rhythm, S1 and S2 normal without murmurs ABDOMEN: Soft, non-tender, non-distended, without organomegaly, normal bowel sounds EXTREMITIES: No cyanosis or edema NEUROLOGICAL: Cranial nerves grossly intact, moves all extremities without gross motor or sensory deficit   ASSESSMENT AND PLAN: .    Assessment and Plan Assessment &  Plan Primary hypertension Medication management Blood pressure elevated at 164/80 mmHg. Previously controlled on amlodipine . Goal is 120/70 mmHg. Untreated high blood pressure can lead to cardiac remodeling. - Restarted amlodipine  5 mg daily. - Called in prescription for amlodipine  to [facility] pharmacy. - Monitor blood pressure at  home.  Hyperlipidemia Previously on rosuvastatin , discontinued due to muscle weakness. Currently on ezetimibe  10 mg daily. Cholesterol levels were excellent on combination therapy. Discussed CoQ10 supplementation for muscle aches. - Continue ezetimibe  10 mg daily. - Consider lower potency statin such as pravastatin, simvastatin  or lovastatin if cholesterol levels rise. - Recheck cholesterol levels in May with PCP per patient preference. I offered to resume crestor  at lower dose today with coq10 and she would rather observe off of statin and recheck labs in May.   Chest pain of uncertain etiology Intermittent chest pain possibly related to high blood pressure. Previous cardiac evaluations reassuring. Pain may be due to elevated blood pressure causing cardiac strain. - Monitor chest pain and blood pressure control. - Consider further cardiac evaluation if pain persists despite blood pressure control.        Soyla Merck, MD, [facility] "

## 2024-05-09 NOTE — Patient Instructions (Signed)
 Medication Instructions:  Start: Amlodipine  (Norvasc ) 5 mg, one tablet, once daily  Lab Work: None  Follow-Up: At [facility], you and your health needs are our priority.  As part of our continuing mission to provide you with exceptional heart care, our providers are all part of one team.  This team includes your primary Cardiologist (physician) and Advanced Practice Providers or APPs (Physician Assistants and Nurse Practitioners) who all work together to provide you with the care you need, when you need it.  Your next appointment:   1 year(s)  Provider:   Either Dr. Loni or an APP:  One of our Advanced Practice Providers (APPs): Morse Clause, PA-C  Hanh Waddell Daniels, PA-C  Saddie Cleaves, NP  Olivia Pavy, PA-C Miriam Shams, NP  Leontine Salen, PA-C Josefa Beauvais, NP  [facility], PA-C [place], PA-C  [place], PA-C [place], [place]  Damien Braver, NP Jon Hails, PA-C  Waddell Donath, PA-C Dayna Dunn, PA-C  [place], PA-C [place], NP Glendia Ferrier, PA-C Callie Goodrich, PA-C  Katlyn West, NP Thom Sluder, PA-C  Alyssa White, NP Rollo Louder, PA-C Xika Zhao, NP    Lamarr Satterfield, NP

## 2024-10-22 ENCOUNTER — Encounter
